# Patient Record
Sex: Male | Born: 1940 | Race: White | Hispanic: No | Marital: Single | Smoking: Former smoker
Health system: Southern US, Community
[De-identification: ages and names within clinical notes are randomized; demographics above are authoritative.]

## PROBLEM LIST (undated history)

## (undated) DIAGNOSIS — J9611 Chronic respiratory failure with hypoxia: Secondary | ICD-10-CM

## (undated) DIAGNOSIS — I5032 Chronic diastolic (congestive) heart failure: Secondary | ICD-10-CM

## (undated) DIAGNOSIS — I2781 Cor pulmonale (chronic): Secondary | ICD-10-CM

## (undated) DIAGNOSIS — G5601 Carpal tunnel syndrome, right upper limb: Secondary | ICD-10-CM

## (undated) DIAGNOSIS — K5909 Other constipation: Secondary | ICD-10-CM

## (undated) DIAGNOSIS — H02839 Dermatochalasis of unspecified eye, unspecified eyelid: Secondary | ICD-10-CM

## (undated) DIAGNOSIS — M79606 Pain in leg, unspecified: Secondary | ICD-10-CM

## (undated) DIAGNOSIS — E876 Hypokalemia: Secondary | ICD-10-CM

## (undated) DIAGNOSIS — R635 Abnormal weight gain: Secondary | ICD-10-CM

## (undated) DIAGNOSIS — M6281 Muscle weakness (generalized): Secondary | ICD-10-CM

## (undated) DIAGNOSIS — I272 Pulmonary hypertension, unspecified: Secondary | ICD-10-CM

## (undated) DIAGNOSIS — G2581 Restless legs syndrome: Secondary | ICD-10-CM

## (undated) DIAGNOSIS — G4733 Obstructive sleep apnea (adult) (pediatric): Secondary | ICD-10-CM

## (undated) DIAGNOSIS — I890 Lymphedema, not elsewhere classified: Secondary | ICD-10-CM

## (undated) DIAGNOSIS — H5203 Hypermetropia, bilateral: Secondary | ICD-10-CM

## (undated) DIAGNOSIS — E559 Vitamin D deficiency, unspecified: Secondary | ICD-10-CM

## (undated) DIAGNOSIS — R269 Unspecified abnormalities of gait and mobility: Secondary | ICD-10-CM

## (undated) DIAGNOSIS — I110 Hypertensive heart disease with heart failure: Secondary | ICD-10-CM

## (undated) DIAGNOSIS — K567 Ileus, unspecified: Secondary | ICD-10-CM

## (undated) DIAGNOSIS — Z89421 Acquired absence of other right toe(s): Secondary | ICD-10-CM

## (undated) DIAGNOSIS — H43812 Vitreous degeneration, left eye: Secondary | ICD-10-CM

## (undated) DIAGNOSIS — I872 Venous insufficiency (chronic) (peripheral): Secondary | ICD-10-CM

## (undated) DIAGNOSIS — E785 Hyperlipidemia, unspecified: Secondary | ICD-10-CM

## (undated) DIAGNOSIS — I350 Nonrheumatic aortic (valve) stenosis: Secondary | ICD-10-CM

## (undated) DIAGNOSIS — R54 Age-related physical debility: Secondary | ICD-10-CM

## (undated) DIAGNOSIS — F339 Major depressive disorder, recurrent, unspecified: Secondary | ICD-10-CM

## (undated) DIAGNOSIS — L821 Other seborrheic keratosis: Secondary | ICD-10-CM

## (undated) DIAGNOSIS — M1990 Unspecified osteoarthritis, unspecified site: Secondary | ICD-10-CM

## (undated) DIAGNOSIS — I480 Paroxysmal atrial fibrillation: Secondary | ICD-10-CM

## (undated) DIAGNOSIS — Z87891 Personal history of nicotine dependence: Secondary | ICD-10-CM

## (undated) DIAGNOSIS — H25813 Combined forms of age-related cataract, bilateral: Secondary | ICD-10-CM

## (undated) DIAGNOSIS — E039 Hypothyroidism, unspecified: Secondary | ICD-10-CM

## (undated) DIAGNOSIS — E662 Morbid (severe) obesity with alveolar hypoventilation: Secondary | ICD-10-CM

## (undated) HISTORY — DX: Nonrheumatic aortic (valve) stenosis: I35.0

## (undated) HISTORY — DX: Lymphedema, not elsewhere classified: I89.0

---

## 2007-04-24 DIAGNOSIS — N400 Enlarged prostate without lower urinary tract symptoms: Secondary | ICD-10-CM | POA: Insufficient documentation

## 2008-08-08 DIAGNOSIS — I10 Essential (primary) hypertension: Secondary | ICD-10-CM | POA: Insufficient documentation

## 2009-05-27 DIAGNOSIS — K219 Gastro-esophageal reflux disease without esophagitis: Secondary | ICD-10-CM | POA: Insufficient documentation

## 2012-01-26 DIAGNOSIS — G4733 Obstructive sleep apnea (adult) (pediatric): Secondary | ICD-10-CM | POA: Insufficient documentation

## 2012-09-18 DIAGNOSIS — M179 Osteoarthritis of knee, unspecified: Secondary | ICD-10-CM | POA: Insufficient documentation

## 2013-01-19 DIAGNOSIS — M1611 Unilateral primary osteoarthritis, right hip: Secondary | ICD-10-CM | POA: Insufficient documentation

## 2015-08-15 DIAGNOSIS — D472 Monoclonal gammopathy: Secondary | ICD-10-CM | POA: Insufficient documentation

## 2015-10-29 DIAGNOSIS — I2510 Atherosclerotic heart disease of native coronary artery without angina pectoris: Secondary | ICD-10-CM | POA: Insufficient documentation

## 2016-04-19 DIAGNOSIS — I83019 Varicose veins of right lower extremity with ulcer of unspecified site: Secondary | ICD-10-CM | POA: Insufficient documentation

## 2016-05-01 DIAGNOSIS — I452 Bifascicular block: Secondary | ICD-10-CM | POA: Insufficient documentation

## 2016-05-02 DIAGNOSIS — I872 Venous insufficiency (chronic) (peripheral): Secondary | ICD-10-CM | POA: Insufficient documentation

## 2016-11-09 DIAGNOSIS — G8929 Other chronic pain: Secondary | ICD-10-CM | POA: Insufficient documentation

## 2017-03-06 DIAGNOSIS — E662 Morbid (severe) obesity with alveolar hypoventilation: Secondary | ICD-10-CM | POA: Insufficient documentation

## 2017-09-23 DIAGNOSIS — R5381 Other malaise: Secondary | ICD-10-CM | POA: Insufficient documentation

## 2018-04-15 DIAGNOSIS — I272 Pulmonary hypertension, unspecified: Secondary | ICD-10-CM | POA: Insufficient documentation

## 2018-12-19 DIAGNOSIS — G5601 Carpal tunnel syndrome, right upper limb: Secondary | ICD-10-CM | POA: Insufficient documentation

## 2019-02-16 DIAGNOSIS — I2729 Other secondary pulmonary hypertension: Secondary | ICD-10-CM | POA: Insufficient documentation

## 2020-10-01 ENCOUNTER — Other Ambulatory Visit: Payer: Self-pay

## 2020-10-01 ENCOUNTER — Encounter (HOSPITAL_COMMUNITY): Payer: Self-pay | Admitting: *Deleted

## 2020-10-01 ENCOUNTER — Emergency Department (HOSPITAL_COMMUNITY): Payer: Medicare Other

## 2020-10-01 ENCOUNTER — Inpatient Hospital Stay (HOSPITAL_COMMUNITY)
Admission: EM | Admit: 2020-10-01 | Discharge: 2020-10-03 | DRG: 291 | Disposition: A | Discharge status: Skilled Nursing Facility | Payer: Medicare Other | Source: Skilled Nursing Facility | Attending: Family Medicine | Admitting: Family Medicine

## 2020-10-01 DIAGNOSIS — M79659 Pain in unspecified thigh: Secondary | ICD-10-CM

## 2020-10-01 DIAGNOSIS — M79651 Pain in right thigh: Secondary | ICD-10-CM

## 2020-10-01 DIAGNOSIS — R1031 Right lower quadrant pain: Secondary | ICD-10-CM

## 2020-10-01 DIAGNOSIS — R0902 Hypoxemia: Secondary | ICD-10-CM

## 2020-10-01 DIAGNOSIS — N179 Acute kidney failure, unspecified: Secondary | ICD-10-CM

## 2020-10-01 DIAGNOSIS — Z9981 Dependence on supplemental oxygen: Secondary | ICD-10-CM

## 2020-10-01 DIAGNOSIS — Z66 Do not resuscitate: Secondary | ICD-10-CM | POA: Diagnosis present

## 2020-10-01 DIAGNOSIS — J9621 Acute and chronic respiratory failure with hypoxia: Secondary | ICD-10-CM | POA: Diagnosis present

## 2020-10-01 DIAGNOSIS — E785 Hyperlipidemia, unspecified: Secondary | ICD-10-CM

## 2020-10-01 DIAGNOSIS — M7989 Other specified soft tissue disorders: Secondary | ICD-10-CM

## 2020-10-01 DIAGNOSIS — G2581 Restless legs syndrome: Secondary | ICD-10-CM | POA: Diagnosis present

## 2020-10-01 DIAGNOSIS — E039 Hypothyroidism, unspecified: Secondary | ICD-10-CM

## 2020-10-01 DIAGNOSIS — I2729 Other secondary pulmonary hypertension: Secondary | ICD-10-CM | POA: Diagnosis present

## 2020-10-01 DIAGNOSIS — K5900 Constipation, unspecified: Secondary | ICD-10-CM | POA: Diagnosis present

## 2020-10-01 DIAGNOSIS — E782 Mixed hyperlipidemia: Secondary | ICD-10-CM

## 2020-10-01 DIAGNOSIS — I872 Venous insufficiency (chronic) (peripheral): Secondary | ICD-10-CM

## 2020-10-01 DIAGNOSIS — J90 Pleural effusion, not elsewhere classified: Secondary | ICD-10-CM

## 2020-10-01 DIAGNOSIS — I350 Nonrheumatic aortic (valve) stenosis: Secondary | ICD-10-CM | POA: Diagnosis present

## 2020-10-01 DIAGNOSIS — M1611 Unilateral primary osteoarthritis, right hip: Secondary | ICD-10-CM | POA: Diagnosis present

## 2020-10-01 DIAGNOSIS — I5033 Acute on chronic diastolic (congestive) heart failure: Secondary | ICD-10-CM | POA: Diagnosis present

## 2020-10-01 DIAGNOSIS — I110 Hypertensive heart disease with heart failure: Principal | ICD-10-CM | POA: Diagnosis present

## 2020-10-01 DIAGNOSIS — I509 Heart failure, unspecified: Secondary | ICD-10-CM | POA: Diagnosis not present

## 2020-10-01 DIAGNOSIS — I480 Paroxysmal atrial fibrillation: Secondary | ICD-10-CM | POA: Diagnosis present

## 2020-10-01 DIAGNOSIS — Z20822 Contact with and (suspected) exposure to covid-19: Secondary | ICD-10-CM | POA: Diagnosis present

## 2020-10-01 DIAGNOSIS — E66813 Obesity, class 3: Secondary | ICD-10-CM

## 2020-10-01 DIAGNOSIS — D539 Nutritional anemia, unspecified: Secondary | ICD-10-CM

## 2020-10-01 HISTORY — DX: Other constipation: K59.09

## 2020-10-01 HISTORY — DX: Morbid (severe) obesity with alveolar hypoventilation: E66.2

## 2020-10-01 HISTORY — DX: Hypermetropia, bilateral: H52.03

## 2020-10-01 HISTORY — DX: Pulmonary hypertension, unspecified: I27.20

## 2020-10-01 HISTORY — DX: Venous insufficiency (chronic) (peripheral): I87.2

## 2020-10-01 HISTORY — DX: Vitamin D deficiency, unspecified: E55.9

## 2020-10-01 HISTORY — DX: Hypothyroidism, unspecified: E03.9

## 2020-10-01 HISTORY — DX: Hyperlipidemia, unspecified: E78.5

## 2020-10-01 HISTORY — DX: Ileus, unspecified: K56.7

## 2020-10-01 HISTORY — DX: Carpal tunnel syndrome, right upper limb: G56.01

## 2020-10-01 HISTORY — DX: Dermatochalasis of unspecified eye, unspecified eyelid: H02.839

## 2020-10-01 HISTORY — DX: Pain in leg, unspecified: M79.606

## 2020-10-01 HISTORY — DX: Major depressive disorder, recurrent, unspecified: F33.9

## 2020-10-01 HISTORY — DX: Chronic diastolic (congestive) heart failure: I50.32

## 2020-10-01 HISTORY — DX: Paroxysmal atrial fibrillation: I48.0

## 2020-10-01 HISTORY — DX: Restless legs syndrome: G25.81

## 2020-10-01 HISTORY — DX: Hypokalemia: E87.6

## 2020-10-01 HISTORY — DX: Unspecified abnormalities of gait and mobility: R26.9

## 2020-10-01 HISTORY — DX: Age-related physical debility: R54

## 2020-10-01 HISTORY — DX: Personal history of nicotine dependence: Z87.891

## 2020-10-01 HISTORY — DX: Cor pulmonale (chronic): I27.81

## 2020-10-01 HISTORY — DX: Hypertensive heart disease with heart failure: I11.0

## 2020-10-01 HISTORY — DX: Other seborrheic keratosis: L82.1

## 2020-10-01 HISTORY — DX: Vitreous degeneration, left eye: H43.812

## 2020-10-01 HISTORY — DX: Abnormal weight gain: R63.5

## 2020-10-01 HISTORY — DX: Chronic respiratory failure with hypoxia: J96.11

## 2020-10-01 HISTORY — DX: Combined forms of age-related cataract, bilateral: H25.813

## 2020-10-01 HISTORY — DX: Unspecified osteoarthritis, unspecified site: M19.90

## 2020-10-01 HISTORY — DX: Acquired absence of other right toe(s): Z89.421

## 2020-10-01 HISTORY — DX: Muscle weakness (generalized): M62.81

## 2020-10-01 HISTORY — DX: Obstructive sleep apnea (adult) (pediatric): G47.33

## 2020-10-01 LAB — RESP PANEL BY RT-PCR (FLU A&B, COVID) ARPGX2
Influenza A by PCR: NEGATIVE
Influenza B by PCR: NEGATIVE
SARS Coronavirus 2 by RT PCR: NEGATIVE

## 2020-10-01 LAB — COMPREHENSIVE METABOLIC PANEL
ALT: 31 U/L (ref 0–44)
AST: 51 U/L — ABNORMAL HIGH (ref 15–41)
Albumin: 4.1 g/dL (ref 3.5–5.0)
Alkaline Phosphatase: 117 U/L (ref 38–126)
Anion gap: 8 (ref 5–15)
BUN: 35 mg/dL — ABNORMAL HIGH (ref 8–23)
CO2: 40 mmol/L — ABNORMAL HIGH (ref 22–32)
Calcium: 8.9 mg/dL (ref 8.9–10.3)
Chloride: 89 mmol/L — ABNORMAL LOW (ref 98–111)
Creatinine, Ser: 1.45 mg/dL — ABNORMAL HIGH (ref 0.61–1.24)
GFR, Estimated: 48 mL/min — ABNORMAL LOW (ref >60)
Glucose, Bld: 132 mg/dL — ABNORMAL HIGH (ref 70–99)
Potassium: 4.7 mmol/L (ref 3.5–5.1)
Sodium: 137 mmol/L (ref 135–145)
Total Bilirubin: 1.6 mg/dL — ABNORMAL HIGH (ref 0.3–1.2)
Total Protein: 7.3 g/dL (ref 6.5–8.1)

## 2020-10-01 LAB — CBC WITH DIFFERENTIAL/PLATELET
Abs Immature Granulocytes: 0.08 10*3/uL — ABNORMAL HIGH (ref 0.00–0.07)
Basophils Absolute: 0.1 10*3/uL (ref 0.0–0.1)
Basophils Relative: 1 %
Eosinophils Absolute: 0.2 10*3/uL (ref 0.0–0.5)
Eosinophils Relative: 3 %
HCT: 52.8 % — ABNORMAL HIGH (ref 39.0–52.0)
Hemoglobin: 16 g/dL (ref 13.0–17.0)
Immature Granulocytes: 1 %
Lymphocytes Relative: 18 %
Lymphs Abs: 1.6 10*3/uL (ref 0.7–4.0)
MCH: 33.7 pg (ref 26.0–34.0)
MCHC: 30.3 g/dL (ref 30.0–36.0)
MCV: 111.2 fL — ABNORMAL HIGH (ref 80.0–100.0)
Monocytes Absolute: 1.1 10*3/uL — ABNORMAL HIGH (ref 0.1–1.0)
Monocytes Relative: 13 %
Neutro Abs: 5.6 10*3/uL (ref 1.7–7.7)
Neutrophils Relative %: 64 %
Platelets: 162 10*3/uL (ref 150–400)
RBC: 4.75 MIL/uL (ref 4.22–5.81)
RDW: 15 % (ref 11.5–15.5)
WBC: 8.7 10*3/uL (ref 4.0–10.5)
nRBC: 0 % (ref 0.0–0.2)

## 2020-10-01 LAB — BRAIN NATRIURETIC PEPTIDE: B Natriuretic Peptide: 29 pg/mL (ref 0.0–100.0)

## 2020-10-01 LAB — LIPASE, BLOOD: Lipase: 28 U/L (ref 11–51)

## 2020-10-01 MED ORDER — ONDANSETRON HCL 4 MG/2ML IJ SOLN: 4.0000 mg | Freq: Four times a day (QID) | INTRAMUSCULAR | Status: DC | PRN

## 2020-10-01 MED ORDER — ENOXAPARIN SODIUM 40 MG/0.4ML IJ SOSY
40.0000 mg | PREFILLED_SYRINGE | INTRAMUSCULAR | Status: DC
  Administered 2020-10-01 – 2020-10-02 (×2): 40 mg via SUBCUTANEOUS
  Filled 2020-10-01 (×2): qty 0.4

## 2020-10-01 MED ORDER — POTASSIUM CHLORIDE CRYS ER 20 MEQ PO TBCR
20.0000 meq | EXTENDED_RELEASE_TABLET | Freq: Two times a day (BID) | ORAL | Status: DC
  Administered 2020-10-01 – 2020-10-03 (×4): 20 meq via ORAL
  Filled 2020-10-01 (×4): qty 1

## 2020-10-01 MED ORDER — LEVOTHYROXINE SODIUM 100 MCG PO TABS
100.0000 ug | ORAL_TABLET | Freq: Every day | ORAL | Status: DC
  Administered 2020-10-02 – 2020-10-03 (×2): 100 ug via ORAL
  Filled 2020-10-01 (×2): qty 1

## 2020-10-01 MED ORDER — LACTULOSE 10 GM/15ML PO SOLN: 10.0000 g | Freq: Every day | ORAL | Status: DC | PRN

## 2020-10-01 MED ORDER — HYDROCODONE-ACETAMINOPHEN 5-325 MG PO TABS
1.0000 | ORAL_TABLET | Freq: Four times a day (QID) | ORAL | Status: DC | PRN
  Administered 2020-10-01 – 2020-10-03 (×5): 1 via ORAL
  Filled 2020-10-01 (×5): qty 1

## 2020-10-01 MED ORDER — ATORVASTATIN CALCIUM 20 MG PO TABS
20.0000 mg | ORAL_TABLET | Freq: Every day | ORAL | Status: DC
  Administered 2020-10-02 – 2020-10-03 (×2): 20 mg via ORAL
  Filled 2020-10-01 (×2): qty 1

## 2020-10-01 MED ORDER — FUROSEMIDE 10 MG/ML IJ SOLN
40.0000 mg | Freq: Once | INTRAMUSCULAR | Status: AC
  Administered 2020-10-01: 40 mg via INTRAVENOUS
  Filled 2020-10-01: qty 4

## 2020-10-01 MED ORDER — POLYETHYLENE GLYCOL 3350 17 G PO PACK
17.0000 g | PACK | Freq: Every day | ORAL | Status: DC
  Administered 2020-10-02 – 2020-10-03 (×2): 17 g via ORAL
  Filled 2020-10-01 (×2): qty 1

## 2020-10-01 MED ORDER — PRAMIPEXOLE DIHYDROCHLORIDE 1 MG PO TABS
1.0000 mg | ORAL_TABLET | Freq: Every day | ORAL | Status: DC
  Administered 2020-10-01 – 2020-10-02 (×2): 1 mg via ORAL
  Filled 2020-10-01 (×2): qty 1

## 2020-10-01 MED ORDER — VITAMIN D 25 MCG (1000 UNIT) PO TABS
1000.0000 [IU] | ORAL_TABLET | Freq: Every day | ORAL | Status: DC
  Administered 2020-10-02 – 2020-10-03 (×2): 1000 [IU] via ORAL
  Filled 2020-10-01 (×2): qty 1

## 2020-10-01 NOTE — ED Notes (Signed)
Facility called to check on pt

## 2020-10-01 NOTE — ED Notes (Signed)
Updated nursing facility about admission

## 2020-10-01 NOTE — ED Notes (Signed)
Pt has yellow DNR at bedside

## 2020-10-01 NOTE — ED Triage Notes (Signed)
Pt brought in by [facility] from [facility] for increased need for O2; staff reported to ems pt was vomiting all day yesterday and today he has had to go from 2L of O2 to 4L; pt's only complaint is right groin pain

## 2020-10-01 NOTE — H&P (Signed)
History and Physical  Vernon Campbell PXT:[id number] DOB: [birth year] DOA: 10/01/2020  Referring physician: Maia Plan, MD PCP: Pcp, No  Patient coming from: [facility]  Chief Complaint: Shortness of breath  HPI: Vernon Campbell is an 80 y.o. 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  Patient was reported to have had unwitnessed vomiting within the last 24 hours and noted to be requiring more oxygen to maintain normal O2 sats.  He endorsed right thigh/groin pain which worsens with ambulation.  Patient denies chest pain, fever, chills, abdominal pain or diarrhea.  ED Course: In the emergency department, he was hemodynamically stable, though BP was soft at 104/62.  Work-up in the ED showed macrocytic anemia, BUN/creatinine 35/1.45 (no prior labs for comparison).  BNP 29.0, lipase 28.  Influenza A, B, SARS coronavirus 2 was negative. Pelvic x-ray showed moderate bilateral hip joint space narrowing but no acute bony findings. Chest x-ray showed Cardiac enlargement with vascular congestion and left pleural effusion with overlying atelectasis. IV Lasix 40 mg x 1 was given.  Hospitalist was asked to admit patient for further evaluation and management.  Review of Systems: Constitutional: Negative for chills and fever.  HENT: Negative for ear pain and sore throat.   Eyes: Negative for pain and visual disturbance.  Respiratory: Positive for shortness of breath.  Negative for cough, chest tightness Cardiovascular: Negative for chest pain and palpitations.  Gastrointestinal: Negative for abdominal pain and vomiting.  Endocrine: Negative for polyphagia and polyuria.  Genitourinary: Negative for decreased urine volume, dysuria, enuresis Musculoskeletal: Positive for right thigh and groin  pain.  Negative for back pain.  Skin: Positive for bilateral lower extremity swelling and redness. Allergic/Immunologic: Negative for immunocompromised state.  Neurological: Negative for tremors, syncope, speech difficulty Hematological: Does not bruise/bleed easily.  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 No pertinent surgical history.  Social History:  reports that he has quit smoking. His smoking use included cigarettes. He has never used smokeless tobacco. He reports previous alcohol use. He reports previous drug  use.   No Known Allergies  History reviewed. No pertinent family history.    Prior to Admission medications   Not on File    Physical Exam: BP 108/81 (BP Location: Left Arm)   Pulse 74   Temp 98 F (36.7 C)   Resp 19   Ht 5\' 9"   (1.753 m)   Wt 136.1 kg   SpO2 93%   BMI 44.30 kg/m   General: 80 y.o. year-old male well developed well nourished in no acute distress.  Alert and oriented x3. HEENT: NCAT, EOMI Neck: Supple, trachea medial Cardiovascular: Regular rate and rhythm with no rubs or gallops.  No thyromegaly or JVD noted.  2/4 pulses in all 4 extremities. Respiratory: Rales auscultated in left lower lobe.  No wheezes.  Abdomen: Soft, nontender nondistended with normal bowel sounds x4 quadrants. Muskuloskeletal: No cyanosis, bilateral lower extremity edema with venous stasis.  Tender to palpation of right thigh and groin.  Neuro: CN II-XII intact, strength 5/5 x 4, sensation, reflexes intact Skin: No ulcerative lesions noted or rashes Psychiatry: Mood is appropriate for condition and setting          Labs on Admission:  Basic Metabolic Panel: Recent Labs  Lab 10/01/20 1757  NA 137  K 4.7  CL 89*  CO2 40*  GLUCOSE 132*  BUN 35*  CREATININE 1.45*  CALCIUM 8.9   Liver Function Tests: Recent Labs  Lab 10/01/20 1757  AST 51*  ALT 31  ALKPHOS 117  BILITOT 1.6*  PROT 7.3  ALBUMIN 4.1   Recent Labs  Lab 10/01/20 1757  LIPASE 28   No results for input(s): AMMONIA in the last 168 hours. CBC: Recent Labs  Lab 10/01/20 1757  WBC 8.7  NEUTROABS 5.6  HGB 16.0  HCT 52.8*  MCV 111.2*  PLT 162   Cardiac Enzymes: No results for input(s): CKTOTAL, CKMB, CKMBINDEX, TROPONINI in the last 168 hours.  BNP (last 3 results) Recent Labs    10/01/20 1757  BNP 29.0    ProBNP (last 3 results) No results for input(s): PROBNP in the last 8760 hours.  CBG: No results for input(s): GLUCAP in the last 168 hours.  Radiological Exams on Admission: DG Pelvis Portable  Result Date: 10/01/2020 CLINICAL DATA:  Right groin pain. EXAM: PORTABLE PELVIS 1-2 VIEWS COMPARISON:  None. FINDINGS: Both hips are normally located. Moderate joint space narrowing. No fracture or bone lesion. The pubic symphysis  and SI joints are intact. No pelvic fractures or bone lesions. Moderate stool noted throughout the colon and down into the rectum could suggest constipation. IMPRESSION: Moderate bilateral hip joint space narrowing but no acute bony findings. Electronically Signed   By: 10/03/2020 M.D.   On: 10/01/2020 18:59   DG Chest Portable 1 View  Result Date: 10/01/2020 CLINICAL DATA:  Shortness of breath EXAM: PORTABLE CHEST 1 VIEW COMPARISON:  None. FINDINGS: The heart is enlarged. Mild tortuosity of the thoracic aorta. Moderate central vascular congestion without overt pulmonary edema. There is a left pleural effusion suspected and overlying atelectasis. IMPRESSION: Cardiac enlargement with vascular congestion and left pleural effusion with overlying atelectasis. Electronically Signed   By: 10/03/2020 M.D.   On: 10/01/2020 18:58    EKG: I independently viewed the EKG done and my findings are as followed: Normal sinus rhythm at a rate of 68 bpm and bifascicular block  Assessment/Plan Present on Admission: **None**  Principal Problem:   Acute exacerbation of CHF (congestive heart failure) (HCC) Active Problems:  Hyperlipidemia   Hypothyroidism   Macrocytic anemia   AKI (acute kidney injury) (HCC)   Obesity, Class III, BMI 40-49.9 (morbid obesity) (HCC)   Acute on chronic respiratory failure with hypoxia (HCC)   Right thigh pain   Right groin pain   Chronic stasis dermatitis  Acute on chronic respiratory failure with hypoxia possibly secondary to acute exacerbation of CHF Continue total input/output, daily weights and fluid restriction Continue IV Lasix 40 twice daily (as tolerated by BP) Continue KCl Continue Cardiac diet  Echocardiogram in the morning  Continue supplemental oxygen with plan to wean patient down to baseline oxygen requirement  Right thigh/groin pain Continue Norco per home regimen Ultrasound will be done in the morning to rule out DVT  Macrocytic anemia MCV 111.2;  folate and vitamin B12 levels will be checked  Acute kidney injury with no known history of CKD BUN/creatinine 35/1.45 (no prior labs for comparison) Renally adjust medications, avoid nephrotoxic agents/dehydration/hypotension  Vomiting Continue Zofran as needed  Hyperlipidemia Continue Lipitor  Hypothyroidism Continue Synthroid  Chronic stasis dermatitis Continue to monitor  Restless leg syndrome Continue pramipexole  Constipation Continue lactulose, MiraLAX  Obesity class III (BMI 44.30) Will be counseled on diet and lifestyle modification when more stable. Patient will need to follow-up with outpatient PCP for weight loss program  Other home meds: Vitamin D3  DVT prophylaxis: Lovenox  Code Status: DNR  Family Communication: None at bedside  Disposition Plan:  Patient is from:                        home Anticipated DC to:                   SNF or family members home Anticipated DC date:               2-3 days Anticipated DC barriers:          Patient requires inpatient for treatment of CHF exacerbation  Consults called: None  Admission status: Inpatient    Frankey Shown MD [facility]  10/01/2020, 10:58 PM

## 2020-10-01 NOTE — ED Notes (Signed)
Linen changed

## 2020-10-01 NOTE — ED Notes (Signed)
Gave pt urinal 

## 2020-10-01 NOTE — ED Provider Notes (Signed)
Emergency Department Provider Note   I have reviewed the triage vital signs and the nursing notes.   HISTORY  Chief Complaint Groin Pain   HPI Vernon Campbell is a 80 y.o. male with complicated past medical history reviewed below presents to the emergency department from [facility] with increased oxygen requirement. He has had vomiting for the last 24 hours with no witness choking/coughing episode but O2 has been lower. Patient denies any CP or SOB symptoms. He complains of some right groin pain worse with movement but is still able to ambulate. Denies abdominal pain. No fever or chills. Has been compliant with home medications.    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)    Hypokalemia    Hypothyroidism, adult    Ileus (HCC)    Leg pain, diffuse    Muscle weakness (generalized)    Obstructive sleep apnea syndrome    Osteoarthritis, unspecified osteoarthritis type, unspecified site    Other seborrheic keratosis    Paroxysmal atrial fibrillation (HCC)    Peripheral venous insufficiency    Pulmonary hypertension (HCC)    Recurrent major depression (HCC)    Restless leg syndrome    Vitamin D deficiency, unspecified    Vitreous degeneration of left eye     Patient Active Problem List   Diagnosis Date Noted   Acute exacerbation of CHF (congestive heart failure) (HCC) 10/01/2020   Hyperlipidemia  10/01/2020   Hypothyroidism 10/01/2020   Macrocytic anemia 10/01/2020   AKI (acute kidney injury) (HCC) 10/01/2020   Obesity, Class III, BMI 40-49.9 (morbid obesity) (HCC) 10/01/2020   Acute on chronic respiratory failure with hypoxia (HCC) 10/01/2020   Right thigh pain 10/01/2020   Right groin pain 10/01/2020   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 No pertinent surgical history.  Allergies Patient has no known allergies.  History reviewed. No pertinent family history.  Social History Social History   Tobacco Use   Smoking status: Former    Types: Cigarettes   Smokeless tobacco: Never  Vaping Use   Vaping Use: Never used  Substance Use Topics   Alcohol use: Not Currently   Drug use: Not Currently    Review of Systems  Constitutional: No fever/chills Eyes: No visual changes. ENT: No sore throat. Cardiovascular: Denies chest pain. Respiratory: Positive shortness of breath. Gastrointestinal: No abdominal pain.  No nausea, no vomiting.  No diarrhea.  No constipation. Genitourinary: Negative for dysuria. Musculoskeletal: Negative for back pain. Skin: Negative for rash. Neurological: Negative for headaches, focal weakness or numbness  10-point ROS otherwise negative.  ____________________________________________   PHYSICAL EXAM:  VITAL SIGNS: ED Triage Vitals  Enc Vitals Group     BP 10/01/20 1800 104/62     Pulse Rate 10/01/20 1800 66     Resp 10/01/20 1800 20     Temp  10/01/20 1802 98.7 F (37.1 C)     Temp Source 10/01/20 1802 Oral     SpO2 10/01/20 1800 98 %     Weight 10/01/20 1757 300 lb (136.1 kg)     Height 10/01/20 1757 5\' 9"  (1.753 m)    Constitutional: Alert and oriented. Well appearing and in no acute distress. Eyes: Conjunctivae are normal.  Head: Atraumatic. Nose: No congestion/rhinnorhea. Mouth/Throat: Mucous membranes are moist.  Neck: No stridor.   Cardiovascular: Normal rate, regular rhythm. Good peripheral  circulation. Grossly normal heart sounds.   Respiratory: Normal respiratory effort.  No retractions. Lungs CTAB. Gastrointestinal: Soft and nontender. No distention.  Musculoskeletal: No lower extremity tenderness nor edema. No gross deformities of extremities. Normal ROM of the bilateral hips and knees.  Neurologic:  Normal speech and language. No gross focal neurologic deficits are appreciated.  Skin:  Skin is warm, dry and intact. No rash noted.   ____________________________________________   LABS (all labs ordered are listed, but only abnormal results are displayed)  Labs Reviewed  COMPREHENSIVE METABOLIC PANEL - Abnormal; Notable for the following components:      Result Value   Chloride 89 (*)    CO2 40 (*)    Glucose, Bld 132 (*)    BUN 35 (*)    Creatinine, Ser 1.45 (*)    AST 51 (*)    Total Bilirubin 1.6 (*)    GFR, Estimated 48 (*)    All other components within normal limits  CBC WITH DIFFERENTIAL/PLATELET - Abnormal; Notable for the following components:   HCT 52.8 (*)    MCV 111.2 (*)    Monocytes Absolute 1.1 (*)    Abs Immature Granulocytes 0.08 (*)    All other components within normal limits  COMPREHENSIVE METABOLIC PANEL - Abnormal; Notable for the following components:   Chloride 90 (*)    CO2 40 (*)    Glucose, Bld 110 (*)    BUN 32 (*)    Creatinine, Ser 1.28 (*)    AST 46 (*)    Total Bilirubin 1.9 (*)    GFR, Estimated 56 (*)    All other components within normal limits  CBC - Abnormal; Notable for the following components:   HCT 53.3 (*)    MCV 111.0 (*)    All other components within normal limits  APTT - Abnormal; Notable for the following components:   aPTT 39 (*)    All other components within normal limits  BASIC METABOLIC PANEL - Abnormal; Notable for the following components:   Chloride 89 (*)    CO2 41 (*)    BUN 29 (*)    Creatinine, Ser 1.28 (*)    Calcium 8.8 (*)    GFR, Estimated 56 (*)    All other components within normal  limits  RESP PANEL BY RT-PCR (FLU A&B, COVID) ARPGX2  BRAIN NATRIURETIC PEPTIDE  LIPASE, BLOOD  PROTIME-INR  MAGNESIUM  PHOSPHORUS  FOLATE  VITAMIN B12   ____________________________________________  EKG  EKG reviewed. No change from prior.    ____________________________________________  RADIOLOGY   DG pelvis and CXR reviewed.  ____________________________________________   PROCEDURES  Procedure(s) performed:   Procedures  None ____________________________________________   INITIAL IMPRESSION / ASSESSMENT AND PLAN / ED COURSE  Pertinent labs & imaging results that were available during my care of the patient were reviewed by me and considered in my medical decision making (see chart for details).   Patient presents to the right increased O2 requirement  and report of vomiting. Patient complaining only of hip pain. No abd pain or tenderness on exam. Imaging and labs reviewed. Plan for lasix and admit.   Differential includes CHF, ACS, PE, aspirations PNA.   Discussed patient's case with [facility] to request admission. Patient and family (if present) updated with plan. Care transferred to [facility] service.  I reviewed all nursing notes, vitals, pertinent old records, EKGs, labs, imaging (as available).    ____________________________________________  FINAL CLINICAL IMPRESSION(S) / ED DIAGNOSES  Final diagnoses:  Hypoxemia  Pleural effusion     MEDICATIONS GIVEN DURING THIS VISIT:  Medications  furosemide (LASIX) injection 40 mg (40 mg Intravenous Given 10/01/20 2102)  melatonin tablet 6 mg (6 mg Oral Given 10/02/20 2103)      Note:  This document was prepared using Dragon voice recognition software and may include unintentional dictation errors.  Alona Bene, MD, [facility] Emergency Medicine    Nezar Buckles, Arlyss Repress, MD 10/06/20 2145

## 2020-10-02 ENCOUNTER — Inpatient Hospital Stay (HOSPITAL_COMMUNITY): Payer: Medicare Other

## 2020-10-02 DIAGNOSIS — J9621 Acute and chronic respiratory failure with hypoxia: Secondary | ICD-10-CM

## 2020-10-02 DIAGNOSIS — I509 Heart failure, unspecified: Secondary | ICD-10-CM

## 2020-10-02 DIAGNOSIS — I872 Venous insufficiency (chronic) (peripheral): Secondary | ICD-10-CM | POA: Diagnosis not present

## 2020-10-02 DIAGNOSIS — N179 Acute kidney failure, unspecified: Secondary | ICD-10-CM | POA: Diagnosis not present

## 2020-10-02 LAB — COMPREHENSIVE METABOLIC PANEL
ALT: 30 U/L (ref 0–44)
AST: 46 U/L — ABNORMAL HIGH (ref 15–41)
Albumin: 4 g/dL (ref 3.5–5.0)
Alkaline Phosphatase: 103 U/L (ref 38–126)
Anion gap: 10 (ref 5–15)
BUN: 32 mg/dL — ABNORMAL HIGH (ref 8–23)
CO2: 40 mmol/L — ABNORMAL HIGH (ref 22–32)
Calcium: 9 mg/dL (ref 8.9–10.3)
Chloride: 90 mmol/L — ABNORMAL LOW (ref 98–111)
Creatinine, Ser: 1.28 mg/dL — ABNORMAL HIGH (ref 0.61–1.24)
GFR, Estimated: 56 mL/min — ABNORMAL LOW (ref >60)
Glucose, Bld: 110 mg/dL — ABNORMAL HIGH (ref 70–99)
Potassium: 4.7 mmol/L (ref 3.5–5.1)
Sodium: 140 mmol/L (ref 135–145)
Total Bilirubin: 1.9 mg/dL — ABNORMAL HIGH (ref 0.3–1.2)
Total Protein: 7 g/dL (ref 6.5–8.1)

## 2020-10-02 LAB — PROTIME-INR
INR: 1.1 (ref 0.8–1.2)
Prothrombin Time: 13.9 seconds (ref 11.4–15.2)

## 2020-10-02 LAB — ECHOCARDIOGRAM COMPLETE
AR max vel: 1.28 cm2
AV Area VTI: 1.49 cm2
AV Area mean vel: 1.33 cm2
AV Mean grad: 17.2 mmHg
AV Peak grad: 27.6 mmHg
Ao pk vel: 2.63 m/s
Area-P 1/2: 2.56 cm2
Height: 69 in
MV VTI: 3.69 cm2
S' Lateral: 3.5 cm
Weight: 4768.99 oz

## 2020-10-02 LAB — CBC
HCT: 53.3 % — ABNORMAL HIGH (ref 39.0–52.0)
Hemoglobin: 16.2 g/dL (ref 13.0–17.0)
MCH: 33.8 pg (ref 26.0–34.0)
MCHC: 30.4 g/dL (ref 30.0–36.0)
MCV: 111 fL — ABNORMAL HIGH (ref 80.0–100.0)
Platelets: 163 10*3/uL (ref 150–400)
RBC: 4.8 MIL/uL (ref 4.22–5.81)
RDW: 14.7 % (ref 11.5–15.5)
WBC: 7.8 10*3/uL (ref 4.0–10.5)
nRBC: 0 % (ref 0.0–0.2)

## 2020-10-02 LAB — VITAMIN B12: Vitamin B-12: 366 pg/mL (ref 180–914)

## 2020-10-02 LAB — PHOSPHORUS: Phosphorus: 4.2 mg/dL (ref 2.5–4.6)

## 2020-10-02 LAB — FOLATE: Folate: 10.2 ng/mL (ref >5.9)

## 2020-10-02 LAB — MAGNESIUM: Magnesium: 2.2 mg/dL (ref 1.7–2.4)

## 2020-10-02 LAB — APTT: aPTT: 39 seconds — ABNORMAL HIGH (ref 24–36)

## 2020-10-02 MED ORDER — PHENOL 1.4 % MT LIQD
1.0000 | OROMUCOSAL | Status: DC | PRN
  Administered 2020-10-03: 1 via OROMUCOSAL
  Filled 2020-10-02: qty 177

## 2020-10-02 MED ORDER — FUROSEMIDE 10 MG/ML IJ SOLN: 40.0000 mg | Freq: Two times a day (BID) | INTRAMUSCULAR | Status: DC

## 2020-10-02 MED ORDER — MELATONIN 3 MG PO TABS
6.0000 mg | ORAL_TABLET | Freq: Once | ORAL | Status: AC
  Administered 2020-10-02: 6 mg via ORAL
  Filled 2020-10-02: qty 2

## 2020-10-02 MED ORDER — FUROSEMIDE 10 MG/ML IJ SOLN
60.0000 mg | Freq: Two times a day (BID) | INTRAMUSCULAR | Status: DC
  Administered 2020-10-02 – 2020-10-03 (×2): 60 mg via INTRAVENOUS
  Filled 2020-10-02 (×2): qty 6

## 2020-10-02 MED ORDER — SPIRONOLACTONE 25 MG PO TABS
25.0000 mg | ORAL_TABLET | Freq: Every day | ORAL | Status: DC
  Administered 2020-10-02 – 2020-10-03 (×2): 25 mg via ORAL
  Filled 2020-10-02 (×2): qty 1

## 2020-10-02 MED ORDER — SENNOSIDES-DOCUSATE SODIUM 8.6-50 MG PO TABS
2.0000 | ORAL_TABLET | Freq: Every day | ORAL | Status: DC
  Administered 2020-10-02: 2 via ORAL
  Filled 2020-10-02: qty 2

## 2020-10-02 NOTE — Progress Notes (Signed)
PROGRESS NOTE  Vernon MachoJames Dundon  ZOX:[id number] DOB: [birth year] DOA: 10/01/2020 PCP: Pcp, No   Brief Narrative: Vernon Campbell is an 80 y.o. male with a history of 2L O2-dependent resp failure, HLD, CHF, hypothyroidism and morbid obesity [facility].  In the emergency department, he was hemodynamically stable, though BP was soft at 104/62.  Work-up in the ED showed macrocytic anemia, BUN/creatinine 35/1.45 (no prior labs for comparison).  BNP 29.0, lipase 28.  Influenza A, B, SARS coronavirus 2 was negative. Pt reported right hip pain. Pelvic x-ray showed moderate bilateral hip joint space narrowing but no acute bony findings. Chest x-ray showed Cardiac enlargement with vascular congestion and left pleural effusion with overlying atelectasis. IV Lasix 40 mg x 1 was given.  Hospitalist was asked to admit patient for further evaluation and management.  Assessment & Plan: Principal Problem:   Acute exacerbation of CHF (congestive heart failure) (HCC) Active Problems:   Hyperlipidemia   Hypothyroidism   Macrocytic anemia   AKI (acute kidney injury) (HCC)   Obesity, Class III, BMI 40-49.9 (morbid obesity) (HCC)   Acute on chronic respiratory failure with hypoxia (HCC)   Right thigh pain   Right groin pain   Chronic stasis dermatitis  Acute on chronic hypoxic respiratory failure due to acute on chronic HFpEF: Echo shows LVEF 55-60%, mod LVH, G1DD, mild-mod stenosis of severely thickened/calcified AV.   - Wean oxygen to home baseline as able.  - Continue lasix increase to 60mg  IV BID with limited diuresis from 40mg  dose and home dose of torsemide 40mg  daily. Continue home spironolactone. Hold diltiazem with 1st deg heart block/NSR and normotension.  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  - Symptomatic management.   AKI: Unclear  baseline. Improved with diuresis thus far.  - Monitor with diuresis  HLD:  - Continue statin  Hypothyroidism:  - Continue synthroid  RLS:  - Continue mirapex  Morbid obesity: Estimated body mass index is 44.02 kg/m as calculated from the following:   Height as of this encounter: 5\' 9"  (1.753 m).   Weight as of this encounter: 135.2 kg.  DVT prophylaxis: Lovenox Code Status: DNR Family Communication: Called son without answer, VM not set up. Disposition Plan:  Status is: Inpatient  Remains inpatient appropriate because:Inpatient level of care appropriate due to severity of illness  Dispo: The patient is from: SNF              Anticipated d/c is to: SNF              Patient currently is not medically stable to d/c.   Difficult to place patient No  Consultants:  None  Procedures:  None  Antimicrobials: None   Subjective: Said he thought he would die this morning because he couldn't catch his breath, his arms were shaky and numb, his right hip was hurting. This resolved spontaneously overall but he still is short of breath.   Objective: Vitals:   10/02/20 0300 10/02/20 0500 10/02/20 0919 10/02/20 1453  BP: 115/78  98/75 101/72  Pulse: 69  70 76  Resp: 18  19 20   Temp: 98.1 F (36.7 C)  98 F (36.7 C) 98.4 F (36.9 C)  TempSrc: Oral   Oral  SpO2: 94%  97% 92%  Weight:  135.2 kg    Height:  Intake/Output Summary (Last 24 hours) at 10/02/2020 1732 Last data filed at 10/02/2020 1300 Gross per 24 hour  Intake 480 ml  Output 750 ml  Net -270 ml   Filed Weights   10/01/20 1757 10/02/20 0500  Weight: 136.1 kg 135.2 kg    Gen: Elderly HOH male in no distress Pulm: Non-labored tachypnea with supplemental oxygen, crackles noted without wheezes.  CV: Regular rate and rhythm. No murmur, rub, or gallop. No definite JVD. GI: Abdomen soft, non-tender, non-distended, with normoactive bowel sounds. No organomegaly or masses felt. Ext: Warm, no deformities.  Bilateral LE's with hypertrophic and erythematous dermatitis symmetrically without induration or fluctuance.  Skin: As above, no other rashes, lesions or ulcers Neuro: Alert and oriented. No focal neurological deficits. Psych: Judgement and insight appear normal. Mood & affect appropriate.   Data Reviewed: I have personally reviewed following labs and imaging studies  CBC: Recent Labs  Lab 10/01/20 1757 10/02/20 0412  WBC 8.7 7.8  NEUTROABS 5.6  --   HGB 16.0 16.2  HCT 52.8* 53.3*  MCV 111.2* 111.0*  PLT 162 163   Basic Metabolic Panel: Recent Labs  Lab 10/01/20 1757 10/02/20 0412  NA 137 140  K 4.7 4.7  CL 89* 90*  CO2 40* 40*  GLUCOSE 132* 110*  BUN 35* 32*  CREATININE 1.45* 1.28*  CALCIUM 8.9 9.0  MG  --  2.2  PHOS  --  4.2   GFR: Estimated Creatinine Clearance: 60.7 mL/min (A) (by C-G formula based on SCr of 1.28 mg/dL (H)). Liver Function Tests: Recent Labs  Lab 10/01/20 1757 10/02/20 0412  AST 51* 46*  ALT 31 30  ALKPHOS 117 103  BILITOT 1.6* 1.9*  PROT 7.3 7.0  ALBUMIN 4.1 4.0   Recent Labs  Lab 10/01/20 1757  LIPASE 28   No results for input(s): AMMONIA in the last 168 hours. Coagulation Profile: Recent Labs  Lab 10/02/20 0412  INR 1.1   Cardiac Enzymes: No results for input(s): CKTOTAL, CKMB, CKMBINDEX, TROPONINI in the last 168 hours. BNP (last 3 results) No results for input(s): PROBNP in the last 8760 hours. HbA1C: No results for input(s): HGBA1C in the last 72 hours. CBG: No results for input(s): GLUCAP in the last 168 hours. Lipid Profile: No results for input(s): CHOL, HDL, LDLCALC, TRIG, CHOLHDL, LDLDIRECT in the last 72 hours. Thyroid Function Tests: No results for input(s): TSH, T4TOTAL, FREET4, T3FREE, THYROIDAB in the last 72 hours. Anemia Panel: Recent Labs    10/02/20 0412  VITAMINB12 366  FOLATE 10.2   Urine analysis: No results found for: COLORURINE, APPEARANCEUR, LABSPEC, PHURINE, GLUCOSEU, HGBUR, BILIRUBINUR,  KETONESUR, PROTEINUR, UROBILINOGEN, NITRITE, LEUKOCYTESUR Recent Results (from the past 240 hour(s))  Resp Panel by RT-PCR (Flu A&B, Covid) Nasopharyngeal Swab     Status: None   Collection Time: 10/01/20  6:48 PM   Specimen: Nasopharyngeal Swab; Nasopharyngeal(NP) swabs in vial transport medium  Result Value Ref Range Status   SARS Coronavirus 2 by RT PCR NEGATIVE NEGATIVE Final    Comment: (NOTE) SARS-CoV-2 target nucleic acids are NOT DETECTED.  The SARS-CoV-2 RNA is generally detectable in upper respiratory specimens during the acute phase of infection. The lowest concentration of SARS-CoV-2 viral copies this assay can detect is 138 copies/mL. A negative result does not preclude SARS-Cov-2 infection and should not be used as the sole basis for treatment or other patient management decisions. A negative result may occur with  improper specimen collection/handling, submission of specimen other than nasopharyngeal swab, presence  of viral mutation(s) within the areas targeted by this assay, and inadequate number of viral copies(<138 copies/mL). A negative result must be combined with clinical observations, patient history, and epidemiological information. The expected result is Negative.  Fact Sheet for Patients:  BloggerCourse.com  Fact Sheet for Healthcare Providers:  SeriousBroker.it  This test is no t yet approved or cleared by the [place] FDA and  has been authorized for detection and/or diagnosis of SARS-CoV-2 by FDA under an Emergency Use Authorization (EUA). This EUA will remain  in effect (meaning this test can be used) for the duration of the COVID-19 declaration under Section 564(b)(1) of the Act, 21 U.S.C.section 360bbb-3(b)(1), unless the authorization is terminated  or revoked sooner.       Influenza A by PCR NEGATIVE NEGATIVE Final   Influenza B by PCR NEGATIVE NEGATIVE Final    Comment: (NOTE) The Xpert  Xpress SARS-CoV-2/FLU/RSV plus assay is intended as an aid in the diagnosis of influenza from Nasopharyngeal swab specimens and should not be used as a sole basis for treatment. Nasal washings and aspirates are unacceptable for Xpert Xpress SARS-CoV-2/FLU/RSV testing.  Fact Sheet for Patients: BloggerCourse.com  Fact Sheet for Healthcare Providers: SeriousBroker.it  This test is not yet approved or cleared by the [place] FDA and has been authorized for detection and/or diagnosis of SARS-CoV-2 by FDA under an Emergency Use Authorization (EUA). This EUA will remain in effect (meaning this test can be used) for the duration of the COVID-19 declaration under Section 564(b)(1) of the Act, 21 U.S.C. section 360bbb-3(b)(1), unless the authorization is terminated or revoked.  Performed at [facility], [address], [place], [place] [address]       Radiology Studies: DG Pelvis Portable  Result Date: 10/01/2020 CLINICAL DATA:  Right groin pain. EXAM: PORTABLE PELVIS 1-2 VIEWS COMPARISON:  None. FINDINGS: Both hips are normally located. Moderate joint space narrowing. No fracture or bone lesion. The pubic symphysis and SI joints are intact. No pelvic fractures or bone lesions. Moderate stool noted throughout the colon and down into the rectum could suggest constipation. IMPRESSION: Moderate bilateral hip joint space narrowing but no acute bony findings. Electronically Signed   By: Rudie Meyer M.D.   On: 10/01/2020 18:59   US Venous Img Lower Unilateral Right (DVT)  Result Date: 10/02/2020 CLINICAL DATA:  Pain, edema, calf discoloration, history of tobacco abuse EXAM: RIGHT LOWER EXTREMITY VENOUS DOPPLER ULTRASOUND TECHNIQUE: Gray-scale sonography with compression, as well as color and duplex ultrasound, were performed to evaluate the deep venous system(s) from the level of the common femoral vein through the popliteal and proximal calf  veins. COMPARISON:  None. FINDINGS: VENOUS Normal compressibility of the common femoral, superficial femoral, and popliteal veins, as well as the visualized calf veins. Visualized portions of profunda femoral vein and great saphenous vein unremarkable. No filling defects to suggest DVT on grayscale or color Doppler imaging. Doppler waveforms show normal direction of venous flow, normal respiratory phasicity and response to augmentation. Limited views of the contralateral common femoral vein are unremarkable. OTHER None. Limitations: Technologist describes technically difficult study secondary to large body habitus. IMPRESSION: No femoropopliteal DVT nor evidence of DVT within the visualized calf veins. If clinical symptoms are inconsistent or if there are persistent or worsening symptoms, further imaging (possibly involving the iliac veins) may be warranted. Electronically Signed   By: Corlis Leak M.D.   On: 10/02/2020 13:27   DG Chest Portable 1 View  Result Date: 10/01/2020 CLINICAL DATA:  Shortness  of breath EXAM: PORTABLE CHEST 1 VIEW COMPARISON:  None. FINDINGS: The heart is enlarged. Mild tortuosity of the thoracic aorta. Moderate central vascular congestion without overt pulmonary edema. There is a left pleural effusion suspected and overlying atelectasis. IMPRESSION: Cardiac enlargement with vascular congestion and left pleural effusion with overlying atelectasis. Electronically Signed   By: Rudie Meyer M.D.   On: 10/01/2020 18:58   ECHOCARDIOGRAM COMPLETE  Result Date: 10/02/2020    ECHOCARDIOGRAM REPORT   Patient Name:   MAKS CAVALLERO Date of Exam: 10/02/2020 Medical Rec #:  [id number]  Height:       69.0 in Accession #:    [id number] Weight:       298.1 lb Date of Birth:  01/27/1941  BSA:          2.448 m Patient Age:    80 years   BP:           98/75 mmHg Patient Gender: M          HR:           72 bpm. [facility] Procedure: 2D Echo, Cardiac Doppler and Color Doppler Indications:     CHF  History:        Patient has no prior history of Echocardiogram examinations.                 CHF; Risk Factors:Dyslipidemia.  Sonographer:    Mikki Harbor Referring Phys: [id number] OLADAPO ADEFESO  Sonographer Comments: Technically difficult study due to poor echo windows. Patient refused the use of Definity. IMPRESSIONS  1. Left ventricular ejection fraction, by estimation, is 55 to 60%. The left ventricle has normal function. Left ventricular endocardial border not optimally defined to evaluate regional wall motion. There is moderate left ventricular hypertrophy. Left ventricular diastolic parameters are consistent with Grade I diastolic dysfunction (impaired relaxation).  2. Right ventricular systolic function was not well visualized. The right ventricular size is not well visualized.  3. Left atrial size was mildly dilated.  4. The mitral valve is normal in structure. No evidence of mitral valve regurgitation. No evidence of mitral stenosis.  5. The aortic valve has an indeterminant number of cusps. There is severe calcifcation of the aortic valve. There is severe thickening of the aortic valve. Aortic valve regurgitation is not visualized. Mild to moderate aortic valve stenosis. Aortic valve mean gradient measures 17.2 mmHg. Aortic valve peak gradient measures 27.6 mmHg. Aortic valve area, by VTI measures 1.49 cm. FINDINGS  Left Ventricle: Left ventricular ejection fraction, by estimation, is 55 to 60%. The left ventricle has normal function. Left ventricular endocardial border not optimally defined to evaluate regional wall motion. The left ventricular internal cavity size was normal in size. There is moderate left ventricular hypertrophy. Left ventricular diastolic parameters are consistent with Grade I diastolic dysfunction (impaired relaxation). Normal left ventricular filling pressure. Right Ventricle: RV not well visualized, grossly appears enlarged with normal function. The right ventricular size  is not well visualized. Right vetricular wall thickness was not well visualized. Right ventricular systolic function was not well visualized. Left Atrium: Left atrial size was mildly dilated. Right Atrium: Right atrial size was not well visualized. Pericardium: The pericardium was not well visualized. Mitral Valve: The mitral valve is normal in structure. There is mild thickening of the mitral valve leaflet(s). There is mild calcification of the mitral valve leaflet(s). Mild mitral annular calcification. No evidence of mitral valve regurgitation. No evidence of mitral valve stenosis. MV peak gradient,  3.6 mmHg. The mean mitral valve gradient is 1.0 mmHg. Tricuspid Valve: The tricuspid valve is normal in structure. Tricuspid valve regurgitation is not demonstrated. No evidence of tricuspid stenosis. Aortic Valve: The aortic valve has an indeterminant number of cusps. There is severe calcifcation of the aortic valve. There is severe thickening of the aortic valve. There is severe aortic valve annular calcification. Aortic valve regurgitation is not visualized. Mild to moderate aortic stenosis is present. Aortic valve mean gradient measures 17.2 mmHg. Aortic valve peak gradient measures 27.6 mmHg. Aortic valve area, by VTI measures 1.49 cm. Pulmonic Valve: The pulmonic valve was not well visualized. Pulmonic valve regurgitation is not visualized. No evidence of pulmonic stenosis. Aorta: The aortic root is normal in size and structure. Pulmonary Artery: Indeterminant PASP, inadequate TR jet. IAS/Shunts: The interatrial septum was not well visualized.  LEFT VENTRICLE PLAX 2D LVIDd:         4.79 cm  Diastology LVIDs:         3.50 cm  LV e' medial:    6.83 cm/s LV PW:         1.36 cm  LV E/e' medial:  10.2 LV IVS:        1.28 cm  LV e' lateral:   10.10 cm/s LVOT diam:     2.00 cm  LV E/e' lateral: 6.9 LV SV:         101 LV SV Index:   41 LVOT Area:     3.14 cm  RIGHT VENTRICLE RV Basal diam:  5.83 cm RV Mid diam:    5.50  cm TAPSE (M-mode): 3.2 cm LEFT ATRIUM             Index       RIGHT ATRIUM           Index LA diam:        4.80 cm 1.96 cm/m  RA Area:     31.20 cm LA Vol (A2C):   90.2 ml 36.85 ml/m RA Volume:   111.00 ml 45.35 ml/m LA Vol (A4C):   76.8 ml 31.38 ml/m LA Biplane Vol: 92.7 ml 37.87 ml/m  AORTIC VALVE AV Area (Vmax):    1.28 cm AV Area (Vmean):   1.33 cm AV Area (VTI):     1.49 cm AV Vmax:           262.72 cm/s AV Vmean:          196.311 cm/s AV VTI:            0.677 m AV Peak Grad:      27.6 mmHg AV Mean Grad:      17.2 mmHg LVOT Vmax:         107.00 cm/s LVOT Vmean:        83.400 cm/s LVOT VTI:          0.320 m LVOT/AV VTI ratio: 0.47  AORTA Ao Root diam: 3.80 cm MITRAL VALVE MV Area (PHT): 2.56 cm    SHUNTS MV Area VTI:   3.69 cm    Systemic VTI:  0.32 m MV Peak grad:  3.6 mmHg    Systemic Diam: 2.00 cm MV Mean grad:  1.0 mmHg MV Vmax:       0.94 m/s MV Vmean:      55.0 cm/s MV Decel Time: 296 msec MV E velocity: 69.90 cm/s MV A velocity: 89.10 cm/s MV E/A ratio:  0.78 Dina Rich MD Electronically signed by Dina Rich MD Signature Date/Time: 10/02/2020/11:37:45  AM    Final     Scheduled Meds:  atorvastatin  20 mg Oral Daily   cholecalciferol  1,000 Units Oral Daily   enoxaparin (LOVENOX) injection  40 mg Subcutaneous Q24H   levothyroxine  100 mcg Oral Q0600   polyethylene glycol  17 g Oral Daily   potassium chloride SA  20 mEq Oral BID   pramipexole  1 mg Oral QHS   Continuous Infusions:   LOS: 1 day   Time spent: 25 minutes.  Tyrone Nine, MD [facility] [email or web address] 10/02/2020, 5:32 PM

## 2020-10-02 NOTE — Progress Notes (Signed)
  Echocardiogram 2D Echocardiogram has been performed.  Vernon Campbell 10/02/2020, 11:03 AM

## 2020-10-03 ENCOUNTER — Inpatient Hospital Stay (HOSPITAL_COMMUNITY): Payer: Medicare Other

## 2020-10-03 DIAGNOSIS — I5033 Acute on chronic diastolic (congestive) heart failure: Secondary | ICD-10-CM | POA: Diagnosis not present

## 2020-10-03 DIAGNOSIS — N179 Acute kidney failure, unspecified: Secondary | ICD-10-CM | POA: Diagnosis not present

## 2020-10-03 DIAGNOSIS — I872 Venous insufficiency (chronic) (peripheral): Secondary | ICD-10-CM | POA: Diagnosis not present

## 2020-10-03 DIAGNOSIS — J9621 Acute and chronic respiratory failure with hypoxia: Secondary | ICD-10-CM | POA: Diagnosis not present

## 2020-10-03 LAB — BASIC METABOLIC PANEL
Anion gap: 8 (ref 5–15)
BUN: 29 mg/dL — ABNORMAL HIGH (ref 8–23)
CO2: 41 mmol/L — ABNORMAL HIGH (ref 22–32)
Calcium: 8.8 mg/dL — ABNORMAL LOW (ref 8.9–10.3)
Chloride: 89 mmol/L — ABNORMAL LOW (ref 98–111)
Creatinine, Ser: 1.28 mg/dL — ABNORMAL HIGH (ref 0.61–1.24)
GFR, Estimated: 56 mL/min — ABNORMAL LOW (ref >60)
Glucose, Bld: 89 mg/dL (ref 70–99)
Potassium: 4.2 mmol/L (ref 3.5–5.1)
Sodium: 138 mmol/L (ref 135–145)

## 2020-10-03 MED ORDER — TORSEMIDE 20 MG PO TABS: 60.0000 mg | ORAL_TABLET | Freq: Every day | ORAL | Status: DC

## 2020-10-03 MED ORDER — HYDROCODONE-ACETAMINOPHEN 5-325 MG PO TABS: 1.0000 | ORAL_TABLET | Freq: Three times a day (TID) | ORAL | 0 refills | Status: AC | PRN

## 2020-10-03 NOTE — Discharge Summary (Signed)
Physician Discharge Summary  Vernon Campbell ZOX:[id number] DOB: 06-11-1940 DOA: 10/01/2020  Admit date: 10/01/2020 Discharge date: 10/03/2020  Admitted From:  SNF  Disposition:   SNF   Recommendations for Outpatient Follow-up:  Follow up with cardiology regarding aortic valve stenosis Please check BMP in 1 week to follow electrolytes and renal function Ambulatory referral for palliative care requested.    Discharge Condition: STABLE   CODE STATUS: DNR DIET: heart healthy low sodium    Brief Hospitalization Summary: Please see all hospital notes, images, labs for full details of the hospitalization. ADMISSION HPI: Vernon Campbell is an 80 y.o. 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  Patient was reported to have had unwitnessed vomiting within the last 24 hours and noted to be requiring more oxygen to maintain normal O2 sats.  He endorsed right thigh/groin pain which worsens with ambulation.  Patient denies chest pain, fever, chills, abdominal pain or diarrhea.   ED Course: In the emergency department, he was hemodynamically stable, though BP was soft at 104/62.  Work-up in the ED showed macrocytic anemia, BUN/creatinine 35/1.45 (no prior labs for comparison).  BNP 29.0, lipase 28.  Influenza A, B, SARS coronavirus 2 was negative.Pelvic x-ray showed moderate bilateral hip joint space narrowing but no acute bony findings.  Chest x-ray showed Cardiac enlargement with vascular congestion and left pleural effusion with overlying atelectasis.  IV Lasix 40 mg x 1 was given.  Hospitalist was asked to admit patient for further evaluation and management.   HOSPITAL COURSE BY PROBLEM LIST  Acute on chronic hypoxic respiratory failure due to acute on chronic HFpEF: Echo shows LVEF 55-60%,  mod LVH, G1DD, mild-mod stenosis of severely thickened/calcified AV.   - Wean oxygen as able.  FOR NOW HE IS COMFORTABLE ON 3-4L/MIN OXYGEN - he was treated with lasix  IV BID with good results and will discharge on home dose of torsemide  daily. Continue home spironolactone. DC diltiazem with 1st deg heart block/NSR and normotension. - Strict I/O - Daily weights (roughly stable) -ambulatory referral to palliative care for further goals of care discussion  MODERATE AORTIC VALVE STENOSIS - OUTPATIENT FOLLOW UP WITH CARDIOLOGY FOR ONGOING SURVEILLANCE.    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 - Symptomatic management.  NEGATIVE VENOUS ULTRASOUND   STAGE 3A CKD: RECHECK BMP IN 1 WEEK RECOMMENDED.  - Monitor with diuresis   HLD: - Continue statin   Hypothyroidism: - Continue synthroid   RLS: - Continue mirapex   Morbid obesity: Estimated body mass index is 44.02 kg/m as calculated from the following:   Height as of this encounter:  (1.753 m).   Weight as of this encounter: 135.2 kg.   DNR present on admission - continue DNR order and ambulatory referral made to palliative care for further goals of care discussion.     DVT prophylaxis: Lovenox Code Status: DNR Family Communication: Called son without answer, VM not set up. Disposition Plan: RETURN TO SNF  Status is: Inpatient  Discharge Diagnoses:  Principal Problem:   Acute exacerbation of CHF (congestive heart failure) (HCC) Active Problems:   Hyperlipidemia   Hypothyroidism   Macrocytic anemia   AKI (acute kidney injury) (HCC)   Obesity,  Class III, BMI 40-49.9 (morbid obesity) (HCC)   Acute on chronic respiratory failure with hypoxia (HCC)   Right thigh pain   Right groin pain   Chronic stasis dermatitis   Discharge Instructions: Discharge Instructions     Amb Referral to Palliative Care   Complete by: As  directed    Ambulatory referral to Cardiology   Complete by: As directed       Allergies as of 10/03/2020   No Known Allergies      Medication List     STOP taking these medications    diltiazem 180 MG 24 hr capsule Commonly known as: CARDIZEM CD   gabapentin 300 MG capsule Commonly known as: NEURONTIN       TAKE these medications    atorvastatin 20 MG tablet Commonly known as: LIPITOR Take 20 mg by mouth daily.   cholecalciferol 25 MCG (1000 UNIT) tablet Commonly known as: VITAMIN D3 Take 1,000 Units by mouth daily.   ergocalciferol 1.25 MG (50000 UT) capsule Commonly known as: VITAMIN D2 Take 50,000 Units by mouth once a week.   HYDROcodone-acetaminophen 5-325 MG tablet Commonly known as: NORCO/VICODIN Take 1 tablet by mouth 3 (three) times daily as needed for up to 3 days for severe pain. What changed:  when to take this reasons to take this   ketoconazole 2 % shampoo Commonly known as: NIZORAL Apply 1 application topically 2 (two) times a week. Shampoo on Tuesday. Bathe on Friday.   lactulose 10 GM/15ML solution Commonly known as: CHRONULAC Take 10 g by mouth daily as needed for mild constipation.   levothyroxine 100 MCG tablet Commonly known as: SYNTHROID Take 100 mcg by mouth daily before breakfast.   polyethylene glycol 17 g packet Commonly known as: MIRALAX / GLYCOLAX Take 17 g by mouth daily.   potassium chloride SA 20 MEQ tablet Commonly known as: KLOR-CON Take 20 mEq by mouth 2 (two) times daily.   pramipexole 1 MG tablet Commonly known as: MIRAPEX Take 1 mg by mouth at bedtime.   sennosides-docusate sodium 8.6-50 MG tablet Commonly known as: SENOKOT-S Take 2 tablets by mouth at bedtime.   spironolactone 25 MG tablet Commonly known as: ALDACTONE Take 25 mg by mouth daily.   torsemide 20 MG tablet Commonly known as: DEMADEX Take 3 tablets (60 mg total) by mouth daily. Start taking on: October 04, 2020 What changed: how much to  take        No Known Allergies Allergies as of 10/03/2020   No Known Allergies      Medication List     STOP taking these medications    diltiazem 180 MG 24 hr capsule Commonly known as: CARDIZEM CD   gabapentin 300 MG capsule Commonly known as: NEURONTIN       TAKE these medications    atorvastatin 20 MG tablet Commonly known as: LIPITOR Take 20 mg by mouth daily.   cholecalciferol 25 MCG (1000 UNIT) tablet Commonly known as: VITAMIN D3 Take 1,000 Units by mouth daily.   ergocalciferol 1.25 MG (50000 UT) capsule Commonly known as: VITAMIN D2 Take 50,000 Units by mouth once a week.   HYDROcodone-acetaminophen 5-325 MG tablet Commonly known as: NORCO/VICODIN Take 1 tablet by mouth 3 (three) times daily as needed for up to 3 days for severe pain. What changed:  when to take this reasons to take this   ketoconazole 2 % shampoo Commonly known as: NIZORAL Apply 1 application topically 2 (two) times a week. Shampoo on Tuesday.  Bathe on Friday.   lactulose 10 GM/15ML solution Commonly known as: CHRONULAC Take 10 g by mouth daily as needed for mild constipation.   levothyroxine 100 MCG tablet Commonly known as: SYNTHROID Take 100 mcg by mouth daily before breakfast.   polyethylene glycol 17 g packet Commonly known as: MIRALAX / GLYCOLAX Take 17 g by mouth daily.   potassium chloride SA 20 MEQ tablet Commonly known as: KLOR-CON Take 20 mEq by mouth 2 (two) times daily.   pramipexole 1 MG tablet Commonly known as: MIRAPEX Take 1 mg by mouth at bedtime.   sennosides-docusate sodium 8.6-50 MG tablet Commonly known as: SENOKOT-S Take 2 tablets by mouth at bedtime.   spironolactone 25 MG tablet Commonly known as: ALDACTONE Take 25 mg by mouth daily.   torsemide 20 MG tablet Commonly known as: DEMADEX Take 3 tablets (60 mg total) by mouth daily. Start taking on: October 04, 2020 What changed: how much to take        Procedures/Studies: DG Pelvis  Portable  Result Date: 10/01/2020 CLINICAL DATA:  Right groin pain. EXAM: PORTABLE PELVIS 1-2 VIEWS COMPARISON:  None. FINDINGS: Both hips are normally located. Moderate joint space narrowing. No fracture or bone lesion. The pubic symphysis and SI joints are intact. No pelvic fractures or bone lesions. Moderate stool noted throughout the colon and down into the rectum could suggest constipation. IMPRESSION: Moderate bilateral hip joint space narrowing but no acute bony findings. Electronically Signed   By: Rudie Meyer M.D.   On: 10/01/2020 18:59   US Venous Img Lower Unilateral Right (DVT)  Result Date: 10/02/2020 CLINICAL DATA:  Pain, edema, calf discoloration, history of tobacco abuse EXAM: RIGHT LOWER EXTREMITY VENOUS DOPPLER ULTRASOUND TECHNIQUE: Gray-scale sonography with compression, as well as color and duplex ultrasound, were performed to evaluate the deep venous system(s) from the level of the common femoral vein through the popliteal and proximal calf veins. COMPARISON:  None. FINDINGS: VENOUS Normal compressibility of the common femoral, superficial femoral, and popliteal veins, as well as the visualized calf veins. Visualized portions of profunda femoral vein and great saphenous vein unremarkable. No filling defects to suggest DVT on grayscale or color Doppler imaging. Doppler waveforms show normal direction of venous flow, normal respiratory phasicity and response to augmentation. Limited views of the contralateral common femoral vein are unremarkable. OTHER None. Limitations: Technologist describes technically difficult study secondary to large body habitus. IMPRESSION: No femoropopliteal DVT nor evidence of DVT within the visualized calf veins. If clinical symptoms are inconsistent or if there are persistent or worsening symptoms, further imaging (possibly involving the iliac veins) may be warranted. Electronically Signed   By: Corlis Leak M.D.   On: 10/02/2020 13:27   DG Chest Portable 1  View  Result Date: 10/01/2020 CLINICAL DATA:  Shortness of breath EXAM: PORTABLE CHEST 1 VIEW COMPARISON:  None. FINDINGS: The heart is enlarged. Mild tortuosity of the thoracic aorta. Moderate central vascular congestion without overt pulmonary edema. There is a left pleural effusion suspected and overlying atelectasis. IMPRESSION: Cardiac enlargement with vascular congestion and left pleural effusion with overlying atelectasis. Electronically Signed   By: Rudie Meyer M.D.   On: 10/01/2020 18:58   ECHOCARDIOGRAM COMPLETE  Result Date: 10/02/2020    ECHOCARDIOGRAM REPORT   Patient Name:   Vernon Campbell Date of Exam: 10/02/2020 Medical Rec #:  [id number]  Height:       69.0 in Accession #:    [id number] Weight:       298.1 lb  Date of Birth:  [birth year]  BSA:          82.448 m Patient Age:    80 years   BP:           98/75 mmHg Patient Gender: M          HR:           72 bpm. [facility] Procedure: 2D Echo, Cardiac Doppler and Color Doppler Indications:    CHF  History:        Patient has no prior history of Echocardiogram examinations.                 CHF; Risk Factors:Dyslipidemia.  Sonographer:    Mikki Harbor Referring Phys: [id number] OLADAPO ADEFESO  Sonographer Comments: Technically difficult study due to poor echo windows. Patient refused the use of Definity. IMPRESSIONS  1. Left ventricular ejection fraction, by estimation, is 55 to 60%. The left ventricle has normal function. Left ventricular endocardial border not optimally defined to evaluate regional wall motion. There is moderate left ventricular hypertrophy. Left ventricular diastolic parameters are consistent with Grade I diastolic dysfunction (impaired relaxation).  2. Right ventricular systolic function was not well visualized. The right ventricular size is not well visualized.  3. Left atrial size was mildly dilated.  4. The mitral valve is normal in structure. No evidence of mitral valve regurgitation. No evidence of mitral  stenosis.  5. The aortic valve has an indeterminant number of cusps. There is severe calcifcation of the aortic valve. There is severe thickening of the aortic valve. Aortic valve regurgitation is not visualized. Mild to moderate aortic valve stenosis. Aortic valve mean gradient measures 17.2 mmHg. Aortic valve peak gradient measures 27.6 mmHg. Aortic valve area, by VTI measures 1.49 cm. FINDINGS  Left Ventricle: Left ventricular ejection fraction, by estimation, is 55 to 60%. The left ventricle has normal function. Left ventricular endocardial border not optimally defined to evaluate regional wall motion. The left ventricular internal cavity size was normal in size. There is moderate left ventricular hypertrophy. Left ventricular diastolic parameters are consistent with Grade I diastolic dysfunction (impaired relaxation). Normal left ventricular filling pressure. Right Ventricle: RV not well visualized, grossly appears enlarged with normal function. The right ventricular size is not well visualized. Right vetricular wall thickness was not well visualized. Right ventricular systolic function was not well visualized. Left Atrium: Left atrial size was mildly dilated. Right Atrium: Right atrial size was not well visualized. Pericardium: The pericardium was not well visualized. Mitral Valve: The mitral valve is normal in structure. There is mild thickening of the mitral valve leaflet(s). There is mild calcification of the mitral valve leaflet(s). Mild mitral annular calcification. No evidence of mitral valve regurgitation. No evidence of mitral valve stenosis. MV peak gradient, 3.6 mmHg. The mean mitral valve gradient is 1.0 mmHg. Tricuspid Valve: The tricuspid valve is normal in structure. Tricuspid valve regurgitation is not demonstrated. No evidence of tricuspid stenosis. Aortic Valve: The aortic valve has an indeterminant number of cusps. There is severe calcifcation of the aortic valve. There is severe thickening  of the aortic valve. There is severe aortic valve annular calcification. Aortic valve regurgitation is not visualized. Mild to moderate aortic stenosis is present. Aortic valve mean gradient measures 17.2 mmHg. Aortic valve peak gradient measures 27.6 mmHg. Aortic valve area, by VTI measures 1.49 cm. Pulmonic Valve: The pulmonic valve was not well visualized. Pulmonic valve regurgitation is not visualized. No evidence of pulmonic stenosis. Aorta: The  aortic root is normal in size and structure. Pulmonary Artery: Indeterminant PASP, inadequate TR jet. IAS/Shunts: The interatrial septum was not well visualized.  LEFT VENTRICLE PLAX 2D LVIDd:         4.79 cm  Diastology LVIDs:         3.50 cm  LV e' medial:    6.83 cm/s LV PW:         1.36 cm  LV E/e' medial:  10.2 LV IVS:        1.28 cm  LV e' lateral:   10.10 cm/s LVOT diam:     2.00 cm  LV E/e' lateral: 6.9 LV SV:         101 LV SV Index:   41 LVOT Area:     3.14 cm  RIGHT VENTRICLE RV Basal diam:  5.83 cm RV Mid diam:    5.50 cm TAPSE (M-mode): 3.2 cm LEFT ATRIUM             Index       RIGHT ATRIUM           Index LA diam:        4.80 cm 1.96 cm/m  RA Area:     31.20 cm LA Vol (A2C):   90.2 ml 36.85 ml/m RA Volume:   111.00 ml 45.35 ml/m LA Vol (A4C):   76.8 ml 31.38 ml/m LA Biplane Vol: 92.7 ml 37.87 ml/m  AORTIC VALVE AV Area (Vmax):    1.28 cm AV Area (Vmean):   1.33 cm AV Area (VTI):     1.49 cm AV Vmax:           262.72 cm/s AV Vmean:          196.311 cm/s AV VTI:            0.677 m AV Peak Grad:      27.6 mmHg AV Mean Grad:      17.2 mmHg LVOT Vmax:         107.00 cm/s LVOT Vmean:        83.400 cm/s LVOT VTI:          0.320 m LVOT/AV VTI ratio: 0.47  AORTA Ao Root diam: 3.80 cm MITRAL VALVE MV Area (PHT): 2.56 cm    SHUNTS MV Area VTI:   3.69 cm    Systemic VTI:  0.32 m MV Peak grad:  3.6 mmHg    Systemic Diam: 2.00 cm MV Mean grad:  1.0 mmHg MV Vmax:       0.94 m/s MV Vmean:      55.0 cm/s MV Decel Time: 296 msec MV E velocity: 69.90 cm/s MV A  velocity: 89.10 cm/s MV E/A ratio:  0.78 Dina RichJonathan Branch MD Electronically signed by Dina RichJonathan Branch MD Signature Date/Time: 10/02/2020/11:37:45 AM    Final      Subjective: Pt reports that he is feeling much better and ready to get out of the hospital at this time.  No specific complaints.  No CP and no SOB.  He is comfortable on 3-4L/min oxygen.  He is ambulating.    Discharge Exam: Vitals:   10/02/20 2021 10/03/20 0255  BP: 134/62 115/68  Pulse: 73 69  Resp: 19 19  Temp: 98.2 F (36.8 C) 98.2 F (36.8 C)  SpO2: 96% 94%   Vitals:   10/02/20 0919 10/02/20 1453 10/02/20 2021 10/03/20 0255  BP: 98/75 101/72 134/62 115/68  Pulse: 70 76 73 69  Resp: 19 20 19  19  Temp: 98 F (36.7 C) 98.4 F (36.9 C) 98.2 F (36.8 C) 98.2 F (36.8 C)  TempSrc:  Oral    SpO2: 97% 92% 96% 94%  Weight:      Height:       General: sitting up in chair, Pt is alert, awake, not in acute distress Cardiovascular: normal S1/S2 +, no rubs, no gallops Respiratory: diminished BS LLL, no wheezing, no rhonchi Abdominal: Soft, NT, ND, bowel sounds + Extremities: trace pretibial edema, no cyanosis   The results of significant diagnostics from this hospitalization (including imaging, microbiology, ancillary and laboratory) are listed below for reference.     Microbiology: Recent Results (from the past 240 hour(s))  Resp Panel by RT-PCR (Flu A&B, Covid) Nasopharyngeal Swab     Status: None   Collection Time: 10/01/20  6:48 PM   Specimen: Nasopharyngeal Swab; Nasopharyngeal(NP) swabs in vial transport medium  Result Value Ref Range Status   SARS Coronavirus 2 by RT PCR NEGATIVE NEGATIVE Final    Comment: (NOTE) SARS-CoV-2 target nucleic acids are NOT DETECTED.  The SARS-CoV-2 RNA is generally detectable in upper respiratory specimens during the acute phase of infection. The lowest concentration of SARS-CoV-2 viral copies this assay can detect is 138 copies/mL. A negative result does not preclude  SARS-Cov-2 infection and should not be used as the sole basis for treatment or other patient management decisions. A negative result may occur with  improper specimen collection/handling, submission of specimen other than nasopharyngeal swab, presence of viral mutation(s) within the areas targeted by this assay, and inadequate number of viral copies(<138 copies/mL). A negative result must be combined with clinical observations, patient history, and epidemiological information. The expected result is Negative.  Fact Sheet for Patients:  BloggerCourse.com  Fact Sheet for Healthcare Providers:  SeriousBroker.it  This test is no t yet approved or cleared by the [place] FDA and  has been authorized for detection and/or diagnosis of SARS-CoV-2 by FDA under an Emergency Use Authorization (EUA). This EUA will remain  in effect (meaning this test can be used) for the duration of the COVID-19 declaration under Section 564(b)(1) of the Act, 21 U.S.C.section 360bbb-3(b)(1), unless the authorization is terminated  or revoked sooner.       Influenza A by PCR NEGATIVE NEGATIVE Final   Influenza B by PCR NEGATIVE NEGATIVE Final    Comment: (NOTE) The Xpert Xpress SARS-CoV-2/FLU/RSV plus assay is intended as an aid in the diagnosis of influenza from Nasopharyngeal swab specimens and should not be used as a sole basis for treatment. Nasal washings and aspirates are unacceptable for Xpert Xpress SARS-CoV-2/FLU/RSV testing.  Fact Sheet for Patients: BloggerCourse.com  Fact Sheet for Healthcare Providers: SeriousBroker.it  This test is not yet approved or cleared by the [place] FDA and has been authorized for detection and/or diagnosis of SARS-CoV-2 by FDA under an Emergency Use Authorization (EUA). This EUA will remain in effect (meaning this test can be used) for the duration of  the COVID-19 declaration under Section 564(b)(1) of the Act, 21 U.S.C. section 360bbb-3(b)(1), unless the authorization is terminated or revoked.  Performed at [facility], [address] [place], [place] [address]      Labs: BNP (last 3 results) Recent Labs    10/01/20 1757  BNP 29.0   Basic Metabolic Panel: Recent Labs  Lab 10/01/20 1757 10/02/20 0412 10/03/20 0445  NA 137 140 138  K 4.7 4.7 4.2  CL 89* 90* 89*  CO2 40* 40* 41*  GLUCOSE 132*  110* 89  BUN 35* 32* 29*  CREATININE 1.45* 1.28* 1.28*  CALCIUM 8.9 9.0 8.8*  MG  --  2.2  --   PHOS  --  4.2  --    Liver Function Tests: Recent Labs  Lab 10/01/20 1757 10/02/20 0412  AST 51* 46*  ALT 31 30  ALKPHOS 117 103  BILITOT 1.6* 1.9*  PROT 7.3 7.0  ALBUMIN 4.1 4.0   Recent Labs  Lab 10/01/20 1757  LIPASE 28   No results for input(s): AMMONIA in the last 168 hours. CBC: Recent Labs  Lab 10/01/20 1757 10/02/20 0412  WBC 8.7 7.8  NEUTROABS 5.6  --   HGB 16.0 16.2  HCT 52.8* 53.3*  MCV 111.2* 111.0*  PLT 162 163   Cardiac Enzymes: No results for input(s): CKTOTAL, CKMB, CKMBINDEX, TROPONINI in the last 168 hours. BNP: Invalid input(s): POCBNP CBG: No results for input(s): GLUCAP in the last 168 hours. D-Dimer No results for input(s): DDIMER in the last 72 hours. Hgb A1c No results for input(s): HGBA1C in the last 72 hours. Lipid Profile No results for input(s): CHOL, HDL, LDLCALC, TRIG, CHOLHDL, LDLDIRECT in the last 72 hours. Thyroid function studies No results for input(s): TSH, T4TOTAL, T3FREE, THYROIDAB in the last 72 hours.  Invalid input(s): FREET3 Anemia work up Recent Labs    10/02/20 0412  VITAMINB12 366  FOLATE 10.2   Urinalysis No results found for: COLORURINE, APPEARANCEUR, LABSPEC, PHURINE, GLUCOSEU, HGBUR, BILIRUBINUR, KETONESUR, PROTEINUR, UROBILINOGEN, NITRITE, LEUKOCYTESUR Sepsis Labs Invalid input(s): PROCALCITONIN,  WBC,  LACTICIDVEN Microbiology Recent Results  (from the past 240 hour(s))  Resp Panel by RT-PCR (Flu A&B, Covid) Nasopharyngeal Swab     Status: None   Collection Time: 10/01/20  6:48 PM   Specimen: Nasopharyngeal Swab; Nasopharyngeal(NP) swabs in vial transport medium  Result Value Ref Range Status   SARS Coronavirus 2 by RT PCR NEGATIVE NEGATIVE Final    Comment: (NOTE) SARS-CoV-2 target nucleic acids are NOT DETECTED.  The SARS-CoV-2 RNA is generally detectable in upper respiratory specimens during the acute phase of infection. The lowest concentration of SARS-CoV-2 viral copies this assay can detect is 138 copies/mL. A negative result does not preclude SARS-Cov-2 infection and should not be used as the sole basis for treatment or other patient management decisions. A negative result may occur with  improper specimen collection/handling, submission of specimen other than nasopharyngeal swab, presence of viral mutation(s) within the areas targeted by this assay, and inadequate number of viral copies(<138 copies/mL). A negative result must be combined with clinical observations, patient history, and epidemiological information. The expected result is Negative.  Fact Sheet for Patients:  BloggerCourse.com  Fact Sheet for Healthcare Providers:  SeriousBroker.it  This test is no t yet approved or cleared by the [place] FDA and  has been authorized for detection and/or diagnosis of SARS-CoV-2 by FDA under an Emergency Use Authorization (EUA). This EUA will remain  in effect (meaning this test can be used) for the duration of the COVID-19 declaration under Section 564(b)(1) of the Act, 21 U.S.C.section 360bbb-3(b)(1), unless the authorization is terminated  or revoked sooner.       Influenza A by PCR NEGATIVE NEGATIVE Final   Influenza B by PCR NEGATIVE NEGATIVE Final    Comment: (NOTE) The Xpert Xpress SARS-CoV-2/FLU/RSV plus assay is intended as an aid in the  diagnosis of influenza from Nasopharyngeal swab specimens and should not be used as a sole basis for treatment. Nasal washings and aspirates are unacceptable  for Xpert Xpress SARS-CoV-2/FLU/RSV testing.  Fact Sheet for Patients: BloggerCourse.com  Fact Sheet for Healthcare Providers: SeriousBroker.it  This test is not yet approved or cleared by the [place] FDA and has been authorized for detection and/or diagnosis of SARS-CoV-2 by FDA under an Emergency Use Authorization (EUA). This EUA will remain in effect (meaning this test can be used) for the duration of the COVID-19 declaration under Section 564(b)(1) of the Act, 21 U.S.C. section 360bbb-3(b)(1), unless the authorization is terminated or revoked.  Performed at [facility], [address], [place], [place] [address]     Time coordinating discharge: 40 mins   SIGNED:  Standley Dakins, MD  [facility] 10/03/2020, 10:17 AM How to contact the [facility] Attending or Consulting provider 7A - 7P or covering provider during after hours 7P -7A, for this patient?  Check the care team in [facility] and look for a) attending/consulting TRH provider listed and b) the [facility] team listed Log into [email or web address] and use [place]'s universal password to access. If you do not have the password, please contact the hospital operator. Locate the [facility] provider you are looking for under [facility] and page to a number that you can be directly reached. If you still have difficulty reaching the provider, please page the [facility] (Director on Call) for the Hospitalists listed on amion for assistance.

## 2020-10-03 NOTE — TOC Initial Note (Signed)
Transition of Care [facility]) - Initial/Assessment Note    Patient Details  Name: Vernon Campbell MRN: [id number] Date of Birth: December 24, 1940  Transition of Care [facility]) CM/SW Contact:    Karn Cassis, LCSW Phone Number: 10/03/2020, 8:18 AM  Clinical Narrative:  Pt admitted due to CHF. LCSW noted in chart pt is from [facility]. Per pt, he has been a resident there for about 6 weeks and plans to be long term. LCSW confirmed with Melissa at [facility]. He is fairly independent with ADLs. TOC will complete FL2. Will continue to follow.                  Expected Discharge Plan: Skilled Nursing Facility Barriers to Discharge: Continued Medical Work up   Patient Goals and CMS Choice Patient states their goals for this hospitalization and ongoing recovery are:: return to SNF   Choice offered to / list presented to : Patient  Expected Discharge Plan and Services Expected Discharge Plan: Skilled Nursing Facility In-house Referral: Clinical Social Work   Post Acute Care Choice: Skilled Nursing Facility, Resumption of Svcs/PTA Provider Living arrangements for the past 2 months: Skilled Nursing Facility                 DME Arranged: N/A                    Prior Living Arrangements/Services Living arrangements for the past 2 months: Skilled Nursing Facility Lives with:: Facility Resident Patient language and need for interpreter reviewed:: Yes Do you feel safe going back to the place where you live?: Yes      Need for Family Participation in Patient Care: No (Comment) Care giver support system in place?: Yes (comment)   Criminal Activity/Legal Involvement Pertinent to Current Situation/Hospitalization: No - Comment as needed  Activities of Daily Living Home Assistive Devices/Equipment: Wheelchair, Environmental consultant (specify type) ADL Screening (condition at time of admission) Patient's cognitive ability adequate to safely complete daily activities?: Yes Is the patient deaf or  have difficulty hearing?: No Does the patient have difficulty seeing, even when wearing glasses/contacts?: No Does the patient have difficulty concentrating, remembering, or making decisions?: No Patient able to express need for assistance with ADLs?: Yes Does the patient have difficulty dressing or bathing?: Yes Independently performs ADLs?: No Communication: Needs assistance Is this a change from baseline?: Pre-admission baseline Dressing (OT): Needs assistance Is this a change from baseline?: Pre-admission baseline Grooming: Needs assistance Is this a change from baseline?: Pre-admission baseline Feeding: Independent Bathing: Needs assistance Is this a change from baseline?: Pre-admission baseline Toileting: Needs assistance Is this a change from baseline?: Pre-admission baseline In/Out Bed: Needs assistance Is this a change from baseline?: Pre-admission baseline Walks in Home: Needs assistance Is this a change from baseline?: Pre-admission baseline Does the patient have difficulty walking or climbing stairs?: Yes Weakness of Legs: Both Weakness of Arms/Hands: None  Permission Sought/Granted   Permission granted to share information with : Yes, Verbal Permission Granted     Permission granted to share info w AGENCY: [employer] granted to share info w Relationship: SNF     Emotional Assessment   Attitude/Demeanor/Rapport: Engaged Affect (typically observed): Accepting Orientation: : Oriented to Self, Oriented to Place, Oriented to  Time, Oriented to Situation Alcohol / Substance Use: Not Applicable Psych Involvement: No (comment)  Admission diagnosis:  Hypoxemia [R09.02] Pleural effusion [J90] Acute exacerbation of CHF (congestive heart failure) (HCC) [I50.9] Patient Active Problem List   Diagnosis  Date Noted   Acute exacerbation of CHF (congestive heart failure) (HCC) 10/01/2020   Hyperlipidemia 10/01/2020   Hypothyroidism 10/01/2020   Macrocytic  anemia 10/01/2020   AKI (acute kidney injury) (HCC) 10/01/2020   Obesity, Class III, BMI 40-49.9 (morbid obesity) (HCC) 10/01/2020   Acute on chronic respiratory failure with hypoxia (HCC) 10/01/2020   Right thigh pain 10/01/2020   Right groin pain 10/01/2020   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

## 2020-10-03 NOTE — TOC Transition Note (Addendum)
Transition of Care [facility]) - CM/SW Discharge Note   Patient Details  Name: Vernon Campbell MRN: [id number] Date of Birth: May 30, 1940  Transition of Care [facility]) CM/SW Contact:  Karn Cassis, LCSW Phone Number: 10/03/2020, 11:38 AM   Clinical Narrative:  Pt d/c today back to [facility]. Pt and facility aware and agreeable. Transportation set up with [employer] through [employer]. D/C summary faxed.  COVID negative 7/13. RN given number to call report. Melissa at [facility] aware of increased O2 needs to 4L.     Final next level of care: Skilled Nursing Facility Barriers to Discharge: Barriers Resolved   Patient Goals and CMS Choice Patient states their goals for this hospitalization and ongoing recovery are:: return to SNF   Choice offered to / list presented to : Patient  Discharge Placement                Patient to be transferred to facility by: [facility]- arranged by [facility]   Patient and family notified of of transfer: 10/03/20  Discharge Plan and Services In-house Referral: Clinical Social Work   Post Acute Care Choice: Skilled Nursing Facility, Resumption of Svcs/PTA Provider          DME Arranged: N/A                    Social Determinants of Health (SDOH) Interventions     Readmission Risk Interventions No flowsheet data found.

## 2020-10-03 NOTE — NC FL2 (Signed)
[place] MEDICAID FL2 LEVEL OF CARE SCREENING TOOL     IDENTIFICATION  Patient Name: Vernon Campbell Birthdate: 1941-02-02 Sex: male Admission Date (Current Location): 10/01/2020  [place] and [place] Number:  Aaron Edelman [id number] [facility] Facility and Address:  [facility],  [address], [place] [address]      Provider Number: [id number]  Attending Physician Name and Address:  Cleora Fleet, MD  Relative Name and Phone Number:       Current Level of Care: Hospital Recommended Level of Care: Skilled Nursing Facility Prior Approval Number:    Date Approved/Denied:   PASRR Number:    Discharge Plan: SNF    Current Diagnoses: Patient Active Problem List   Diagnosis Date Noted   Acute exacerbation of CHF (congestive heart failure) (HCC) 10/01/2020   Hyperlipidemia 10/01/2020   Hypothyroidism 10/01/2020   Macrocytic anemia 10/01/2020   AKI (acute kidney injury) (HCC) 10/01/2020   Obesity, Class III, BMI 40-49.9 (morbid obesity) (HCC) 10/01/2020   Acute on chronic respiratory failure with hypoxia (HCC) 10/01/2020   Right thigh pain 10/01/2020   Right groin pain 10/01/2020   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 See d/c summary for updates.)  AMBULATORY STATUS COMMUNICATION OF NEEDS Skin   Limited Assist Verbally Bruising                       Personal Care Assistance Level of Assistance  Bathing, Dressing, Feeding Bathing Assistance: Limited assistance Feeding assistance: Limited assistance Dressing Assistance: Limited assistance     Functional Limitations Info  Sight, Speech, Hearing Sight Info: Adequate Hearing Info: Adequate Speech Info: Adequate    SPECIAL  CARE FACTORS FREQUENCY                       Contractures      Additional Factors Info  Code Status, Allergies Code Status Info: DNR Allergies Info: No known allergies           Current Medications (10/03/2020):  This is the current hospital active medication list Current Facility-Administered Medications  Medication Dose Route Frequency Provider Last Rate Last Admin   atorvastatin (LIPITOR) tablet 20 mg  20 mg Oral Daily Adefeso, Oladapo, DO   20 mg at 10/03/20 10/05/20   cholecalciferol (VITAMIN D3) tablet 1,000 Units  1,000 Units Oral Daily Adefeso, Oladapo, DO   1,000 Units at 10/03/20 0812   enoxaparin (LOVENOX) injection 40 mg  40 mg Subcutaneous Q24H Adefeso, Oladapo, DO   40 mg at 10/02/20 2104   furosemide (LASIX) injection 60 mg  60 mg Intravenous BID 2105, MD   60 mg at 10/03/20 0813   HYDROcodone-acetaminophen (NORCO/VICODIN) 5-325 MG per tablet 1 tablet  1 tablet Oral Q6H PRN Adefeso, Oladapo, DO   1 tablet at 10/03/20 0812   lactulose (CHRONULAC) 10 GM/15ML solution 10 g  10 g Oral Daily PRN Adefeso, Oladapo, DO       levothyroxine (SYNTHROID) tablet 100 mcg  100 mcg Oral Q0600 Adefeso, Oladapo, DO   100 mcg at 10/03/20 0509   ondansetron (ZOFRAN) injection 4 mg  4 mg Intravenous Q6H PRN Adefeso, Oladapo, DO  phenol (CHLORASEPTIC) mouth spray 1 spray  1 spray Mouth/Throat PRN Tyrone Nine, MD   1 spray at 10/03/20 0025   polyethylene glycol (MIRALAX / GLYCOLAX) packet 17 g  17 g Oral Daily Adefeso, Oladapo, DO   17 g at 10/03/20 0813   potassium chloride SA (KLOR-CON) CR tablet 20 mEq  20 mEq Oral BID Adefeso, Oladapo, DO   20 mEq at 10/03/20 4656   pramipexole (MIRAPEX) tablet 1 mg  1 mg Oral QHS Adefeso, Oladapo, DO   1 mg at 10/02/20 2103   senna-docusate (Senokot-S) tablet 2 tablet  2 tablet Oral QHS Tyrone Nine, MD   2 tablet at 10/02/20 2103   spironolactone (ALDACTONE) tablet 25 mg  25 mg Oral Daily Tyrone Nine, MD   25 mg at 10/03/20 8127      Discharge Medications: Please see discharge summary for a list of discharge medications.  Relevant Imaging Results:  Relevant Lab Results:   Additional Information    Karn Cassis, LCSW

## 2020-10-03 NOTE — Discharge Instructions (Signed)

## 2020-10-21 NOTE — Progress Notes (Deleted)
CARDIOLOGY CONSULT NOTE       Patient ID: Vernon Campbell MRN: [id number] DOB/AGE: [birth year] 80 y.o.  Admit date: (Not on file) Referring Physician: *** Primary Physician: Pcp, No Primary Cardiologist: *** Reason for Consultation: ***  Active Problems:   * No active hospital problems. 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  Social History   Socioeconomic History   Marital status: Single    Spouse name: Not on file   Number of children: Not on file   Years of education: Not on file   Highest education level: Not on file  Occupational History   Not on file  Tobacco Use   Smoking status: Former    Types: Cigarettes   Smokeless tobacco: Never  Vaping Use   Vaping Use: Never used  Substance and  Sexual Activity   Alcohol use: Not Currently   Drug use: Not Currently   Sexual activity: Not Currently  Other Topics Concern   Not on file  Social History Narrative   Not on file   Social Determinants of Health   Financial Resource Strain: Not on file  Food Insecurity: Not on file  Transportation Needs: Not on file  Physical Activity: Not on file  Stress: Not on file  Social Connections: Not on file  Intimate Partner Violence: Not on file    No past surgical history on file.    Current Outpatient Medications:    atorvastatin (LIPITOR) 20 MG tablet, Take 20 mg by mouth daily., Disp: , Rfl:    cholecalciferol (VITAMIN D3) 25 MCG (1000 UNIT) tablet, Take 1,000 Units by mouth daily., Disp: , Rfl:    ergocalciferol (VITAMIN D2) 1.25 MG (50000 UT) capsule, Take 50,000 Units by mouth once a week., Disp: , Rfl:    ketoconazole (NIZORAL) 2 % shampoo, Apply 1 application topically 2 (two) times a week. Shampoo on Tuesday. Bathe on Friday., Disp: , Rfl:    lactulose (CHRONULAC) 10 GM/15ML solution, Take 10 g by mouth daily as needed for mild constipation., Disp: , Rfl:    levothyroxine (SYNTHROID) 100 MCG tablet, Take 100 mcg by mouth daily before  breakfast., Disp: , Rfl:    polyethylene glycol (MIRALAX / GLYCOLAX) 17 g packet, Take 17 g by mouth daily., Disp: , Rfl:    potassium chloride SA (KLOR-CON) 20 MEQ tablet, Take 20 mEq by mouth 2 (two) times daily., Disp: , Rfl:    pramipexole (MIRAPEX) 1 MG tablet, Take 1 mg by mouth at bedtime., Disp: , Rfl:    sennosides-docusate sodium (SENOKOT-S) 8.6-50 MG tablet, Take 2 tablets by mouth at bedtime., Disp: , Rfl:    spironolactone (ALDACTONE) 25 MG tablet, Take 25 mg by mouth daily., Disp: , Rfl:    torsemide (DEMADEX) 20 MG tablet, Take 3 tablets (60 mg total) by mouth daily., Disp: , Rfl:     Physical Exam: There were no vitals taken for this visit. *** HELP TEXT ***  This SmartLink requires parameters. Parameters are variables that  are added to the [facility] name to request specific information. The parameter for .curwt is the number of readings to display.  For example: .curwt[4  In this example, the SmartLink displays the last four encounter readings.    {physical JXBJ:[id number]}exam:[id number]}  Labs:   Lab Results  Component Value Date   WBC 7.8 10/02/2020   HGB 16.2 10/02/2020   HCT 53.3 (H) 10/02/2020   MCV 111.0 (H) 10/02/2020   PLT 163 10/02/2020   No results for input(s): NA, K, CL, CO2, BUN, CREATININE, CALCIUM, PROT, BILITOT, ALKPHOS, ALT, AST, GLUCOSE in the last 168 hours.  Invalid input(s): LABALBU No results found for: CKTOTAL, CKMB, CKMBINDEX, TROPONINI No results found for: CHOL No results found for: HDL No results found for: LDLCALC No results found for: TRIG No results found for: CHOLHDL No results found for: LDLDIRECT    Radiology: DG Pelvis Portable  Result Date: 10/01/2020 CLINICAL DATA:  Right groin pain. EXAM: PORTABLE PELVIS 1-2 VIEWS COMPARISON:  None. FINDINGS: Both hips are normally located. Moderate joint space narrowing. No fracture or bone lesion. The pubic symphysis and SI joints are intact. No pelvic fractures or bone lesions. Moderate stool noted throughout the colon and down into the rectum could suggest constipation. IMPRESSION: Moderate bilateral hip joint space narrowing but no acute bony findings. Electronically Signed   By: Rudie MeyerP.  Gallerani M.D.   On: 10/01/2020 18:59   US Venous Img Lower Unilateral Right (DVT)  Result Date: 10/02/2020 CLINICAL DATA:  Pain, edema, calf discoloration, history of tobacco abuse EXAM: RIGHT LOWER EXTREMITY VENOUS DOPPLER ULTRASOUND TECHNIQUE: Gray-scale sonography with compression, as well as color and duplex ultrasound, were performed to evaluate the deep venous system(s) from the level of the common femoral vein through the popliteal and proximal calf veins. COMPARISON:  None. FINDINGS: VENOUS Normal compressibility of the common femoral, superficial  femoral, and popliteal veins, as well as the visualized calf veins. Visualized portions of profunda femoral vein and great saphenous vein unremarkable. No filling defects to suggest DVT on grayscale or color Doppler imaging. Doppler waveforms show normal direction of venous flow, normal respiratory phasicity and response to augmentation. Limited views of the contralateral common femoral vein are unremarkable. OTHER None. Limitations: Technologist describes technically difficult study secondary to large body habitus. IMPRESSION: No femoropopliteal DVT nor evidence of DVT within the visualized calf veins. If clinical symptoms are inconsistent or if there are persistent or worsening symptoms, further imaging (possibly involving the iliac veins) may be warranted. Electronically Signed   By: Corlis Leak  Hassell M.D.   On: 10/02/2020 13:27   DG CHEST PORT 1 VIEW  Result Date: 10/03/2020 CLINICAL  DATA:  Left pleural effusion, continued shortness of breath EXAM: PORTABLE CHEST 1 VIEW COMPARISON:  10/01/2020 FINDINGS: Stable cardiomediastinal contours with cardiomegaly. Persistent elevation of the left hemidiaphragm. Similar probable left pleural effusion and left basilar atelectasis. Pulmonary vascular congestion. IMPRESSION: Similar appearance to the prior study with persistent pulmonary vascular congestion, elevation of left hemidiaphragm, and probable left pleural effusion and basilar atelectasis. Electronically Signed   By: Guadlupe Spanish M.D.   On: 10/03/2020 13:41   DG Chest Portable 1 View  Result Date: 10/01/2020 CLINICAL DATA:  Shortness of breath EXAM: PORTABLE CHEST 1 VIEW COMPARISON:  None. FINDINGS: The heart is enlarged. Mild tortuosity of the thoracic aorta. Moderate central vascular congestion without overt pulmonary edema. There is a left pleural effusion suspected and overlying atelectasis. IMPRESSION: Cardiac enlargement with vascular congestion and left pleural effusion with overlying atelectasis.  Electronically Signed   By: Rudie Meyer M.D.   On: 10/01/2020 18:58   ECHOCARDIOGRAM COMPLETE  Result Date: 10/02/2020    ECHOCARDIOGRAM REPORT   Patient Name:   SHAYDEN BOBIER Date of Exam: 10/02/2020 Medical Rec #:  [id number]  Height:       69.0 in Accession #:    [id number] Weight:       298.1 lb Date of Birth:  [birth year]  BSA:          2.448 m Patient Age:    80 years   BP:           98/75 mmHg Patient Gender: M          HR:           72 bpm. [facility]am [facility] Procedure: 2D Echo, Cardiac Doppler and Color Doppler Indications:    CHF  History:        Patient has no prior history of Echocardiogram examinations.                 CHF; Risk Factors:Dyslipidemia.  Sonographer:    Mikki Harbor Referring Phys: [id number] OLADAPO ADEFESO  Sonographer Comments: Technically difficult study due to poor echo windows. Patient refused the use of Definity. IMPRESSIONS  1. Left ventricular ejection fraction, by estimation, is 55 to 60%. The left ventricle has normal function. Left ventricular endocardial border not optimally defined to evaluate regional wall motion. There is moderate left ventricular hypertrophy. Left ventricular diastolic parameters are consistent with Grade I diastolic dysfunction (impaired relaxation).  2. Right ventricular systolic function was not well visualized. The right ventricular size is not well visualized.  3. Left atrial size was mildly dilated.  4. The mitral valve is normal in structure. No evidence of mitral valve regurgitation. No evidence of mitral stenosis.  5. The aortic valve has an indeterminant number of cusps. There is severe calcifcation of the aortic valve. There is severe thickening of the aortic valve. Aortic valve regurgitation is not visualized. Mild to moderate aortic valve stenosis. Aortic valve mean gradient measures 17.2 mmHg. Aortic valve peak gradient measures 27.6 mmHg. Aortic valve area, by VTI measures 1.49 cm. FINDINGS  Left Ventricle: Left ventricular  ejection fraction, by estimation, is 55 to 60%. The left ventricle has normal function. Left ventricular endocardial border not optimally defined to evaluate regional wall motion. The left ventricular internal cavity size was normal in size. There is moderate left ventricular hypertrophy. Left ventricular diastolic parameters are consistent with Grade I diastolic dysfunction (impaired relaxation). Normal left ventricular filling pressure. Right Ventricle: RV not well visualized, grossly appears enlarged with normal function.  The right ventricular size is not well visualized. Right vetricular wall thickness was not well visualized. Right ventricular systolic function was not well visualized. Left Atrium: Left atrial size was mildly dilated. Right Atrium: Right atrial size was not well visualized. Pericardium: The pericardium was not well visualized. Mitral Valve: The mitral valve is normal in structure. There is mild thickening of the mitral valve leaflet(s). There is mild calcification of the mitral valve leaflet(s). Mild mitral annular calcification. No evidence of mitral valve regurgitation. No evidence of mitral valve stenosis. MV peak gradient, 3.6 mmHg. The mean mitral valve gradient is 1.0 mmHg. Tricuspid Valve: The tricuspid valve is normal in structure. Tricuspid valve regurgitation is not demonstrated. No evidence of tricuspid stenosis. Aortic Valve: The aortic valve has an indeterminant number of cusps. There is severe calcifcation of the aortic valve. There is severe thickening of the aortic valve. There is severe aortic valve annular calcification. Aortic valve regurgitation is not visualized. Mild to moderate aortic stenosis is present. Aortic valve mean gradient measures 17.2 mmHg. Aortic valve peak gradient measures 27.6 mmHg. Aortic valve area, by VTI measures 1.49 cm. Pulmonic Valve: The pulmonic valve was not well visualized. Pulmonic valve regurgitation is not visualized. No evidence of pulmonic  stenosis. Aorta: The aortic root is normal in size and structure. Pulmonary Artery: Indeterminant PASP, inadequate TR jet. IAS/Shunts: The interatrial septum was not well visualized.  LEFT VENTRICLE PLAX 2D LVIDd:         4.79 cm  Diastology LVIDs:         3.50 cm  LV e' medial:    6.83 cm/s LV PW:         1.36 cm  LV E/e' medial:  10.2 LV IVS:        1.28 cm  LV e' lateral:   10.10 cm/s LVOT diam:     2.00 cm  LV E/e' lateral: 6.9 LV SV:         101 LV SV Index:   41 LVOT Area:     3.14 cm  RIGHT VENTRICLE RV Basal diam:  5.83 cm RV Mid diam:    5.50 cm TAPSE (M-mode): 3.2 cm LEFT ATRIUM             Index       RIGHT ATRIUM           Index LA diam:        4.80 cm 1.96 cm/m  RA Area:     31.20 cm LA Vol (A2C):   90.2 ml 36.85 ml/m RA Volume:   111.00 ml 45.35 ml/m LA Vol (A4C):   76.8 ml 31.38 ml/m LA Biplane Vol: 92.7 ml 37.87 ml/m  AORTIC VALVE AV Area (Vmax):    1.28 cm AV Area (Vmean):   1.33 cm AV Area (VTI):     1.49 cm AV Vmax:           262.72 cm/s AV Vmean:          196.311 cm/s AV VTI:            0.677 m AV Peak Grad:      27.6 mmHg AV Mean Grad:      17.2 mmHg LVOT Vmax:         107.00 cm/s LVOT Vmean:        83.400 cm/s LVOT VTI:          0.320 m LVOT/AV VTI ratio: 0.47  AORTA Ao Root diam: 3.80 cm MITRAL VALVE  MV Area (PHT): 2.56 cm    SHUNTS MV Area VTI:   3.69 cm    Systemic VTI:  0.32 m MV Peak grad:  3.6 mmHg    Systemic Diam: 2.00 cm MV Mean grad:  1.0 mmHg MV Vmax:       0.94 m/s MV Vmean:      55.0 cm/s MV Decel Time: 296 msec MV E velocity: 69.90 cm/s MV A velocity: 89.10 cm/s MV E/A ratio:  0.78 Dina Rich MD Electronically signed by Dina Rich MD Signature Date/Time: 10/02/2020/11:37:45 AM    Final     EKG: ***   ASSESSMENT AND PLAN:    Signed: Charlton [facility]s 10/21/2020, 5:36 PM

## 2020-10-28 ENCOUNTER — Ambulatory Visit (INDEPENDENT_AMBULATORY_CARE_PROVIDER_SITE_OTHER): Payer: Medicare Other | Admitting: Cardiovascular Disease

## 2020-10-28 ENCOUNTER — Encounter: Payer: Self-pay | Admitting: Cardiovascular Disease

## 2020-10-28 ENCOUNTER — Ambulatory Visit: Payer: Medicare Other | Admitting: Cardiovascular Disease

## 2020-10-28 ENCOUNTER — Other Ambulatory Visit: Payer: Self-pay

## 2020-10-28 VITALS — BP 126/70 | HR 73 | Resp 20 | Ht 69.0 in | Wt 280.0 lb

## 2020-10-28 DIAGNOSIS — I350 Nonrheumatic aortic (valve) stenosis: Secondary | ICD-10-CM | POA: Diagnosis not present

## 2020-10-28 DIAGNOSIS — J9621 Acute and chronic respiratory failure with hypoxia: Secondary | ICD-10-CM

## 2020-10-28 DIAGNOSIS — R600 Localized edema: Secondary | ICD-10-CM

## 2020-10-28 DIAGNOSIS — R609 Edema, unspecified: Secondary | ICD-10-CM

## 2020-10-28 NOTE — Progress Notes (Signed)
CARDIOLOGY CONSULT NOTE       Patient ID: Vernon Campbell MRN: [id number] DOB/AGE: 28-Apr-1940 80 y.o.  Admit date: (Not on file) Referring Physician: Standley Dakins Primary Physician: Pcp, No Primary Cardiologist: New Reason for Consultation: Aortic Stenosis   Active Problems:   * No active hospital problems. *   HPI:  80 y.o. 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.after some diuresis . ? 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  Social History   Socioeconomic History   Marital status: Single    Spouse name: Not on file   Number of children: Not on file   Years of education: Not on file   Highest education level: Not on file  Occupational History   Not on file  Tobacco Use   Smoking status: Former    Types: Cigarettes   Smokeless tobacco: Never  Vaping Use   Vaping Use: Never used  Substance and Sexual Activity   Alcohol use: Not Currently   Drug use: Not Currently   Sexual activity: Not Currently  Other Topics Concern   Not on file  Social History Narrative   Not on file   Social Determinants of Health   Financial Resource Strain: Not on file  Food Insecurity: Not on file  Transportation Needs: Not on file  Physical Activity: Not on file  Stress: Not on file  Social Connections: Not on file  Intimate Partner Violence: Not on file    No past surgical history on file.    Current Outpatient Medications:    atorvastatin (LIPITOR) 20 MG tablet, Take 20 mg by mouth daily., Disp: , Rfl:  cholecalciferol (VITAMIN D3) 25 MCG (1000 UNIT) tablet, Take 1,000 Units by mouth daily., Disp: , Rfl:    ergocalciferol (VITAMIN D2) 1.25 MG (50000 UT) capsule, Take 50,000 Units by mouth once a week., Disp: , Rfl:    ketoconazole (NIZORAL) 2 % shampoo, Apply 1 application topically 2 (two) times a week. Shampoo on Tuesday. Bathe on Friday., Disp: , Rfl:    lactulose (CHRONULAC) 10 GM/15ML solution, Take 10 g by mouth daily as needed for mild constipation., Disp: , Rfl:    levothyroxine (SYNTHROID) 100 MCG tablet, Take 100 mcg by mouth daily before breakfast., Disp: , Rfl:    polyethylene glycol (MIRALAX / GLYCOLAX) 17 g packet, Take 17 g by mouth daily., Disp: , Rfl:    potassium chloride SA (KLOR-CON) 20 MEQ  tablet, Take 20 mEq by mouth 2 (two) times daily., Disp: , Rfl:    pramipexole (MIRAPEX) 1 MG tablet, Take 1 mg by mouth at bedtime., Disp: , Rfl:    sennosides-docusate sodium (SENOKOT-S) 8.6-50 MG tablet, Take 2 tablets by mouth at bedtime., Disp: , Rfl:    spironolactone (ALDACTONE) 25 MG tablet, Take 25 mg by mouth daily., Disp: , Rfl:    torsemide (DEMADEX) 20 MG tablet, Take 3 tablets (60 mg total) by mouth daily., Disp: , Rfl:     Physical Exam: There were no vitals taken for this visit.    Affect appropriate Healthy:  appears stated age HEENT: normal Neck supple with no adenopathy JVP normal no bruits no thyromegaly Lungs clear with no wheezing and good diaphragmatic motion Heart:  S1/S2 preserved AS murmur, no rub, gallop or click PMI normal Abdomen: benighn, BS positve, no tenderness, no AAA no bruit.  No HSM or HJR Distal pulses intact with no bruits Plus 3 chronic stasis  edema Neuro non-focal Skin warm and dry No muscular weakness   Labs:   Lab Results  Component Value Date   WBC 7.8 10/02/2020   HGB 16.2 10/02/2020   HCT 53.3 (H) 10/02/2020   MCV 111.0 (H) 10/02/2020   PLT 163 10/02/2020   No results for input(s): NA, K, CL, CO2, BUN, CREATININE, CALCIUM, PROT, BILITOT, ALKPHOS, ALT, AST, GLUCOSE in the last 168 hours.  Invalid input(s): LABALBU No results found for: CKTOTAL, CKMB, CKMBINDEX, TROPONINI No results found for: CHOL No results found for: HDL No results found for: LDLCALC No results found for: TRIG No results found for: CHOLHDL No results found for: LDLDIRECT    Radiology: DG Pelvis Portable  Result Date: 10/01/2020 CLINICAL DATA:  Right groin pain. EXAM: PORTABLE PELVIS 1-2 VIEWS COMPARISON:  None. FINDINGS: Both hips are normally located. Moderate joint space narrowing. No fracture or bone lesion. The pubic symphysis and SI joints are intact. No pelvic fractures or bone lesions. Moderate stool noted throughout the colon and down into the  rectum could suggest constipation. IMPRESSION: Moderate bilateral hip joint space narrowing but no acute bony findings. Electronically Signed   By: Rudie Meyer M.D.   On: 10/01/2020 18:59   US Venous Img Lower Unilateral Right (DVT)  Result Date: 10/02/2020 CLINICAL DATA:  Pain, edema, calf discoloration, history of tobacco abuse EXAM: RIGHT LOWER EXTREMITY VENOUS DOPPLER ULTRASOUND TECHNIQUE: Gray-scale sonography with compression, as well as color and duplex ultrasound, were performed to evaluate the deep venous system(s) from the level of the common femoral vein through the popliteal and proximal calf veins. COMPARISON:  None. FINDINGS: VENOUS Normal compressibility of the common femoral, superficial femoral, and popliteal veins, as  well as the visualized calf veins. Visualized portions of profunda femoral vein and great saphenous vein unremarkable. No filling defects to suggest DVT on grayscale or color Doppler imaging. Doppler waveforms show normal direction of venous flow, normal respiratory phasicity and response to augmentation. Limited views of the contralateral common femoral vein are unremarkable. OTHER None. Limitations: Technologist describes technically difficult study secondary to large body habitus. IMPRESSION: No femoropopliteal DVT nor evidence of DVT within the visualized calf veins. If clinical symptoms are inconsistent or if there are persistent or worsening symptoms, further imaging (possibly involving the iliac veins) may be warranted. Electronically Signed   By: Corlis Leak  Hassell M.D.   On: 10/02/2020 13:27   DG CHEST PORT 1 VIEW  Result Date: 10/03/2020 CLINICAL DATA:  Left pleural effusion, continued shortness of breath EXAM: PORTABLE CHEST 1 VIEW COMPARISON:  10/01/2020 FINDINGS: Stable cardiomediastinal contours with cardiomegaly. Persistent elevation of the left hemidiaphragm. Similar probable left pleural effusion and left basilar atelectasis. Pulmonary vascular congestion.  IMPRESSION: Similar appearance to the prior study with persistent pulmonary vascular congestion, elevation of left hemidiaphragm, and probable left pleural effusion and basilar atelectasis. Electronically Signed   By: Guadlupe SpanishPraneil  Patel M.D.   On: 10/03/2020 13:41   DG Chest Portable 1 View  Result Date: 10/01/2020 CLINICAL DATA:  Shortness of breath EXAM: PORTABLE CHEST 1 VIEW COMPARISON:  None. FINDINGS: The heart is enlarged. Mild tortuosity of the thoracic aorta. Moderate central vascular congestion without overt pulmonary edema. There is a left pleural effusion suspected and overlying atelectasis. IMPRESSION: Cardiac enlargement with vascular congestion and left pleural effusion with overlying atelectasis. Electronically Signed   By: Rudie MeyerP.  Gallerani M.D.   On: 10/01/2020 18:58   ECHOCARDIOGRAM COMPLETE  Result Date: 10/02/2020    ECHOCARDIOGRAM REPORT   Patient Name:   Remer MachoJAMES Grubb Date of Exam: 10/02/2020 Medical Rec #:  [id number]  Height:       69.0 in Accession #:    [id number] Weight:       298.1 lb Date of Birth:  [birth year]  BSA:          2.448 m Patient Age:    80 years   BP:           98/75 mmHg Patient Gender: M          HR:           72 bpm. [facility] Procedure: 2D Echo, Cardiac Doppler and Color Doppler Indications:    CHF  History:        Patient has no prior history of Echocardiogram examinations.                 CHF; Risk Factors:Dyslipidemia.  Sonographer:    Mikki Harbororothy Buchanan Referring Phys: [id number] OLADAPO ADEFESO  Sonographer Comments: Technically difficult study due to poor echo windows. Patient refused the use of Definity. IMPRESSIONS  1. Left ventricular ejection fraction, by estimation, is 55 to 60%. The left ventricle has normal function. Left ventricular endocardial border not optimally defined to evaluate regional wall motion. There is moderate left ventricular hypertrophy. Left ventricular diastolic parameters are consistent with Grade I diastolic dysfunction (impaired  relaxation).  2. Right ventricular systolic function was not well visualized. The right ventricular size is not well visualized.  3. Left atrial size was mildly dilated.  4. The mitral valve is normal in structure. No evidence of mitral valve regurgitation. No evidence of mitral stenosis.  5. The aortic valve has an indeterminant number of  cusps. There is severe calcifcation of the aortic valve. There is severe thickening of the aortic valve. Aortic valve regurgitation is not visualized. Mild to moderate aortic valve stenosis. Aortic valve mean gradient measures 17.2 mmHg. Aortic valve peak gradient measures 27.6 mmHg. Aortic valve area, by VTI measures 1.49 cm. FINDINGS  Left Ventricle: Left ventricular ejection fraction, by estimation, is 55 to 60%. The left ventricle has normal function. Left ventricular endocardial border not optimally defined to evaluate regional wall motion. The left ventricular internal cavity size was normal in size. There is moderate left ventricular hypertrophy. Left ventricular diastolic parameters are consistent with Grade I diastolic dysfunction (impaired relaxation). Normal left ventricular filling pressure. Right Ventricle: RV not well visualized, grossly appears enlarged with normal function. The right ventricular size is not well visualized. Right vetricular wall thickness was not well visualized. Right ventricular systolic function was not well visualized. Left Atrium: Left atrial size was mildly dilated. Right Atrium: Right atrial size was not well visualized. Pericardium: The pericardium was not well visualized. Mitral Valve: The mitral valve is normal in structure. There is mild thickening of the mitral valve leaflet(s). There is mild calcification of the mitral valve leaflet(s). Mild mitral annular calcification. No evidence of mitral valve regurgitation. No evidence of mitral valve stenosis. MV peak gradient, 3.6 mmHg. The mean mitral valve gradient is 1.0 mmHg. Tricuspid  Valve: The tricuspid valve is normal in structure. Tricuspid valve regurgitation is not demonstrated. No evidence of tricuspid stenosis. Aortic Valve: The aortic valve has an indeterminant number of cusps. There is severe calcifcation of the aortic valve. There is severe thickening of the aortic valve. There is severe aortic valve annular calcification. Aortic valve regurgitation is not visualized. Mild to moderate aortic stenosis is present. Aortic valve mean gradient measures 17.2 mmHg. Aortic valve peak gradient measures 27.6 mmHg. Aortic valve area, by VTI measures 1.49 cm. Pulmonic Valve: The pulmonic valve was not well visualized. Pulmonic valve regurgitation is not visualized. No evidence of pulmonic stenosis. Aorta: The aortic root is normal in size and structure. Pulmonary Artery: Indeterminant PASP, inadequate TR jet. IAS/Shunts: The interatrial septum was not well visualized.  LEFT VENTRICLE PLAX 2D LVIDd:         4.79 cm  Diastology LVIDs:         3.50 cm  LV e' medial:    6.83 cm/s LV PW:         1.36 cm  LV E/e' medial:  10.2 LV IVS:        1.28 cm  LV e' lateral:   10.10 cm/s LVOT diam:     2.00 cm  LV E/e' lateral: 6.9 LV SV:         101 LV SV Index:   41 LVOT Area:     3.14 cm  RIGHT VENTRICLE RV Basal diam:  5.83 cm RV Mid diam:    5.50 cm TAPSE (M-mode): 3.2 cm LEFT ATRIUM             Index       RIGHT ATRIUM           Index LA diam:        4.80 cm 1.96 cm/m  RA Area:     31.20 cm LA Vol (A2C):   90.2 ml 36.85 ml/m RA Volume:   111.00 ml 45.35 ml/m LA Vol (A4C):   76.8 ml 31.38 ml/m LA Biplane Vol: 92.7 ml 37.87 ml/m  AORTIC VALVE AV Area (Vmax):  1.28 cm AV Area (Vmean):   1.33 cm AV Area (VTI):     1.49 cm AV Vmax:           262.72 cm/s AV Vmean:          196.311 cm/s AV VTI:            0.677 m AV Peak Grad:      27.6 mmHg AV Mean Grad:      17.2 mmHg LVOT Vmax:         107.00 cm/s LVOT Vmean:        83.400 cm/s LVOT VTI:          0.320 m LVOT/AV VTI ratio: 0.47  AORTA Ao Root diam:  3.80 cm MITRAL VALVE MV Area (PHT): 2.56 cm    SHUNTS MV Area VTI:   3.69 cm    Systemic VTI:  0.32 m MV Peak grad:  3.6 mmHg    Systemic Diam: 2.00 cm MV Mean grad:  1.0 mmHg MV Vmax:       0.94 m/s MV Vmean:      55.0 cm/s MV Decel Time: 296 msec MV E velocity: 69.90 cm/s MV A velocity: 89.10 cm/s MV E/A ratio:  0.78 Dina Rich MD Electronically signed by Dina Rich MD Signature Date/Time: 10/02/2020/11:37:45 AM    Final     EKG: 10/01/20 SR RBBB no acute changes    ASSESSMENT AND PLAN:   Aortic Stenosis:  should not be clinically significant with mean gradient 17 and AVA 1.5 cm2 Given age and co-morbidities may not be a TAVR candidate in future anyway Can consider f/u echo in a year Chronic respiratory failure:  ? Recent admission with worsening from aspiration Needs careful attention to swallowing mechanism ? SLP study Continue oxygen f/u primary  Hypothyroidism:  continue synthroid replacement TSH normal  HLD:  continue statin no documented vascular disease  Edema:  chronic with normal BNP and EF doubt from CHF more obesity and chronic venous disease continue demedex and aldacton   F/U cardiology PRN   Signed: Charlton Haws 10/28/2020, 10:48 AM

## 2020-10-28 NOTE — Patient Instructions (Signed)
Medication Instructions:  Your physician recommends that you continue on your current medications as directed. Please refer to the Current Medication list given to you today.  *If you need a refill on your cardiac medications before your next appointment, please call your pharmacy*   Lab Work: None today  If you have labs (blood work) drawn today and your tests are completely normal, you will receive your results only by: MyChart Message (if you have MyChart) OR A paper copy in the mail If you have any lab test that is abnormal or we need to change your treatment, we will call you to review the results.   Testing/Procedures: Your physician has requested that you have an echocardiogram in 1 year . Echocardiography is a painless test that uses sound waves to create images of your heart. It provides your doctor with information about the size and shape of your heart and how well your heart's chambers and valves are working. This procedure takes approximately one hour. There are no restrictions for this procedure.    Follow-Up: At [facility], you and your health needs are our priority.  As part of our continuing mission to provide you with exceptional heart care, we have created designated Provider Care Teams.  These Care Teams include your primary Cardiologist (physician) and Advanced Practice Providers (APPs -  Physician Assistants and Nurse Practitioners) who all work together to provide you with the care you need, when you need it.  We recommend signing up for the patient portal called "MyChart".  Sign up information is provided on this After Visit Summary.  MyChart is used to connect with patients for Virtual Visits (Telemedicine).  Patients are able to view lab/test results, encounter notes, upcoming appointments, etc.  Non-urgent messages can be sent to your provider as well.   To learn more about what you can do with MyChart, go to [email or web address].    Your next appointment:    12 month(s)  The format for your next appointment:   In Person  Provider:   Charlton Haws, MD   Other Instructions None

## 2020-12-08 ENCOUNTER — Emergency Department (HOSPITAL_COMMUNITY): Payer: Medicare Other

## 2020-12-08 ENCOUNTER — Other Ambulatory Visit: Payer: Self-pay

## 2020-12-08 ENCOUNTER — Emergency Department (HOSPITAL_COMMUNITY)
Admission: EM | Admit: 2020-12-08 | Discharge: 2020-12-08 | Disposition: A | Discharge status: Home / Self Care | Payer: Medicare Other | Attending: Emergency Medicine | Admitting: Emergency Medicine

## 2020-12-08 ENCOUNTER — Encounter (HOSPITAL_COMMUNITY): Payer: Self-pay | Admitting: Emergency Medicine

## 2020-12-08 DIAGNOSIS — Z23 Encounter for immunization: Secondary | ICD-10-CM | POA: Insufficient documentation

## 2020-12-08 DIAGNOSIS — Y92002 Bathroom of unspecified non-institutional (private) residence single-family (private) house as the place of occurrence of the external cause: Secondary | ICD-10-CM | POA: Insufficient documentation

## 2020-12-08 DIAGNOSIS — W01198A Fall on same level from slipping, tripping and stumbling with subsequent striking against other object, initial encounter: Secondary | ICD-10-CM | POA: Insufficient documentation

## 2020-12-08 DIAGNOSIS — E039 Hypothyroidism, unspecified: Secondary | ICD-10-CM | POA: Insufficient documentation

## 2020-12-08 DIAGNOSIS — I110 Hypertensive heart disease with heart failure: Secondary | ICD-10-CM | POA: Diagnosis not present

## 2020-12-08 DIAGNOSIS — S81811A Laceration without foreign body, right lower leg, initial encounter: Secondary | ICD-10-CM | POA: Diagnosis not present

## 2020-12-08 DIAGNOSIS — S61412A Laceration without foreign body of left hand, initial encounter: Secondary | ICD-10-CM | POA: Diagnosis not present

## 2020-12-08 DIAGNOSIS — S8991XA Unspecified injury of right lower leg, initial encounter: Secondary | ICD-10-CM | POA: Diagnosis present

## 2020-12-08 DIAGNOSIS — Z87891 Personal history of nicotine dependence: Secondary | ICD-10-CM | POA: Insufficient documentation

## 2020-12-08 DIAGNOSIS — S51012A Laceration without foreign body of left elbow, initial encounter: Secondary | ICD-10-CM | POA: Diagnosis not present

## 2020-12-08 DIAGNOSIS — Z79899 Other long term (current) drug therapy: Secondary | ICD-10-CM | POA: Insufficient documentation

## 2020-12-08 DIAGNOSIS — I5032 Chronic diastolic (congestive) heart failure: Secondary | ICD-10-CM | POA: Diagnosis not present

## 2020-12-08 DIAGNOSIS — R600 Localized edema: Secondary | ICD-10-CM | POA: Diagnosis not present

## 2020-12-08 MED ORDER — TETANUS-DIPHTH-ACELL PERTUSSIS 5-2.5-18.5 LF-MCG/0.5 IM SUSY
0.5000 mL | PREFILLED_SYRINGE | Freq: Once | INTRAMUSCULAR | Status: AC
  Administered 2020-12-08: 0.5 mL via INTRAMUSCULAR
  Filled 2020-12-08: qty 0.5

## 2020-12-08 MED ORDER — LIDOCAINE-EPINEPHRINE (PF) 1 %-1:200000 IJ SOLN
30.0000 mL | Freq: Once | INTRAMUSCULAR | Status: AC
  Administered 2020-12-08: 30 mL
  Filled 2020-12-08: qty 30

## 2020-12-08 MED ORDER — CEPHALEXIN 500 MG PO CAPS: 500.0000 mg | ORAL_CAPSULE | Freq: Three times a day (TID) | ORAL | 0 refills | Status: DC

## 2020-12-08 MED ORDER — POVIDONE-IODINE 10 % EX SOLN
Freq: Once | CUTANEOUS | Status: AC
  Filled 2020-12-08: qty 15

## 2020-12-08 MED ORDER — BACITRACIN ZINC 500 UNIT/GM EX OINT: 1.0000 "application " | TOPICAL_OINTMENT | Freq: Two times a day (BID) | CUTANEOUS | 0 refills | Status: DC

## 2020-12-08 NOTE — ED Triage Notes (Signed)
Pt from [facility] after falling and sustaining a laceration to R lower extremity after hitting his leg on his walker.. Pt also has skin tears to bilateral elbows, L hand, and R knee as a result of fall.

## 2020-12-08 NOTE — ED Provider Notes (Signed)
[facility] EMERGENCY DEPARTMENT Provider Note   CSN: [id number] Arrival date & time: 12/08/20  0050     History Chief Complaint  Patient presents with   Laceration    Korben Carcione is a 80 y.o. male.  Patient from his facility after a fall.  States he lost his balance while in the bathroom and fell striking his right leg against his walker.  Sustained a laceration to his right shin as well as skin tears to his bilateral elbows, left hand and right knee.  Denies hitting his head or losing consciousness.  Remembers the whole fall.  No preceding dizziness or lightheadedness.  No chest pain or shortness of breath.  No abdominal pain.  No nausea or vomiting. Denies any blood thinner use.  Patient with history of CHF with diastolic failure, obesity, hyperlipidemia, sleep apnea, atrial fibrillation, pulmonary hypertension.  The history is provided by the patient.  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)    Hypokalemia    Hypothyroidism, adult    Ileus (HCC)    Leg pain, diffuse    Muscle weakness (generalized)    Obstructive sleep apnea syndrome    Osteoarthritis, unspecified osteoarthritis type, unspecified site    Other seborrheic keratosis    Paroxysmal atrial fibrillation (HCC)    Peripheral venous insufficiency    Pulmonary hypertension (HCC)     Recurrent major depression (HCC)    Restless leg syndrome    Vitamin D deficiency, unspecified    Vitreous degeneration of left eye     Patient Active Problem List   Diagnosis Date Noted   Acute exacerbation of CHF (congestive heart failure) (HCC) 10/01/2020   Hyperlipidemia 10/01/2020   Hypothyroidism 10/01/2020   Macrocytic anemia 10/01/2020   AKI (acute kidney injury) (HCC) 10/01/2020   Obesity, Class III, BMI 40-49.9 (morbid obesity) (HCC) 10/01/2020   Acute on chronic respiratory failure with hypoxia (HCC) 10/01/2020   Right thigh pain 10/01/2020   Right groin pain 10/01/2020   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 No pertinent surgical history.     History reviewed. No pertinent family history.  Social History   Tobacco Use   Smoking status: Former    Types: Cigarettes   Smokeless tobacco: Never  Vaping Use   Vaping Use: Never used  Substance Use Topics   Alcohol use: Not Currently   Drug use: Not Currently    Home Medications Prior to Admission medications   Medication Sig Start Date End Date Taking? Authorizing Provider  atorvastatin (LIPITOR) 20 MG tablet Take 20 mg by mouth daily.    [provider]  cholecalciferol (VITAMIN D3) 25 MCG (1000 UNIT) tablet Take 1,000 Units by mouth daily.    [provider]  ergocalciferol (VITAMIN D2) 1.25 MG (  50000 UT) capsule Take 50,000 Units by mouth once a week.    [provider]  ketoconazole (NIZORAL) 2 % shampoo Apply 1 application topically 2 (two) times a week. Shampoo on Tuesday. Bathe on Friday.    [provider]  lactulose (CHRONULAC) 10 GM/15ML solution Take 10 g by mouth daily as needed for mild constipation.    [provider]  levothyroxine (SYNTHROID) 100 MCG tablet Take 100 mcg by mouth daily before breakfast.    [provider]  polyethylene glycol (MIRALAX / GLYCOLAX) 17 g packet Take 17 g by mouth daily.    [provider]  potassium chloride SA (KLOR-CON) 20 MEQ tablet Take 20 mEq by mouth 2 (two) times daily.    [provider]  pramipexole (MIRAPEX) 1 MG tablet Take 1 mg by mouth at bedtime.    [provider]  sennosides-docusate sodium (SENOKOT-S) 8.6-50 MG tablet Take 2 tablets by mouth at bedtime.    [provider]  spironolactone (ALDACTONE) 25 MG tablet Take 25 mg by mouth daily.    [provider]  torsemide (DEMADEX) 20 MG tablet Take 3 tablets (60 mg total) by mouth daily. 10/04/20   Cleora Fleet, MD    Allergies    Patient has no known allergies.  Review of Systems   Review of Systems  Constitutional:  Negative for activity change, appetite change and fever.  HENT:  Negative for congestion and rhinorrhea.   Respiratory:  Negative for chest tightness and shortness of breath.   Gastrointestinal:  Negative for abdominal pain, nausea and vomiting.  Musculoskeletal:  Positive for arthralgias and myalgias.  Skin:  Positive for wound.  Neurological:  Negative for dizziness, weakness and headaches.   all other systems are negative except as noted in the HPI and PMH.   Physical Exam Updated Vital Signs BP 96/63   Pulse 77   Temp 97.9 F (36.6 C) (Oral)   Resp 18   Ht 5\' 9"  (1.753 m)   Wt 127 kg   SpO2 90%   BMI 41.35 kg/m   Physical Exam Vitals and nursing note reviewed.  Constitutional:      General: He is not in acute distress.    Appearance: He is well-developed.  HENT:     Head: Normocephalic and atraumatic.     Mouth/Throat:     Pharynx: No oropharyngeal exudate.  Eyes:     Conjunctiva/sclera: Conjunctivae normal.     Pupils: Pupils are equal, round, and reactive to light.  Neck:     Comments: No meningismus. Cardiovascular:     Rate and Rhythm: Normal rate and regular rhythm.     Heart sounds: Normal heart sounds. No murmur heard. Pulmonary:     Effort: Pulmonary effort is normal. No respiratory distress.     Breath sounds:  Normal breath sounds.  Abdominal:     Palpations: Abdomen is soft.     Tenderness: There is no abdominal tenderness. There is no guarding or rebound.  Musculoskeletal:        General: No tenderness. Normal range of motion.     Cervical back: Normal range of motion and neck supple.     Right lower leg: Edema present.     Left lower leg: Edema present.     Comments: Laceration to right shin approximately 10 cm through adipose tissue.  Bleeding controlled.  Abrasion right knee.  Intact DP and PT pulses bilaterally.  Extensive leg swelling which patient states is  baseline.  Skin tear left hand and left elbow.  Skin:    General: Skin is warm.  Neurological:     Mental Status: He is alert and oriented to person, place, and time.     Cranial Nerves: No cranial nerve deficit.     Motor: No abnormal muscle tone.     Coordination: Coordination normal.     Comments:  5/5 strength throughout. CN 2-12 intact.Equal grip strength.   Psychiatric:        Behavior: Behavior normal.     ED Results / Procedures / Treatments   Labs (all labs ordered are listed, but only abnormal results are displayed) Labs Reviewed - No data to display  EKG None  Radiology DG Elbow Complete Left  Result Date: 12/08/2020 CLINICAL DATA:  Laceration after a fall. EXAM: LEFT ELBOW - COMPLETE 3+ VIEW COMPARISON:  None. FINDINGS: No evidence of acute fracture or dislocation of the left elbow. Small olecranon spur. No focal bone lesion or bone destruction. No significant effusions. Soft tissues are unremarkable. IMPRESSION: No acute bony abnormalities. Electronically Signed   By: Burman Nieves M.D.   On: 12/08/2020 02:21   DG Tibia/Fibula Right  Result Date: 12/08/2020 CLINICAL DATA:  Laceration after a fall. EXAM: RIGHT TIBIA AND FIBULA - 2 VIEW COMPARISON:  None. FINDINGS: Prominent soft tissue laceration demonstrated in the lateral right lower leg soft tissues. No radiopaque soft tissue foreign bodies. No  underlying bone lesions. No evidence of acute fracture or dislocation. Previous right knee arthroplasty. Vascular calcifications. IMPRESSION: Soft tissue laceration in the lateral right lower leg. No radiopaque foreign bodies. No acute bony abnormalities. Electronically Signed   By: Burman Nieves M.D.   On: 12/08/2020 02:17   DG Chest Portable 1 View  Result Date: 12/08/2020 CLINICAL DATA:  Hypoxia.  [employer]. EXAM: PORTABLE CHEST 1 VIEW COMPARISON:  10/03/2020 FINDINGS: Shallow inspiration. Cardiac enlargement with mild pulmonary vascular congestion. Infiltration or atelectasis in the left lung base with fluid or thickened pleura in the left costophrenic angle. Findings are similar to the prior study. No pneumothorax. Mediastinal contours appear intact. IMPRESSION: Chronic infiltration or atelectasis in the left base with fluid or thickened pleura in the left costophrenic angle. Cardiac enlargement with vascular congestion. Similar appearance to previous study. Electronically Signed   By: Burman Nieves M.D.   On: 12/08/2020 02:23   DG Knee Complete 4 Views Right  Result Date: 12/08/2020 CLINICAL DATA:  Laceration after a fall. EXAM: RIGHT KNEE - COMPLETE 4+ VIEW COMPARISON:  None. FINDINGS: Right total knee arthroplasty. Oblique positioning on the lateral view limits evaluation but the visualized components appear well seated. A patella femoral component is present. No evidence of acute fracture or dislocation. No focal bone lesions. No significant effusions. Vascular calcifications. IMPRESSION: Right total knee arthroplasty. Components appear well seated. No acute bony abnormalities. Electronically Signed   By: Burman Nieves M.D.   On: 12/08/2020 02:18   DG Hand Complete Left  Result Date: 12/08/2020 CLINICAL DATA:  Laceration after a fall. EXAM: LEFT HAND - COMPLETE 3+ VIEW COMPARISON:  None. FINDINGS: Degenerative changes in the interphalangeal joints and STT joints. No evidence of acute  fracture or dislocation. No focal bone lesion or bone destruction. Soft tissues are unremarkable. IMPRESSION: Degenerative changes.  No acute bony abnormalities. Electronically Signed   By: Burman Nieves M.D.   On: 12/08/2020 02:19    Procedures .Marland KitchenLaceration Repair  Date/Time: 12/08/2020 3:20 AM Performed by: Glynn Octave, MD Authorized by: Manus Gunning,  Jeannett Senior, MD   Consent:    Consent obtained:  Verbal   Consent given by:  Patient   Risks, benefits, and alternatives were discussed: yes     Risks discussed:  Infection, need for additional repair, poor wound healing, poor cosmetic result and pain   Alternatives discussed:  No treatment Universal protocol:    Procedure explained and questions answered to patient or proxy's satisfaction: yes     Imaging studies available: yes     Patient identity confirmed:  Verbally with patient Anesthesia:    Anesthesia method:  Local infiltration   Local anesthetic:  Lidocaine 2% WITH epi Laceration details:    Location:  Leg   Leg location:  L lower leg Pre-procedure details:    Preparation:  Patient was prepped and draped in usual sterile fashion and imaging obtained to evaluate for foreign bodies Exploration:    Hemostasis achieved with:  Epinephrine and direct pressure   Imaging obtained: x-ray     Imaging outcome: foreign body not noted     Wound exploration: wound explored through full range of motion and entire depth of wound visualized     Wound extent: areolar tissue violated and fascia violated     Wound extent: no underlying fracture noted     Contaminated: no   Treatment:    Area cleansed with:  Povidone-iodine   Amount of cleaning:  Standard   Irrigation solution:  Sterile saline   Irrigation volume:  3000   Irrigation method:  Pressure wash   Visualized foreign bodies/material removed: no     Debridement:  Moderate   Scar revision: no   Skin repair:    Repair method:  Sutures   Suture size:  3-0   Suture material:   Nylon   Suture technique:  Simple interrupted (Horizontal mattress, 14 simple interrupted)   Number of sutures:  16 Repair type:    Repair type:  Complex Post-procedure details:    Dressing:  Antibiotic ointment and adhesive bandage   Procedure completion:  Tolerated   Medications Ordered in ED Medications  Tdap (BOOSTRIX) injection 0.5 mL (has no administration in time range)  lidocaine-EPINEPHrine (XYLOCAINE-EPINEPHrine) 1 %-1:200000 (PF) injection 30 mL (has no administration in time range)  povidone-iodine (BETADINE) 10 % external solution (has no administration in time range)    ED Course  I have reviewed the triage vital signs and the nursing notes.  Pertinent labs & imaging results that were available during my care of the patient were reviewed by me and considered in my medical decision making (see chart for details).    MDM Rules/Calculators/A&P                           fall with skin tears and laceration to right shin.  Denies head injury.  No blood thinner use  Patient adamant he did not hit his head.  Declines head CT.  X-ray of extremities are negative for fracture.  Large laceration of right leg.  As above after extensive irrigation.  Patient to be given prophylactic antibiotics.  Tetanus updated.  Topical bacitracin and p.o. antibiotics.  Wound care instructions given.  Suture removal in 7 to 10 days.  Patient with some borderline O2 saturations in the low 90s.  Discharge summary from July shows patient was discharged on 3 to 4 L of oxygen for diastolic heart failure and is on torsemide 60 mg daily.  He states is comfortable and feels that  his breathing is normal.  He states his leg swelling is normal. States he continues to wear oxygen at this facility as needed. Chest x-ray today shows stable congestion and left-sided atelectasis.  He has had no increased work of breathing and is resting comfortably on room air.  Patient stable to return to his facility.   Suture removal in 7 to 10 days.  Return precautions given Final Clinical Impression(s) / ED Diagnoses Final diagnoses:  Leg laceration, right, initial encounter    Rx / DC Orders ED Discharge Orders     None        Kareemah Grounds, Jeannett Senior, MD 12/08/20 [phone]

## 2020-12-08 NOTE — Discharge Instructions (Addendum)
X-rays are negative.  Keep wound clean and dry.  Sutures should be removed in 7 to 10 days by your primary doctor.  Apply bacitracin twice daily. Return to the ED with worsening symptoms

## 2020-12-19 ENCOUNTER — Emergency Department (HOSPITAL_COMMUNITY)
Admission: EM | Admit: 2020-12-19 | Discharge: 2020-12-20 | Disposition: A | Discharge status: Home / Self Care | Payer: Medicare Other | Attending: Emergency Medicine | Admitting: Emergency Medicine

## 2020-12-19 ENCOUNTER — Other Ambulatory Visit: Payer: Self-pay

## 2020-12-19 ENCOUNTER — Encounter (HOSPITAL_COMMUNITY): Payer: Self-pay | Admitting: Emergency Medicine

## 2020-12-19 DIAGNOSIS — E039 Hypothyroidism, unspecified: Secondary | ICD-10-CM | POA: Insufficient documentation

## 2020-12-19 DIAGNOSIS — Y848 Other medical procedures as the cause of abnormal reaction of the patient, or of later complication, without mention of misadventure at the time of the procedure: Secondary | ICD-10-CM | POA: Insufficient documentation

## 2020-12-19 DIAGNOSIS — I5032 Chronic diastolic (congestive) heart failure: Secondary | ICD-10-CM | POA: Diagnosis not present

## 2020-12-19 DIAGNOSIS — I110 Hypertensive heart disease with heart failure: Secondary | ICD-10-CM | POA: Diagnosis not present

## 2020-12-19 DIAGNOSIS — T8130XA Disruption of wound, unspecified, initial encounter: Secondary | ICD-10-CM

## 2020-12-19 DIAGNOSIS — T8131XA Disruption of external operation (surgical) wound, not elsewhere classified, initial encounter: Secondary | ICD-10-CM | POA: Diagnosis present

## 2020-12-19 DIAGNOSIS — Z79899 Other long term (current) drug therapy: Secondary | ICD-10-CM | POA: Insufficient documentation

## 2020-12-19 LAB — CBC WITH DIFFERENTIAL/PLATELET
Abs Immature Granulocytes: 0.34 10*3/uL — ABNORMAL HIGH (ref 0.00–0.07)
Basophils Absolute: 0.2 10*3/uL — ABNORMAL HIGH (ref 0.0–0.1)
Basophils Relative: 1 %
Eosinophils Absolute: 0.4 10*3/uL (ref 0.0–0.5)
Eosinophils Relative: 3 %
HCT: 42.3 % (ref 39.0–52.0)
Hemoglobin: 13.7 g/dL (ref 13.0–17.0)
Immature Granulocytes: 3 %
Lymphocytes Relative: 20 %
Lymphs Abs: 2.2 10*3/uL (ref 0.7–4.0)
MCH: 34.3 pg — ABNORMAL HIGH (ref 26.0–34.0)
MCHC: 32.4 g/dL (ref 30.0–36.0)
MCV: 105.8 fL — ABNORMAL HIGH (ref 80.0–100.0)
Monocytes Absolute: 1.2 10*3/uL — ABNORMAL HIGH (ref 0.1–1.0)
Monocytes Relative: 11 %
Neutro Abs: 6.7 10*3/uL (ref 1.7–7.7)
Neutrophils Relative %: 62 %
Platelets: 254 10*3/uL (ref 150–400)
RBC: 4 MIL/uL — ABNORMAL LOW (ref 4.22–5.81)
RDW: 13.4 % (ref 11.5–15.5)
WBC: 11 10*3/uL — ABNORMAL HIGH (ref 4.0–10.5)
nRBC: 0 % (ref 0.0–0.2)

## 2020-12-19 LAB — BASIC METABOLIC PANEL
Anion gap: 6 (ref 5–15)
BUN: 21 mg/dL (ref 8–23)
CO2: 30 mmol/L (ref 22–32)
Calcium: 8.9 mg/dL (ref 8.9–10.3)
Chloride: 104 mmol/L (ref 98–111)
Creatinine, Ser: 1 mg/dL (ref 0.61–1.24)
GFR, Estimated: >60 mL/min (ref >60)
Glucose, Bld: 91 mg/dL (ref 70–99)
Potassium: 4.5 mmol/L (ref 3.5–5.1)
Sodium: 140 mmol/L (ref 135–145)

## 2020-12-19 MED ORDER — OXYCODONE-ACETAMINOPHEN 5-325 MG PO TABS
1.0000 | ORAL_TABLET | Freq: Once | ORAL | Status: AC
Start: 2020-12-19 — End: 2020-12-19
  Administered 2020-12-19: 1 via ORAL
  Filled 2020-12-19: qty 1

## 2020-12-19 MED ORDER — DOXYCYCLINE HYCLATE 100 MG PO CAPS: 100.0000 mg | ORAL_CAPSULE | Freq: Two times a day (BID) | ORAL | 0 refills | Status: DC

## 2020-12-19 MED ORDER — TRAZODONE HCL 50 MG PO TABS
50.0000 mg | ORAL_TABLET | Freq: Every day | ORAL | Status: DC
  Administered 2020-12-19: 50 mg via ORAL
  Filled 2020-12-19: qty 1

## 2020-12-19 NOTE — ED Notes (Signed)
Assisted PA in wound cleansing, stitches removed, and debridement. Wound redressed with nonadherent dressing and curlex.

## 2020-12-19 NOTE — Discharge Instructions (Addendum)
Your stitches have been removed today.  Your wound was cleaned out and fresh dressings applied.  I am starting you on a new antibiotic that will hopefully clear out any infection.  Continue to have your living facility apply clean dressings to the area.  You may follow-up with the office attached to these discharge papers next week to reevaluate your wound.  It was an absolute pleasure to meet you today and I hope that you feel better.

## 2020-12-19 NOTE — ED Triage Notes (Signed)
Pt arrives from [facility] with c/o wound dehiscence of right lower leg and reports 3 stitches fell out. Hit leg on walker on 9/20 and came to ED. Completed keflex abx on 9/26. No c/o of pain at this time. Legs red and weeping bilaterally below knee.

## 2020-12-19 NOTE — ED Provider Notes (Signed)
[facility] EMERGENCY DEPARTMENT Provider Note   CSN: [id number] Arrival date & time: 12/19/20  1601     History Chief Complaint  Patient presents with   Wound Dehiscence    [facility] is a 80 y.o. male with a past medical history of chronic venous stasis presenting today with a complaint of a poorly healing laceration to his right leg. On 9/19 patient was evaluated in the emergency department after a fall onto his wheelchair.  He had a large laceration to his right lower extremity that was repaired with 16 sutures.  Patient was to have the sutures removed in 7 to 10 days however he lives at a facility and they did not remove the sutures because they felt as though the wound was not fully closed.  Today he is presenting from his facility with a concern for infection.  Patient denies fever, chills or increased numbness and tingling in his feet.  Reports that his leg swelling is at baseline.   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)    Hypokalemia    Hypothyroidism, adult    Ileus (HCC)    Leg pain, diffuse    Muscle weakness (generalized)    Obstructive sleep apnea syndrome    Osteoarthritis, unspecified osteoarthritis type, unspecified site    Other seborrheic keratosis    Paroxysmal atrial fibrillation (HCC)    Peripheral venous insufficiency    Pulmonary hypertension (HCC)    Recurrent major  depression (HCC)    Restless leg syndrome    Vitamin D deficiency, unspecified    Vitreous degeneration of left eye     Patient Active Problem List   Diagnosis Date Noted   Acute exacerbation of CHF (congestive heart failure) (HCC) 10/01/2020   Hyperlipidemia 10/01/2020   Hypothyroidism 10/01/2020   Macrocytic anemia 10/01/2020   AKI (acute kidney injury) (HCC) 10/01/2020   Obesity, Class III, BMI 40-49.9 (morbid obesity) (HCC) 10/01/2020   Acute on chronic respiratory failure with hypoxia (HCC) 10/01/2020   Right thigh pain 10/01/2020   Right groin pain 10/01/2020   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     No family history on file.  Social History   Tobacco Use   Smoking status: Former    Types: Cigarettes   Smokeless tobacco: Never  Vaping Use   Vaping Use: Never used  Substance Use Topics   Alcohol use: Not Currently   Drug use: Not Currently    Home Medications Prior to Admission medications   Medication Sig Start Date End Date Taking? Authorizing Provider  atorvastatin (LIPITOR) 20 MG tablet Take 20 mg by mouth daily.    [provider]  bacitracin ointment Apply 1 application topically 2 (two) times daily. 12/08/20   Rancour, Jeannett Senior, MD  cephALEXin (KEFLEX) 500 MG capsule Take 1  capsule (500 mg total) by mouth 3 (three) times daily. 12/08/20   Rancour, Jeannett Senior, MD  cholecalciferol (VITAMIN D3) 25 MCG (1000 UNIT) tablet Take 1,000 Units by mouth daily.    [provider]  ergocalciferol (VITAMIN D2) 1.25 MG (50000 UT) capsule Take 50,000 Units by mouth once a week.    [provider]  ketoconazole (NIZORAL) 2 % shampoo Apply 1 application topically 2 (two) times a week. Shampoo on Tuesday. Bathe on Friday.    [provider]  lactulose (CHRONULAC) 10 GM/15ML solution Take 10 g by mouth daily as needed for mild constipation.    [provider]  levothyroxine (SYNTHROID) 100 MCG  tablet Take 100 mcg by mouth daily before breakfast.    [provider]  polyethylene glycol (MIRALAX / GLYCOLAX) 17 g packet Take 17 g by mouth daily.    [provider]  potassium chloride SA (KLOR-CON) 20 MEQ tablet Take 20 mEq by mouth 2 (two) times daily.    [provider]  pramipexole (MIRAPEX) 1 MG tablet Take 1 mg by mouth at bedtime.    [provider]  sennosides-docusate sodium (SENOKOT-S) 8.6-50 MG tablet Take 2 tablets by mouth at bedtime.    [provider]  spironolactone (ALDACTONE) 25 MG tablet Take 25 mg by mouth daily.    [provider]  torsemide (DEMADEX) 20 MG tablet Take 3 tablets (60 mg total) by mouth daily. 10/04/20   Cleora Fleet, MD    Allergies    Patient has no known allergies.  Review of Systems   Review of Systems  Constitutional:  Negative for chills and fever.  Musculoskeletal:  Negative for gait problem.  Skin:  Positive for wound. Negative for color change.  Neurological:  Negative for dizziness and weakness.  All other systems reviewed and are negative.  Physical Exam Updated Vital Signs BP 100/65   Pulse 63   Temp 97.9 F (36.6 C) (Oral)   Resp (!) 21   Ht 5\' 9"  (1.753 m)   Wt 122.9 kg   SpO2 94%   BMI 40.02 kg/m   Physical Exam Vitals and nursing note reviewed.  Constitutional:      General: He is not in acute distress.    Appearance: Normal appearance. He is not ill-appearing.  HENT:     Head: Normocephalic and atraumatic.  Eyes:     General: No scleral icterus.    Conjunctiva/sclera: Conjunctivae normal.  Pulmonary:     Effort: Pulmonary effort is normal. No respiratory distress.  Musculoskeletal:        General: Tenderness (Tenderness to right lower extremity) and signs of injury present.     Right lower leg: Edema present.     Left lower leg: Edema present.  Skin:    General: Skin is warm.     Findings: Erythema and lesion present. No rash.     Comments: See  media for image of wound.  Neurological:     Mental Status: He is alert.     Motor: No weakness.  Psychiatric:        Mood and Affect: Mood normal.        Behavior: Behavior normal.    ED Results / Procedures / Treatments   Labs (all labs ordered are listed, but only abnormal results are displayed) Labs Reviewed  CBC WITH DIFFERENTIAL/PLATELET - Abnormal; Notable for the following components:      Result Value   WBC 11.0 (*)    RBC  4.00 (*)    MCV 105.8 (*)    MCH 34.3 (*)    Monocytes Absolute 1.2 (*)    Basophils Absolute 0.2 (*)    Abs Immature Granulocytes 0.34 (*)    All other components within normal limits  BASIC METABOLIC PANEL    EKG None  Radiology No results found.  Procedures Procedures   Medications Ordered in ED Medications - No data to display  ED Course  I have reviewed the triage vital signs and the nursing notes.  Pertinent labs & imaging results that were available during my care of the patient were reviewed by me and considered in my medical decision making (see chart for details).    MDM Rules/Calculators/A&P [facility] is a 80 y.o. male with a past medical history of chronic venous stasis presenting today with a complaint of a poorly healing laceration to his right leg. On 9/19 patient was evaluated in the emergency department after a fall onto his wheelchair.  He had a large laceration to his right lower extremity that was repaired with 16 sutures.  Patient was to have the sutures removed in 7 to 10 days however he lives at a facility and they did not remove the sutures because they felt as though the wound was not fully closed.  Today he is presenting from his facility with a concern for infection.  Patient denies fever, chills or increased numbness and tingling in his feet.  Reports that his leg swelling is at baseline.  Patient was evaluated by me and Dr. Rhunette Croft at bedside. Patient was in no acute distress. Wound was undressed revealing a  poorly healing laceration. I spoke with Dr. Dallas Schimke with orthopedics who recommended that I remove patient's sutures, irrigate and debride the wound is much as possible and discharge the patient on antibiotics.  He may follow-up with his orthopedic office during this upcoming week.  This was explained to the patient and he verbalized understanding. Patient tolerated these procedures well.  16 sutures were removed.  Will be discharged back to [facility] on doxycycline, and his facility will continue to apply clean dressings with bacitracin.  Final Clinical Impression(s) / ED Diagnoses Final diagnoses:  Dehiscence of laceration wound, initial encounter    Rx / DC Orders Results and diagnoses were explained to the patient. Return precautions discussed in full. Patient had no additional questions and expressed complete understanding.     Woodroe Chen 12/19/20 1823    Derwood Kaplan, MD 12/21/20 1122

## 2020-12-23 ENCOUNTER — Telehealth: Payer: Self-pay | Admitting: Orthopedic Surgery

## 2020-12-23 NOTE — Telephone Encounter (Signed)
Patient is at [facility] facility [phone] / fax# [phone] - called + sent fax (unable to leave phone msg)

## 2020-12-23 NOTE — Telephone Encounter (Signed)
Dr. Dallas Schimke sent a staff message to add the patient to the schedule on Wednesday 12/24/20   Staff Message   Patient was seen in the ED for a laceration on his leg that is healing slowly.  Please have him come to clinic Wednesday.   Ok to [employer] at a time that convenient for him   Loraine Leriche   I tried to call the patient to schedule appt and the phone just rang and rang, no voicemail picked up.   I called Cliffard Hair  (son) at [phone].  Left message for him to call us.

## 2020-12-23 NOTE — Telephone Encounter (Signed)
Appointment has been scheduled accordingly per facility, [place], [place].

## 2020-12-24 ENCOUNTER — Other Ambulatory Visit: Payer: Self-pay

## 2020-12-24 ENCOUNTER — Ambulatory Visit (INDEPENDENT_AMBULATORY_CARE_PROVIDER_SITE_OTHER): Payer: Medicare Other | Admitting: Orthopedic Surgery

## 2020-12-24 ENCOUNTER — Encounter: Payer: Self-pay | Admitting: Orthopedic Surgery

## 2020-12-24 VITALS — BP 98/61 | HR 78 | Ht 69.0 in | Wt 270.0 lb

## 2020-12-24 DIAGNOSIS — S81811A Laceration without foreign body, right lower leg, initial encounter: Secondary | ICD-10-CM

## 2020-12-24 NOTE — Patient Instructions (Signed)
Wet to dry dressing twice daily  Continue antibiotics  Will refer to wound care specialist  F/u 1 week

## 2020-12-24 NOTE — Progress Notes (Signed)
New Patient Visit  Assessment: Vernon Campbell is a 80 y.o. male with the following: 1. Leg laceration, right, initial encounter  Plan: Dehisced laceration was evaluated in clinic today.  He has fibrinous tissue at the base, small eschar on the most posterior aspect of the wound.  He has obvious peripheral artery disease, and this will likely be slow to heal.  At this point, I recommended wet-to-dry dressings, to be changed twice daily.  I demonstrated this in clinic today, and provided the patient with written instructions to take back to his skilled nursing facility.  We will plan to see him back in 1 week for repeat evaluation.  In addition, we have placed a referral for occupational therapy to continue with wound care.  Assuming that the wound appears healthy at the next visit, he will be able to continue with follow-up for his wound care with OT.  If this does not heal, or continues to progress, we may have to consider a referral to Dr. Lajoyce Corners for ongoing wound care, and consideration for surgery as needed.   Follow-up: Return in about 1 week (around 12/31/2020).  Subjective:  Chief Complaint  Patient presents with   Leg Injury    Follow up laceration right lower leg// DOI 12/08/20    History of Present Illness: Vernon Campbell is a 79 y.o. male who presents for evaluation of a right leg laceration. Patient sustained a laceration to his right lower leg approximately 2-3 weeks ago.  This was evaluated in the emergency department, and ultimately sutured.  Unfortunately, the wound went on to dehisce, and is now poorly healing.  He did not have the sutures removed, and return to the emergency department to have the sutures removed.  He presents today for follow-up regarding this wound.  It is painful to the patient, does limit his ability to ambulate.  He denies fevers or chills.  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  No family history on file. Social History   Tobacco Use   Smoking status:  Former    Types: Cigarettes   Smokeless tobacco: Never  Vaping Use   Vaping Use: Never used  Substance Use Topics   Alcohol use: Not Currently   Drug use: Not Currently    No Known Allergies  Current Meds  Medication Sig   atorvastatin (LIPITOR) 20 MG tablet Take 20 mg by mouth daily.   bacitracin ointment Apply 1 application topically 2 (two) times daily.   cephALEXin (KEFLEX) 500 MG capsule Take 1 capsule (500 mg total) by mouth 3 (three) times daily.   cholecalciferol (VITAMIN D3) 25 MCG (1000 UNIT) tablet Take 1,000 Units by mouth daily.   doxycycline (VIBRAMYCIN) 100 MG capsule Take 1 capsule  (100 mg total) by mouth 2 (two) times daily.   ergocalciferol (VITAMIN D2) 1.25 MG (50000 UT) capsule Take 50,000 Units by mouth once a week.   ketoconazole (NIZORAL) 2 % shampoo Apply 1 application topically 2 (two) times a week. Shampoo on Tuesday. Bathe on Friday.   lactulose (CHRONULAC) 10 GM/15ML solution Take 10 g by mouth daily as needed for mild constipation.   levothyroxine (SYNTHROID) 100 MCG tablet Take 100 mcg by mouth daily before breakfast.   polyethylene glycol (MIRALAX / GLYCOLAX) 17 g packet Take 17 g by mouth daily.   potassium chloride SA (KLOR-CON) 20 MEQ tablet Take 20 mEq by mouth 2 (two) times daily.   pramipexole (MIRAPEX) 1 MG tablet Take 1 mg by mouth at bedtime.   sennosides-docusate sodium (SENOKOT-S) 8.6-50 MG tablet Take 2 tablets by mouth at bedtime.   spironolactone (ALDACTONE) 25 MG tablet Take 25 mg by mouth daily.   torsemide (DEMADEX) 20 MG tablet Take 3 tablets (60 mg total) by mouth daily.    Objective: BP 98/61   Pulse 78   Ht 5\' 9"  (1.753 m)   Wt 270 lb (122.5 kg)   BMI 39.87 kg/m   Physical Exam:  General: Alert and oriented., No acute distress., and Seated in a wheelchair. Gait: Unable to ambulate.  Evaluation of bilateral lower extremities demonstrates sequelae of bilateral venous insufficiency.  He has dry skin over bilateral lower extremities.  Bilateral lower extremities are discolored.  Over the distal, lateral aspect of his lower leg, there is a large dehisced laceration which extends from posterior to anterior.  Small amount of fresh bleeding is appreciated.  Fibrinous tissue at the base.  There is a small eschar in the posterior aspect of this laceration.  He has decreased sensation in his lower extremity.  Minimal tenderness to palpation around the laceration.  IMAGING: No new imaging obtained today    Laceration - 12/08/20     Laceration 12/19/20     New Medications:  No orders of the defined types were placed in this  encounter.     12/21/20, MD  12/24/2020 4:31 PM

## 2020-12-31 ENCOUNTER — Ambulatory Visit (INDEPENDENT_AMBULATORY_CARE_PROVIDER_SITE_OTHER): Payer: Medicare Other | Admitting: Orthopedic Surgery

## 2020-12-31 ENCOUNTER — Encounter: Payer: Self-pay | Admitting: Orthopedic Surgery

## 2020-12-31 ENCOUNTER — Other Ambulatory Visit: Payer: Self-pay

## 2020-12-31 VITALS — Ht 69.0 in | Wt 270.0 lb

## 2020-12-31 DIAGNOSIS — S81811D Laceration without foreign body, right lower leg, subsequent encounter: Secondary | ICD-10-CM | POA: Diagnosis not present

## 2020-12-31 NOTE — Patient Instructions (Signed)
Wet to dry dressings, to be changed twice daily

## 2020-12-31 NOTE — Progress Notes (Signed)
New Patient Visit  Assessment: Vernon Campbell is a 80 y.o. male with the following: 1. Leg laceration, right; slowly healing  Plan: Patient returns to clinic today, 1 week since last visit.  There has been some improvements in the appearance of the wound.  Health remained stable.  No fevers or chills.  He is comfortable with the current regimen of wound care.  We will continue with wet-to-dry dressings for now.  However, I will discuss this with Dr. Lajoyce Corners and get his recommendations.  We could potentially refer him to Dr. Lajoyce Corners for ongoing management of this slowly healing wound.   Follow-up: Return in about 2 weeks (around 01/14/2021).  Subjective:  Chief Complaint  Patient presents with   Wound Check    Rt lower leg DOI 12/08/20    History of Present Illness: Vernon Campbell is a 80 y.o. male who presents for evaluation of a right leg laceration.  He was last seen in clinic approximately 1 week ago.  Since then, he has been doing wet-to-dry dressings once a day.  He feels it is improving.  We did try and refer her into occupational therapy for ongoing wound care, but they have been unable to schedule these appointments.  He denies fevers and chills.  He denies ongoing pain.  He is tolerating the dressing changes without issue.   Review of Systems: No fevers or chills No numbness or tingling No chest pain No shortness of breath No bowel or bladder dysfunction No GI distress No headaches    Objective: Ht 5\' 9"  (1.753 m)   Wt 270 lb (122.5 kg)   BMI 39.87 kg/m   Physical Exam:  General: Alert and oriented., No acute distress., and Seated in a wheelchair. Gait: Unable to ambulate.  Laceration on right lower leg has improved slightly, compared to previous.  Overall dimensions are slightly smaller.  Fibrinous tissue remains at the base of the wound.  No change in the overall appearance of bilateral lower legs.       IMAGING: No new imaging obtained today    ,  MD  12/31/2020 11:20 PM

## 2021-01-14 ENCOUNTER — Ambulatory Visit (INDEPENDENT_AMBULATORY_CARE_PROVIDER_SITE_OTHER): Payer: Medicare Other | Admitting: Orthopedic Surgery

## 2021-01-14 ENCOUNTER — Other Ambulatory Visit: Payer: Self-pay

## 2021-01-14 ENCOUNTER — Encounter: Payer: Self-pay | Admitting: Orthopedic Surgery

## 2021-01-14 VITALS — Ht 69.0 in | Wt 270.0 lb

## 2021-01-14 DIAGNOSIS — S81811D Laceration without foreign body, right lower leg, subsequent encounter: Secondary | ICD-10-CM

## 2021-01-14 NOTE — Progress Notes (Signed)
New Patient Visit  Assessment: Vernon Campbell is a 80 y.o. male with the following: 1. Leg laceration, right; slowly healing  Plan: Patient remained stable.  He has continued with wet-to-dry dressings for the past 2 weeks.  Wound continues to heal very slowly.  We have had difficulty getting in touch with the patient, regarding referral to Dr. Lajoyce Corners.  This was discussed in clinic today, and he is in agreement.  We will place an urgent referral for continued wound care with Dr. Lajoyce Corners.  Until he is able to see Dr. Lajoyce Corners, he should continue with wet-to-dry dressings twice daily follow-up as needed.   Follow-up: Return if symptoms worsen or fail to improve.  Subjective:  Chief Complaint  Patient presents with   Leg Injury    R/ no pain, just taking awhile for it to heal. The girls where I stay have been doing a good job.    History of Present Illness: Vernon Campbell is a 80 y.o. male who presents for evaluation of a right leg laceration.  He was last seen in clinic 2 weeks ago.  He has continued with wet-to-dry dressings.  He denies pain.  He has no fevers or chills.  Since last visit, we have attempted to contact him in regards to placing a referral for ongoing treatment with Dr. Lajoyce Corners.  However, we have been unable to discuss this with the patient.  He is pleased with gradual improvements he is noticing, but notes that it is a slow overall process.  Review of Systems: No fevers or chills No numbness or tingling No chest pain No shortness of breath No bowel or bladder dysfunction No GI distress No headaches    Objective: Ht 5\' 9"  (1.753 m)   Wt 270 lb (122.5 kg)   BMI 39.87 kg/m   Physical Exam:  General: Alert and oriented., No acute distress., and Seated in a wheelchair. Gait: Unable to ambulate.  Fibrinous tissue remains at the base of the wound.  No change in the overall appearance of bilateral lower legs.          IMAGING: No new imaging obtained today    , MD  01/14/2021 12:08 PM

## 2021-01-14 NOTE — Patient Instructions (Signed)
Urgent referral to Dr. Lajoyce Corners for continue wound care.  Dr. Lajoyce Corners has previously accepted the referral, but we have been unable to schedule an appointment.  Until scheduled appointment with Dr. Lajoyce Corners, recommend wet-to-dry dressings, to be changed twice daily.

## 2021-01-20 ENCOUNTER — Ambulatory Visit: Payer: Medicare Other | Admitting: Orthopedic Surgery

## 2021-01-29 ENCOUNTER — Ambulatory Visit (INDEPENDENT_AMBULATORY_CARE_PROVIDER_SITE_OTHER): Payer: Medicare Other | Admitting: Orthopedic Surgery

## 2021-01-29 ENCOUNTER — Other Ambulatory Visit: Payer: Self-pay

## 2021-01-29 DIAGNOSIS — S81811A Laceration without foreign body, right lower leg, initial encounter: Secondary | ICD-10-CM

## 2021-02-05 ENCOUNTER — Ambulatory Visit (INDEPENDENT_AMBULATORY_CARE_PROVIDER_SITE_OTHER): Payer: Medicare Other | Admitting: Orthopedic Surgery

## 2021-02-05 DIAGNOSIS — S81811D Laceration without foreign body, right lower leg, subsequent encounter: Secondary | ICD-10-CM

## 2021-02-06 ENCOUNTER — Encounter: Payer: Self-pay | Admitting: Orthopedic Surgery

## 2021-02-06 NOTE — Progress Notes (Signed)
Office Visit Note   Patient: Vernon Campbell           Date of Birth: 05-28-1940           MRN: [id number] Visit Date: 02/05/2021              Requested by: No referring provider defined for this encounter. PCP: Pcp, No  Chief Complaint  Patient presents with   Right Leg - Follow-up      HPI: Patient is an 80 year old gentleman who is seen in follow-up for traumatic laceration right leg.  Patient was placed in a compression wrap last week for both legs.  Assessment & Plan: Visit Diagnoses:  1. Leg laceration, right, subsequent encounter     Plan: Patient will advance to an extra-large compression sock recommend that he wear this around-the-clock.  Change this daily.  Follow-Up Instructions: Return if symptoms worsen or fail to improve.   Ortho Exam  Patient is alert, oriented, no adenopathy, well-dressed, normal affect, normal respiratory effort. Examination patient shows almost complete healing of the right lateral calf ulcer.  His both calves have shown significant decreased edema with the compression wraps.  Both calves measure 41 cm in circumference at this time.  Imaging: No results found. No images are attached to the encounter.  Labs: No results found for: HGBA1C, ESRSEDRATE, CRP, LABURIC, REPTSTATUS, GRAMSTAIN, CULT, LABORGA   Lab Results  Component Value Date   ALBUMIN 4.0 10/02/2020   ALBUMIN 4.1 10/01/2020    Lab Results  Component Value Date   MG 2.2 10/02/2020   No results found for: VD25OH  No results found for: PREALBUMIN CBC EXTENDED Latest Ref Rng & Units 12/19/2020 10/02/2020 10/01/2020  WBC 4.0 - 10.5 K/uL 11.0(H) 7.8 8.7  RBC 4.22 - 5.81 MIL/uL 4.00(L) 4.80 4.75  HGB 13.0 - 17.0 g/dL 76.7 20.9 47.0  HCT 96.2 - 52.0 % 42.3 53.3(H) 52.8(H)  PLT 150 - 400 K/uL 254 163 162  NEUTROABS 1.7 - 7.7 K/uL 6.7 - 5.6  LYMPHSABS 0.7 - 4.0 K/uL 2.2 - 1.6     There is no height or weight on file to calculate BMI.  Orders:  No orders of the defined  types were placed in this encounter.  No orders of the defined types were placed in this encounter.    Procedures: No procedures performed  Clinical Data: No additional findings.  ROS:  All other systems negative, except as noted in the HPI. Review of Systems  Objective: Vital Signs: There were no vitals taken for this visit.  Specialty Comments:  No specialty comments available.  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)    Age-related physical debility    Carpal tunnel syndrome, right    Chronic cor pulmonale (HCC)    Chronic diastolic heart failure (HCC)    Chronic respiratory failure with hypoxia (HCC)    Combined forms  of age-related cataract, bilateral    Constipation, chronic    Dermatochalasis of eyelid    Extreme obesity with alveolar hypoventilation (HCC)    History of tobacco use    Hyperlipidemia    Hypermetropia, bilateral    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 No pertinent family history.  History reviewed. No pertinent surgical history. Social History   Occupational History   Not on file  Tobacco Use   Smoking status: Former    Types: Cigarettes   Smokeless tobacco: Never  Vaping Use   Vaping Use: Never used  Substance and Sexual Activity   Alcohol use: Not Currently   Drug use: Not Currently   Sexual activity: Not Currently

## 2021-02-06 NOTE — Progress Notes (Signed)
Office Visit Note   Patient: Vernon Campbell           Date of Birth: 1940/08/29           MRN: [id number] Visit Date: 01/29/2021              Requested by: Oliver Barre, MD [phone] [address] [place],  [place] [address] PCP: Pcp, No  Chief Complaint  Patient presents with   Right Leg - Open Wound      HPI: Patient is an 80 year old gentleman who is seen for initial evaluation for laceration right lower extremity.  Patient has been undergoing wet-to-dry dressing changes twice a day.  Patient has been provided antibiotics including doxycycline and Keflex.  Patient's initial injury occurred from a laceration from a walker after a fall.  Patient initially had some sutures placed but patient states that these did pull out.  Assessment & Plan: Visit Diagnoses:  1. Leg laceration, right, initial encounter     Plan: Patient is placed in a Dynaflex compression wrap for the venous swelling of both lower extremities follow-up in 1 week.  Patient will need an extra-large compression sock.  Patient states he has tried compression socks in the past but had difficulty getting them on.  Follow-Up Instructions: Return in about 1 week (around 02/05/2021).   Ortho Exam  Patient is alert, oriented, no adenopathy, well-dressed, normal affect, normal respiratory effort. Examination patient has venous swelling of both lower extremities.  The right calf is 47 cm in circumference left calf is 45 cm in circumference.  There is a 2 x 10 cm laceration on the right calf.  There is healthy granulation tissue.  No cellulitis no odor no drainage.  Imaging: No results found. No images are attached to the encounter.  Labs: No results found for: HGBA1C, ESRSEDRATE, CRP, LABURIC, REPTSTATUS, GRAMSTAIN, CULT, LABORGA   Lab Results  Component Value Date   ALBUMIN 4.0 10/02/2020   ALBUMIN 4.1 10/01/2020    Lab Results  Component Value Date   MG 2.2 10/02/2020   No results found for: VD25OH  No results found  for: PREALBUMIN CBC EXTENDED Latest Ref Rng & Units 12/19/2020 10/02/2020 10/01/2020  WBC 4.0 - 10.5 K/uL 11.0(H) 7.8 8.7  RBC 4.22 - 5.81 MIL/uL 4.00(L) 4.80 4.75  HGB 13.0 - 17.0 g/dL 22.2 97.9 89.2  HCT 11.9 - 52.0 % 42.3 53.3(H) 52.8(H)  PLT 150 - 400 K/uL 254 163 162  NEUTROABS 1.7 - 7.7 K/uL 6.7 - 5.6  LYMPHSABS 0.7 - 4.0 K/uL 2.2 - 1.6     There is no height or weight on file to calculate BMI.  Orders:  No orders of the defined types were placed in this encounter.  No orders of the defined types were placed in this encounter.    Procedures: No procedures performed  Clinical Data: No additional findings.  ROS:  All other systems negative, except as noted in the HPI. Review of Systems  Objective: Vital Signs: There were no vitals taken for this visit.  Specialty Comments:  No specialty comments available.  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)    Age-related physical debility    Carpal tunnel syndrome, right    Chronic cor pulmonale (HCC)    Chronic diastolic heart failure (HCC)    Chronic respiratory failure with hypoxia (HCC)    Combined forms of age-related cataract, bilateral    Constipation, chronic    Dermatochalasis of eyelid    Extreme obesity with alveolar hypoventilation (HCC)    History of tobacco use    Hyperlipidemia    Hypermetropia, bilateral     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 No pertinent family history.  History reviewed. No pertinent surgical history. Social History   Occupational History   Not on file  Tobacco Use   Smoking status: Former    Types: Cigarettes   Smokeless tobacco: Never  Vaping Use   Vaping Use: Never used  Substance and Sexual Activity   Alcohol use: Not Currently   Drug use: Not Currently   Sexual activity: Not Currently

## 2021-02-09 ENCOUNTER — Telehealth: Payer: Self-pay | Admitting: Orthopedic Surgery

## 2021-02-09 NOTE — Telephone Encounter (Signed)
Kayla Hill from [facility] called. She would like to know if regular ted hose can be used do to his insurance not covering the stockings prescribed? Her call back number is  [phone]

## 2021-02-09 NOTE — Telephone Encounter (Signed)
11/10 & 11/17 ov notes faxed to [facility] [phone] Lockie Pares

## 2021-02-09 NOTE — Telephone Encounter (Signed)
Called and lm on vm to advise that the compression sock ordered is for wound care as well as compression and the ted hose alone would not be able to do this. He should apply sock with direct contact to the wound and if he should have any drainage that comes though the sock then to apply a dry dressing to the outside of the sock. This will give the compression therapy that he needs as well as the wound care being that the sock is impregnated with silver and copper which is great in the treatment of wounds. . To call with any questions.

## 2021-08-18 ENCOUNTER — Encounter (HOSPITAL_COMMUNITY): Payer: Self-pay

## 2021-08-18 ENCOUNTER — Inpatient Hospital Stay (HOSPITAL_COMMUNITY)
Admission: EM | Admit: 2021-08-18 | Discharge: 2021-08-21 | DRG: 291 | Disposition: A | Discharge status: Skilled Nursing Facility | Payer: Medicare Other | Source: Skilled Nursing Facility | Attending: Internal Medicine | Admitting: Internal Medicine

## 2021-08-18 ENCOUNTER — Other Ambulatory Visit: Payer: Self-pay

## 2021-08-18 ENCOUNTER — Emergency Department (HOSPITAL_COMMUNITY): Payer: Medicare Other

## 2021-08-18 DIAGNOSIS — E785 Hyperlipidemia, unspecified: Secondary | ICD-10-CM | POA: Diagnosis present

## 2021-08-18 DIAGNOSIS — E876 Hypokalemia: Secondary | ICD-10-CM

## 2021-08-18 DIAGNOSIS — I480 Paroxysmal atrial fibrillation: Secondary | ICD-10-CM | POA: Diagnosis present

## 2021-08-18 DIAGNOSIS — Z6841 Body Mass Index (BMI) 40.0 and over, adult: Secondary | ICD-10-CM | POA: Diagnosis not present

## 2021-08-18 DIAGNOSIS — I110 Hypertensive heart disease with heart failure: Principal | ICD-10-CM | POA: Diagnosis present

## 2021-08-18 DIAGNOSIS — I272 Pulmonary hypertension, unspecified: Secondary | ICD-10-CM | POA: Diagnosis present

## 2021-08-18 DIAGNOSIS — Z87891 Personal history of nicotine dependence: Secondary | ICD-10-CM | POA: Diagnosis not present

## 2021-08-18 DIAGNOSIS — J9611 Chronic respiratory failure with hypoxia: Secondary | ICD-10-CM | POA: Diagnosis present

## 2021-08-18 DIAGNOSIS — Z66 Do not resuscitate: Secondary | ICD-10-CM | POA: Diagnosis present

## 2021-08-18 DIAGNOSIS — I5033 Acute on chronic diastolic (congestive) heart failure: Secondary | ICD-10-CM | POA: Diagnosis present

## 2021-08-18 DIAGNOSIS — R601 Generalized edema: Secondary | ICD-10-CM | POA: Diagnosis not present

## 2021-08-18 DIAGNOSIS — E782 Mixed hyperlipidemia: Secondary | ICD-10-CM

## 2021-08-18 DIAGNOSIS — G2581 Restless legs syndrome: Secondary | ICD-10-CM | POA: Diagnosis present

## 2021-08-18 DIAGNOSIS — E039 Hypothyroidism, unspecified: Secondary | ICD-10-CM | POA: Diagnosis present

## 2021-08-18 DIAGNOSIS — Z79899 Other long term (current) drug therapy: Secondary | ICD-10-CM | POA: Diagnosis not present

## 2021-08-18 DIAGNOSIS — Z9981 Dependence on supplemental oxygen: Secondary | ICD-10-CM

## 2021-08-18 DIAGNOSIS — Z79890 Hormone replacement therapy (postmenopausal): Secondary | ICD-10-CM

## 2021-08-18 DIAGNOSIS — I509 Heart failure, unspecified: Principal | ICD-10-CM

## 2021-08-18 DIAGNOSIS — E66813 Obesity, class 3: Secondary | ICD-10-CM | POA: Diagnosis present

## 2021-08-18 LAB — CBC
HCT: 52.2 % — ABNORMAL HIGH (ref 39.0–52.0)
HCT: 52.7 % — ABNORMAL HIGH (ref 39.0–52.0)
Hemoglobin: 15.1 g/dL (ref 13.0–17.0)
Hemoglobin: 15.5 g/dL (ref 13.0–17.0)
MCH: 31.3 pg (ref 26.0–34.0)
MCH: 31.4 pg (ref 26.0–34.0)
MCHC: 28.9 g/dL — ABNORMAL LOW (ref 30.0–36.0)
MCHC: 29.4 g/dL — ABNORMAL LOW (ref 30.0–36.0)
MCV: 108.1 fL — ABNORMAL HIGH (ref 80.0–100.0)
MCV: 106.9 fL — ABNORMAL HIGH (ref 80.0–100.0)
Platelets: 166 10*3/uL (ref 150–400)
Platelets: 167 10*3/uL (ref 150–400)
RBC: 4.83 MIL/uL (ref 4.22–5.81)
RBC: 4.93 MIL/uL (ref 4.22–5.81)
RDW: 14.8 % (ref 11.5–15.5)
RDW: 14.9 % (ref 11.5–15.5)
WBC: 9 10*3/uL (ref 4.0–10.5)
WBC: 7.9 10*3/uL (ref 4.0–10.5)
nRBC: 0 % (ref 0.0–0.2)
nRBC: 0 % (ref 0.0–0.2)

## 2021-08-18 LAB — BASIC METABOLIC PANEL
Anion gap: 6 (ref 5–15)
BUN: 22 mg/dL (ref 8–23)
CO2: 43 mmol/L — ABNORMAL HIGH (ref 22–32)
Calcium: 8.9 mg/dL (ref 8.9–10.3)
Chloride: 93 mmol/L — ABNORMAL LOW (ref 98–111)
Creatinine, Ser: 0.98 mg/dL (ref 0.61–1.24)
GFR, Estimated: >60 mL/min (ref >60)
Glucose, Bld: 78 mg/dL (ref 70–99)
Potassium: 4.7 mmol/L (ref 3.5–5.1)
Sodium: 142 mmol/L (ref 135–145)

## 2021-08-18 LAB — PHOSPHORUS: Phosphorus: 4 mg/dL (ref 2.5–4.6)

## 2021-08-18 LAB — CREATININE, SERUM
Creatinine, Ser: 0.94 mg/dL (ref 0.61–1.24)
GFR, Estimated: >60 mL/min (ref >60)

## 2021-08-18 LAB — TROPONIN I (HIGH SENSITIVITY): Troponin I (High Sensitivity): 17 ng/L (ref <18)

## 2021-08-18 LAB — TSH: TSH: 1.108 u[IU]/mL (ref 0.350–4.500)

## 2021-08-18 LAB — BRAIN NATRIURETIC PEPTIDE: B Natriuretic Peptide: 44 pg/mL (ref 0.0–100.0)

## 2021-08-18 LAB — MAGNESIUM
Magnesium: 1.9 mg/dL (ref 1.7–2.4)
Magnesium: 1.9 mg/dL (ref 1.7–2.4)

## 2021-08-18 MED ORDER — ONDANSETRON HCL 4 MG/2ML IJ SOLN: 4.0000 mg | Freq: Four times a day (QID) | INTRAMUSCULAR | Status: DC | PRN

## 2021-08-18 MED ORDER — PRAMIPEXOLE DIHYDROCHLORIDE 1 MG PO TABS
1.0000 mg | ORAL_TABLET | Freq: Every day | ORAL | Status: DC
  Administered 2021-08-18 – 2021-08-20 (×3): 1 mg via ORAL
  Filled 2021-08-18 (×3): qty 1

## 2021-08-18 MED ORDER — SODIUM CHLORIDE 0.9% FLUSH
3.0000 mL | Freq: Two times a day (BID) | INTRAVENOUS | Status: DC
  Administered 2021-08-18 – 2021-08-20 (×5): 3 mL via INTRAVENOUS

## 2021-08-18 MED ORDER — POTASSIUM CHLORIDE CRYS ER 20 MEQ PO TBCR
20.0000 meq | EXTENDED_RELEASE_TABLET | Freq: Once | ORAL | Status: AC
  Administered 2021-08-18: 20 meq via ORAL
  Filled 2021-08-18: qty 1

## 2021-08-18 MED ORDER — ATORVASTATIN CALCIUM 10 MG PO TABS
20.0000 mg | ORAL_TABLET | Freq: Every day | ORAL | Status: DC
  Administered 2021-08-18 – 2021-08-21 (×4): 20 mg via ORAL
  Filled 2021-08-18 (×4): qty 2

## 2021-08-18 MED ORDER — SODIUM CHLORIDE 0.9% FLUSH: 3.0000 mL | INTRAVENOUS | Status: DC | PRN

## 2021-08-18 MED ORDER — ALBUTEROL SULFATE HFA 108 (90 BASE) MCG/ACT IN AERS
2.0000 | INHALATION_SPRAY | RESPIRATORY_TRACT | Status: DC | PRN
Start: 2021-08-18 — End: 2021-08-21

## 2021-08-18 MED ORDER — LEVOTHYROXINE SODIUM 100 MCG PO TABS
100.0000 ug | ORAL_TABLET | Freq: Every day | ORAL | Status: DC
  Administered 2021-08-19 – 2021-08-21 (×3): 100 ug via ORAL
  Filled 2021-08-18 (×3): qty 1

## 2021-08-18 MED ORDER — SODIUM CHLORIDE 0.9 % IV SOLN: 250.0000 mL | INTRAVENOUS | Status: DC | PRN

## 2021-08-18 MED ORDER — ACETAMINOPHEN 650 MG RE SUPP: 650.0000 mg | Freq: Four times a day (QID) | RECTAL | Status: DC | PRN

## 2021-08-18 MED ORDER — ACETAMINOPHEN 325 MG PO TABS
650.0000 mg | ORAL_TABLET | Freq: Four times a day (QID) | ORAL | Status: DC | PRN
  Administered 2021-08-19 – 2021-08-20 (×3): 650 mg via ORAL
  Filled 2021-08-18 (×3): qty 2

## 2021-08-18 MED ORDER — FUROSEMIDE 10 MG/ML IJ SOLN
100.0000 mg | Freq: Once | INTRAVENOUS | Status: AC
  Administered 2021-08-18: 100 mg via INTRAVENOUS
  Filled 2021-08-18: qty 10

## 2021-08-18 MED ORDER — ENOXAPARIN SODIUM 40 MG/0.4ML IJ SOSY
40.0000 mg | PREFILLED_SYRINGE | INTRAMUSCULAR | Status: DC
Start: 2021-08-18 — End: 2021-08-21
  Administered 2021-08-18 – 2021-08-20 (×3): 40 mg via SUBCUTANEOUS
  Filled 2021-08-18 (×3): qty 0.4

## 2021-08-18 MED ORDER — POTASSIUM CHLORIDE CRYS ER 20 MEQ PO TBCR
20.0000 meq | EXTENDED_RELEASE_TABLET | Freq: Every day | ORAL | Status: DC
  Administered 2021-08-19 – 2021-08-21 (×3): 20 meq via ORAL
  Filled 2021-08-18 (×3): qty 1

## 2021-08-18 MED ORDER — SENNOSIDES-DOCUSATE SODIUM 8.6-50 MG PO TABS
2.0000 | ORAL_TABLET | Freq: Every day | ORAL | Status: DC
Start: 2021-08-18 — End: 2021-08-21
  Administered 2021-08-18 – 2021-08-20 (×2): 2 via ORAL
  Filled 2021-08-18 (×3): qty 2

## 2021-08-18 MED ORDER — VITAMIN D 25 MCG (1000 UNIT) PO TABS
1000.0000 [IU] | ORAL_TABLET | Freq: Every day | ORAL | Status: DC
  Administered 2021-08-18 – 2021-08-21 (×4): 1000 [IU] via ORAL
  Filled 2021-08-18 (×4): qty 1

## 2021-08-18 MED ORDER — ONDANSETRON HCL 4 MG PO TABS: 4.0000 mg | ORAL_TABLET | Freq: Four times a day (QID) | ORAL | Status: DC | PRN

## 2021-08-18 MED ORDER — METOLAZONE 5 MG PO TABS
2.5000 mg | ORAL_TABLET | Freq: Two times a day (BID) | ORAL | Status: DC
Start: 2021-08-18 — End: 2021-08-19
  Administered 2021-08-18 – 2021-08-19 (×3): 2.5 mg via ORAL
  Filled 2021-08-18 (×3): qty 1

## 2021-08-18 MED ORDER — FUROSEMIDE 10 MG/ML IJ SOLN
80.0000 mg | Freq: Two times a day (BID) | INTRAMUSCULAR | Status: DC
  Administered 2021-08-18 – 2021-08-20 (×5): 80 mg via INTRAVENOUS
  Filled 2021-08-18 (×5): qty 8

## 2021-08-18 NOTE — Assessment & Plan Note (Signed)
-  Continue Lipitor °

## 2021-08-18 NOTE — Assessment & Plan Note (Signed)
-  Body mass index is 44.29 kg/m. -Low calorie diet, portion control and increase physical activity discussed with patient.

## 2021-08-18 NOTE — ED Provider Notes (Signed)
[facility] Provider Note   CSN: [id number] Arrival date & time: 08/18/21  1142     History  Chief Complaint  Patient presents with   Shortness of Breath    Vernon Campbell is a 81 y.o. male.   Shortness of Breath  This patient is an 81 year old male, he describes a history of having congestive heart failure on oxygen, 3 L constantly.  He has hypothyroidism, states that he is currently taking torsemide 3 times daily, he is also on spironolactone.  The patient is followed by his family doctor, he has recently been seen by orthopedics because of leg laceration that was having difficulty healing, he reports that he has never had a heart attack however over the last week he has had progressive shortness of breath, progressive swelling and edema and now is having difficulty getting comfortable because he cannot breathe when he lays down.  He has been taking his torsemide but having good urinary output though it seems to be decreased from previous.  No fevers or chills, no coughing up any phlegm.  Home Medications Prior to Admission medications   Medication Sig Start Date End Date Taking? Authorizing Provider  atorvastatin (LIPITOR) 20 MG tablet Take 20 mg by mouth daily.    [provider]  bacitracin ointment Apply 1 application topically 2 (two) times daily. 12/08/20   Rancour, Annie Main, MD  cephALEXin (KEFLEX) 500 MG capsule Take 1 capsule (500 mg total) by mouth 3 (three) times daily. 12/08/20   Rancour, Annie Main, MD  cholecalciferol (VITAMIN D3) 25 MCG (1000 UNIT) tablet Take 1,000 Units by mouth daily.    [provider]  doxycycline (VIBRAMYCIN) 100 MG capsule Take 1 capsule (100 mg total) by mouth 2 (two) times daily. 12/19/20   Redwine, Madison A, PA-C  ergocalciferol (VITAMIN D2) 1.25 MG (50000 UT) capsule Take 50,000 Units by mouth once a week.    [provider]  ketoconazole (NIZORAL) 2 % shampoo Apply 1 application topically 2 (two) times  a week. Shampoo on Tuesday. Bathe on Friday.    [provider]  lactulose (CHRONULAC) 10 GM/15ML solution Take 10 g by mouth daily as needed for mild constipation.    [provider]  levothyroxine (SYNTHROID) 100 MCG tablet Take 100 mcg by mouth daily before breakfast.    [provider]  polyethylene glycol (MIRALAX / GLYCOLAX) 17 g packet Take 17 g by mouth daily.    [provider]  potassium chloride SA (KLOR-CON) 20 MEQ tablet Take 20 mEq by mouth 2 (two) times daily.    [provider]  pramipexole (MIRAPEX) 1 MG tablet Take 1 mg by mouth at bedtime.    [provider]  sennosides-docusate sodium (SENOKOT-S) 8.6-50 MG tablet Take 2 tablets by mouth at bedtime.    [provider]  spironolactone (ALDACTONE) 25 MG tablet Take 25 mg by mouth daily.    [provider]  torsemide (DEMADEX) 20 MG tablet Take 3 tablets (60 mg total) by mouth daily. 10/04/20   Murlean Iba, MD      Allergies    Patient has no known allergies.    Review of Systems   Review of Systems  Respiratory:  Positive for shortness of breath.   All other systems reviewed and are negative.  Physical Exam Updated Vital Signs BP 111/75   Pulse 74   Temp 98.2 F (36.8 C) (Oral)   Resp 20   Ht 1.753 m (5\' 9" )  Wt 136 kg   SpO2 95%   BMI 44.29 kg/m  Physical Exam Vitals and nursing note reviewed.  Constitutional:      General: He is not in acute distress.    Appearance: He is well-developed. He is ill-appearing.  HENT:     Head: Normocephalic and atraumatic.     Mouth/Throat:     Pharynx: No oropharyngeal exudate.  Eyes:     General: No scleral icterus.       Right eye: No discharge.        Left eye: No discharge.     Conjunctiva/sclera: Conjunctivae normal.     Pupils: Pupils are equal, round, and reactive to light.  Neck:     Thyroid: No thyromegaly.     Vascular: No JVD.  Cardiovascular:     Rate and Rhythm: Normal rate  and regular rhythm.     Heart sounds: Normal heart sounds. No murmur heard.   No friction rub. No gallop.  Pulmonary:     Effort: Pulmonary effort is normal. No respiratory distress.     Breath sounds: Rales present. No wheezing.  Abdominal:     General: Bowel sounds are normal. There is no distension.     Palpations: Abdomen is soft. There is no mass.     Tenderness: There is no abdominal tenderness.  Musculoskeletal:        General: No tenderness. Normal range of motion.     Cervical back: Normal range of motion and neck supple.     Right lower leg: Edema present.     Left lower leg: Edema present.  Lymphadenopathy:     Cervical: No cervical adenopathy.  Skin:    General: Skin is warm and dry.     Findings: No erythema or rash.  Neurological:     Mental Status: He is alert.     Coordination: Coordination normal.  Psychiatric:        Behavior: Behavior normal.    ED Results / Procedures / Treatments   Labs (all labs ordered are listed, but only abnormal results are displayed) Labs Reviewed  BASIC METABOLIC PANEL - Abnormal; Notable for the following components:      Result Value   Chloride 93 (*)    CO2 43 (*)    All other components within normal limits  CBC - Abnormal; Notable for the following components:   HCT 52.2 (*)    MCV 108.1 (*)    MCHC 28.9 (*)    All other components within normal limits  MAGNESIUM  BRAIN NATRIURETIC PEPTIDE  TROPONIN I (HIGH SENSITIVITY)    EKG EKG Interpretation  Date/Time:  Tuesday Aug 18 2021 11:59:18 EDT Ventricular Rate:  66 PR Interval:  140 QRS Duration: 138 QT Interval:  396 QTC Calculation: 415 R Axis:   167 Text Interpretation: Sinus or ectopic atrial rhythm RBBB and LPFB Inferior infarct, old Lateral leads are also involved Confirmed by Noemi Chapel [phone]) on 08/18/2021 12:44:48 PM  Radiology DG Chest Port 1 View  Result Date: 08/18/2021 CLINICAL DATA:  Shortness of breath and LOWER extremity swelling. History of  heart failure. EXAM: PORTABLE CHEST 1 VIEW COMPARISON:  12/08/2020 chest radiograph and prior studies FINDINGS: Cardiomegaly with pulmonary vascular congestion noted. Increasing interstitial opacities identified, likely representing edema. LEFT basilar atelectasis/scarring again noted. No pneumothorax or large pleural effusion identified. IMPRESSION: Cardiomegaly with pulmonary vascular congestion. Increasing interstitial opacities likely representing edema. Electronically Signed   By: Margarette Canada M.D.   On: 08/18/2021  12:47    Procedures Procedures    Medications Ordered in ED Medications  albuterol (VENTOLIN HFA) 108 (90 Base) MCG/ACT inhaler 2 puff (has no administration in time range)  furosemide (LASIX) 100 mg in dextrose 5 % 50 mL IVPB (100 mg Intravenous New Bag/Given 08/18/21 1416)  potassium chloride SA (KLOR-CON M) CR tablet 20 mEq (20 mEq Oral Given 08/18/21 1416)    ED Course/ Medical Decision Making/ A&P                           Medical Decision Making Amount and/or Complexity of Data Reviewed Labs: ordered. Radiology: ordered.  Risk Prescription drug management. Decision regarding hospitalization.   In addition to having rales the patient has what appears to be bilateral edema extending up onto his abdominal wall consistent with anasarca.  I am concerned that the patient is having a congestive heart failure exacerbation and will need significant diuresis.  We will start with intravenous diuresis, chest x-ray confirms pulmonary edema  This patient presents to the ED for concern of edema and shortness of breath, this involves an extensive number of treatment options, and is a complaint that carries with it a high risk of complications and morbidity.  The differential diagnosis includes heart failure liver failure renal failure proteinuria, DVT though that seems less likely   Co morbidities that complicate the patient evaluation  History of congestive heart failure, chronic  edema   Additional history obtained:  Additional history obtained from electronic medical record External records from outside source obtained and reviewed including prior office visits from orthopedics   Lab Tests:  I Ordered, and personally interpreted labs.  The pertinent results include:  no acute findings, magnesium normal, white blood cell count normal, hemoglobin normal platelets normal troponin normal,   Imaging Studies ordered:  I ordered imaging studies including chest x-ray I independently visualized and interpreted imaging which showed pulmonary edema I agree with the radiologist interpretation   Cardiac Monitoring: / EKG:  The patient was maintained on a cardiac monitor.  I personally viewed and interpreted the cardiac monitored which showed an underlying rhythm of: Normal sinus rhythm   Consultations Obtained:  I requested consultation with the hospitalist Dr. Dyann Kief,  and discussed lab and imaging findings as well as pertinent plan - they recommend: Admission to the hospital   Problem List / ED Course / Critical interventions / Medication management  Pulmonary edema and fluid overload I ordered medication including Lasix for diuresis from significant fluid overload Reevaluation of the patient after these medicines showed that the patient improved I have reviewed the patients home medicines and have made adjustments as needed   Social Determinants of Health:  None   Test / Admission - Considered:  We will admit to the hospital         Final Clinical Impression(s) / ED Diagnoses Final diagnoses:  Acute congestive heart failure, unspecified heart failure type [facility])  Anasarca    Rx / DC Orders ED Discharge Orders     None         Noemi Chapel, MD 08/18/21 1511

## 2021-08-18 NOTE — Assessment & Plan Note (Signed)
-   Resumed  Mirapex - norco prn severe pain

## 2021-08-18 NOTE — Assessment & Plan Note (Addendum)
-   Patient will be admitted to telemetry. -Update 2D echo -Follow daily weights, low-sodium diet and strict intake and output -Measure lungs volume percent ([employer]) -Aggressive diuresis with the use of IV Lasix and metolazone -Holding spironolactone acutely and following renal function/electrolytes closely.

## 2021-08-18 NOTE — ED Notes (Signed)
Pharmacy called at this time for update on status of LASIX. Pharmacy stated they would bring it down.

## 2021-08-18 NOTE — H&P (Signed)
History and Physical    Patient: Vernon Campbell [id number] DOB: [birth year] DOA: 08/18/2021 DOS: the patient was seen and examined on 08/18/2021 PCP: System, Provider Not In  Patient coming from: Skilled nursing facility.  Christella Hartigan([facility] [place])  Chief Complaint:  Chief Complaint  Patient presents with   Shortness of Breath   HPI: Vernon Campbell is a 81 y.o. male with medical history significant of chronic respiratory failure with hypoxia using around 3 L nasal cannula supplementation at baseline; hyperlipidemia, hypothyroidism, morbid obesity, chronic diastolic heart failure, restless leg syndrome and more; who presented to the hospital from his nursing home secondary to increased lower extremity swelling, short winded sensation and difficulty laying down flat.  Patient reports symptom has been present for approximately a week and worsening.  Despite adjustment in his oral diuretics regimen as an outpatient he continued to notice an weight gain and more difficulty breathing at bedtime.  Patient reports no fever, no productive cough, no sick contacts, no dysuria, no hematuria, no focal weakness, no chest pain, no hemoptysis or any other complaints.  In the ED BMP was low (probably in the setting of obesity); chest x-ray demonstrating vascular congestion/interstitial edema.  Per records patient approximately 20 pounds above baseline.  IV diuresis initiated and TRH contacted to place patient in the hospital for further evaluation and management.   Review of Systems: As mentioned in the history of present illness. All other systems reviewed and are negative. 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 No pertinent surgical history. Social History:  reports that he has quit smoking. His smoking use included cigarettes. He has never used smokeless tobacco. He reports that he does not currently use alcohol. He reports that he does not currently use drugs.  No Known Allergies  No family history on file.  Prior to Admission medications   Medication Sig Start Date End Date Taking? Authorizing Provider  atorvastatin (LIPITOR) 20 MG tablet Take 20 mg by mouth daily.    [provider]  bacitracin ointment Apply 1 application topically 2 (two) times daily. 12/08/20   Rancour, Jeannett SeniorStephen, MD  cholecalciferol (VITAMIN D3) 25 MCG (1000 UNIT) tablet Take 1,000 Units by mouth daily.    [provider]  ketoconazole (NIZORAL) 2 % shampoo Apply  1 application topically 2 (two) times a week. Shampoo on Tuesday. Bathe on Friday.    [provider]  lactulose (CHRONULAC) 10 GM/15ML solution Take 10 g by mouth daily as needed for mild constipation.    [provider]  levothyroxine (SYNTHROID) 100 MCG tablet Take 100 mcg by mouth daily before breakfast.    [provider]  polyethylene glycol  (MIRALAX / GLYCOLAX) 17 g packet Take 17 g by mouth daily.    [provider]  potassium chloride SA (KLOR-CON) 20 MEQ tablet Take 20 mEq by mouth 2 (two) times daily.    [provider]  pramipexole (MIRAPEX) 1 MG tablet Take 1 mg by mouth at bedtime.    [provider]  sennosides-docusate sodium (SENOKOT-S) 8.6-50 MG tablet Take 2 tablets by mouth at bedtime.    [provider]  spironolactone (ALDACTONE) 25 MG tablet Take 25 mg by mouth daily.    [provider]  torsemide (DEMADEX) 20 MG tablet Take 3 tablets (60 mg total) by mouth daily. 10/04/20   Cleora Fleet, MD    Physical Exam: Vitals:   08/18/21 1300 08/18/21 1400 08/18/21 1435 08/18/21 1456  BP: 110/71 108/73 111/75   Pulse: 67 70 74 74  Resp: 19 (!) 21 19 20   Temp:      TempSrc:      SpO2: 97% 96% 98% 95%  Weight:      Height:       General exam: Alert, awake, oriented x 3; complaining of orthopnea; good saturation appreciated on chronic 3 L nasal cannula supplementation.  But demonstrating mild increased work of breathing especially if he try to laying down. Respiratory system: Positive tachypnea (respiratory rate 23); fine crackles appreciated at the bases along with a scattered rhonchi. Cardiovascular system: Rate controlled, no rubs, no gallops; unable to assess JVD with body habitus. Gastrointestinal system: Abdomen is obese nondistended, soft and nontender.  Positive bowel sounds appreciated.  Increased abdominal girth especially in flank areas noticed. Central nervous system: Alert and oriented. No focal neurological deficits. Extremities: No cyanosis or clubbing.  2-3+ edema appreciated all the way to his belly. Skin: No petechiae. Psychiatry: Judgement and insight appear normal. Mood & affect appropriate.   Data Reviewed: Basic metabolic panel demonstrating sodium 142, potassium 4.7, chloride 93, bicarb 43 (base on chart review appears to be chronic metabolic  alkalosis to compensate chronic respiratory acidosis most likely); BUN 22 and creatinine 0.98. Magnesium 1.9 BNP 44 (falsely low due to obesity) CBC demonstrating WBCs 9.0, hemoglobin 15.1, platelet count 166 K Troponin 17 Phosphorus 4.0  Assessment and Plan: * Acute on chronic diastolic heart failure (HCC) - Patient will be admitted to telemetry. -Update 2D echo -Follow daily weights, low-sodium diet and strict intake and output -Measure lungs volume percent ([employer]) -Aggressive diuresis with the use of IV Lasix and metolazone -Holding spironolactone acutely and following renal function/electrolytes closely.   Chronic respiratory failure with hypoxia (HCC) -Continue oxygen supplementation -Patient uses 3-4 L at baseline. -Saturation is stable currently. -Patient shortness of breath/orthopnea reported in the setting of acute on chronic diastolic heart failure. -Follow-up vital signs.  Restless leg syndrome - Resume the use of Mirapex.  Obesity, Class III, BMI 40-49.9 (morbid obesity) (HCC) -Body mass index is 44.29 kg/m. -Low calorie diet, portion control and increase physical activity discussed with patient.  Hypothyroidism -Update TSH -Continue Synthroid  Hyperlipidemia - Continue Lipitor.      Advance Care Planning:   Code Status: DNR  Consults: None currently; the patient will benefit of being seen by cardiology service based on clinical response to diuresis.  Family Communication: No family at bedside.  Severity of Illness: The appropriate patient status for this patient is INPATIENT. Inpatient status is judged to be reasonable and necessary in order to provide the required intensity of service to ensure the patient's safety. The patient's presenting symptoms, physical exam findings, and initial radiographic and laboratory data in the context of their chronic comorbidities is felt to place them at high risk for further clinical deterioration. Furthermore, it  is not anticipated that the patient will be medically stable for discharge from the hospital within 2 midnights of admission.   * I certify that at the point of admission it is my clinical judgment that the patient will require inpatient hospital care spanning beyond 2 midnights from the point of admission due to high intensity of service, high risk for further deterioration and high frequency of surveillance required.*  Author: Vassie Loll, MD 08/18/2021 3:57 PM  For on call review [email or web address].

## 2021-08-18 NOTE — ED Triage Notes (Addendum)
Patient arrived via EMS from [facility] with complaints of SOB and increased fluid retention despite increased medications. Upon assessment patients abdomen is distended. Per patient he has gained 20lb in 2 weeks.

## 2021-08-18 NOTE — Assessment & Plan Note (Signed)
-  Update TSH -Continue Synthroid. 

## 2021-08-18 NOTE — Assessment & Plan Note (Signed)
-  Continue oxygen supplementation -Patient uses 3L at baseline.

## 2021-08-19 ENCOUNTER — Inpatient Hospital Stay (HOSPITAL_COMMUNITY): Payer: Medicare Other

## 2021-08-19 DIAGNOSIS — I5033 Acute on chronic diastolic (congestive) heart failure: Secondary | ICD-10-CM

## 2021-08-19 DIAGNOSIS — I509 Heart failure, unspecified: Secondary | ICD-10-CM | POA: Diagnosis not present

## 2021-08-19 DIAGNOSIS — J9611 Chronic respiratory failure with hypoxia: Secondary | ICD-10-CM | POA: Diagnosis not present

## 2021-08-19 DIAGNOSIS — R601 Generalized edema: Secondary | ICD-10-CM

## 2021-08-19 LAB — BASIC METABOLIC PANEL
BUN: 22 mg/dL (ref 8–23)
CO2: >45 mmol/L — ABNORMAL HIGH (ref 22–32)
Calcium: 9.3 mg/dL (ref 8.9–10.3)
Chloride: 85 mmol/L — ABNORMAL LOW (ref 98–111)
Creatinine, Ser: 1.04 mg/dL (ref 0.61–1.24)
GFR, Estimated: >60 mL/min (ref >60)
Glucose, Bld: 106 mg/dL — ABNORMAL HIGH (ref 70–99)
Potassium: 3.8 mmol/L (ref 3.5–5.1)
Sodium: 142 mmol/L (ref 135–145)

## 2021-08-19 LAB — ECHOCARDIOGRAM COMPLETE
AR max vel: 1.31 cm2
AV Area VTI: 1.39 cm2
AV Area mean vel: 1.36 cm2
AV Mean grad: 21.5 mmHg
AV Peak grad: 43.7 mmHg
Ao pk vel: 3.31 m/s
Area-P 1/2: 2.37 cm2
Height: 69 in
MV VTI: 3.13 cm2
S' Lateral: 3.1 cm
Weight: 4448 oz

## 2021-08-19 MED ORDER — METOLAZONE 5 MG PO TABS
2.5000 mg | ORAL_TABLET | Freq: Every day | ORAL | Status: DC
  Administered 2021-08-20: 2.5 mg via ORAL
  Filled 2021-08-19: qty 1

## 2021-08-19 MED ORDER — PERFLUTREN LIPID MICROSPHERE
1.0000 mL | INTRAVENOUS | Status: AC | PRN
  Administered 2021-08-19: 6 mL via INTRAVENOUS

## 2021-08-19 NOTE — TOC Progression Note (Signed)
  Transition of Care [facility]) Screening Note   Patient Details  Name: Vernon Campbell Date of Birth: 02-22-1941   Transition of Care [facility]) CM/SW Contact:    Leitha Bleak, RN Phone Number: 08/19/2021, 2:29 PM  TOC following, could not reach son to assess. Patient is from [place].    Transition of Care Department [facility]) has reviewed patient and no TOC needs have been identified at this time. We will continue to monitor patient advancement through interdisciplinary progression rounds. If new patient transition needs arise, please place a TOC consult.    Expected Discharge Plan: Skilled Nursing Facility Barriers to Discharge: Continued Medical Work up  Expected Discharge Plan and Services Expected Discharge Plan: Skilled Nursing Facility

## 2021-08-19 NOTE — Progress Notes (Signed)
*  PRELIMINARY RESULTS* Echocardiogram 2D Echocardiogram has been performed.  Vernon Campbell 08/19/2021, 11:27 AM

## 2021-08-19 NOTE — Progress Notes (Addendum)
   08/19/21 1135  ReDS Vest / Clip  Station Marker D  Ruler Value 38  ReDS Value Range 36 - 40  ReDS Actual Value 38   Notified Dr Tat.

## 2021-08-19 NOTE — Hospital Course (Signed)
81 y.o. male with medical history significant of chronic respiratory failure with hypoxia using  3 L nasal cannula supplementation at baseline; hyperlipidemia, hypothyroidism, morbid obesity, chronic diastolic heart failure, restless leg syndrome and more; who presented to the hospital from his nursing home secondary to increased lower extremity swelling, short winded sensation and difficulty laying down flat.  Patient reports symptom has been present for approximately a week and worsening.  Despite adjustment in his oral diuretics regimen as an outpatient he continued to notice an weight gain and more difficulty breathing at bedtime.  Patient reports no fever, no productive cough, no sick contacts, no dysuria, no hematuria, no focal weakness, no chest pain, no hemoptysis or any other complaints.   In the ED BMP was low (probably in the setting of obesity); chest x-ray demonstrating vascular congestion/interstitial edema.  Per records patient approximately 20 pounds above baseline.  IV diuresis initiated and TRH contacted to place patient in the hospital for further evaluation and management.

## 2021-08-19 NOTE — Progress Notes (Signed)
PROGRESS NOTE  Vernon Campbell [id number] DOB: Nov 27, 1940 DOA: 08/18/2021 PCP: System, Provider Not In  Brief History:   81 y.o. male with medical history significant of chronic respiratory failure with hypoxia using  3 L nasal cannula supplementation at baseline; hyperlipidemia, hypothyroidism, morbid obesity, chronic diastolic heart failure, restless leg syndrome and more; who presented to the hospital from his nursing home secondary to increased lower extremity swelling, short winded sensation and difficulty laying down flat.  Patient reports symptom has been present for approximately a week and worsening.  Despite adjustment in his oral diuretics regimen as an outpatient he continued to notice an weight gain and more difficulty breathing at bedtime.  Patient reports no fever, no productive cough, no sick contacts, no dysuria, no hematuria, no focal weakness, no chest pain, no hemoptysis or any other complaints.   In the ED BMP was low (probably in the setting of obesity); chest x-ray demonstrating vascular congestion/interstitial edema.  Per records patient approximately 20 pounds above baseline.  IV diuresis initiated and TRH contacted to place patient in the hospital for further evaluation and management.     Assessment and Plan: * Acute on chronic diastolic heart failure [facility]) - Patient will be admitted to telemetry. -5/31 echo--EF 65-70%, mod AS, G1DD -Follow daily weights, low-sodium diet and strict intake and output -ReDS = 38 -continue lasix 60 mg IV bid -continue metolazone   Chronic respiratory failure with hypoxia (HCC) -Continue oxygen supplementation -Patient uses 3L at baseline.  Restless leg syndrome - Resume the use of Mirapex.  Obesity, Class III, BMI 40-49.9 (morbid obesity) (HCC) -Body mass index is 44.29 kg/m. -Low calorie diet, portion control and increase physical activity discussed with patient.  Hypothyroidism -TSH--1.108 -Continue  Synthroid  Hyperlipidemia - Continue Lipitor.            Family Communication:   no Family at bedside  Consultants:  none  Code Status: DNR  DVT Prophylaxis:  [place] Lovenox   Procedures: As Listed in Progress Note Above  Antibiotics: None       Subjective: Patient is breathing better, but complains of dyspnea on exertion.  Denies f/c, cp, n/v/d  Objective: Vitals:   08/18/21 2048 08/19/21 0327 08/19/21 0500 08/19/21 1209  BP: 103/64 (!) 116/59  117/70  Pulse: 81 86  75  Resp: (!) 22 20  18   Temp: 98.5 F (36.9 C) 97.6 F (36.4 C)  98.3 F (36.8 C)  TempSrc: Oral Oral  Oral  SpO2: 93% 93%  93%  Weight:   126.1 kg   Height:        Intake/Output Summary (Last 24 hours) at 08/19/2021 1843 Last data filed at 08/19/2021 1519 Gross per 24 hour  Intake 1430 ml  Output 3050 ml  Net -1620 ml   Weight change:  Exam:  General:  Pt is alert, follows commands appropriately, not in acute distress HEENT: No icterus, No thrush, No neck mass, [place]/AT Cardiovascular: RRR, S1/S2, no rubs, no gallops Respiratory: bibasilar crackles.  No wheeze Abdomen: Soft/+BS, non tender, non distended, no guarding Extremities: 2 +LE edema, No lymphangitis, No petechiae, No rashes, no synovitis   Data Reviewed: I have personally reviewed following labs and imaging studies Basic Metabolic Panel: Recent Labs  Lab 08/18/21 1255 08/18/21 1541 08/19/21 0502  NA 142  --  142  K 4.7  --  3.8  CL 93*  --  85*  CO2 43*  --  >45*  GLUCOSE 78  --  106*  BUN 22  --  22  CREATININE 0.98 0.94 1.04  CALCIUM 8.9  --  9.3  MG 1.9 1.9  --   PHOS  --  4.0  --    Liver Function Tests: No results for input(s): AST, ALT, ALKPHOS, BILITOT, PROT, ALBUMIN in the last 168 hours. No results for input(s): LIPASE, AMYLASE in the last 168 hours. No results for input(s): AMMONIA in the last 168 hours. Coagulation Profile: No results for input(s): INR, PROTIME in the last 168  hours. CBC: Recent Labs  Lab 08/18/21 1255 08/18/21 1541  WBC 9.0 7.9  HGB 15.1 15.5  HCT 52.2* 52.7*  MCV 108.1* 106.9*  PLT 166 167   Cardiac Enzymes: No results for input(s): CKTOTAL, CKMB, CKMBINDEX, TROPONINI in the last 168 hours. BNP: Invalid input(s): POCBNP CBG: No results for input(s): GLUCAP in the last 168 hours. HbA1C: No results for input(s): HGBA1C in the last 72 hours. Urine analysis: No results found for: COLORURINE, APPEARANCEUR, LABSPEC, PHURINE, GLUCOSEU, HGBUR, BILIRUBINUR, KETONESUR, PROTEINUR, UROBILINOGEN, NITRITE, LEUKOCYTESUR Sepsis Labs: @LABRCNTIP (procalcitonin:4,lacticidven:4) )No results found for this or any previous visit (from the past 240 hour(s)).   Scheduled Meds:  atorvastatin  20 mg Oral Daily   cholecalciferol  1,000 Units Oral Daily   enoxaparin (LOVENOX) injection  40 mg Subcutaneous Q24H   furosemide  80 mg Intravenous Q12H   levothyroxine  100 mcg Oral QAC breakfast   [START ON 08/20/2021] metolazone  2.5 mg Oral Daily   potassium chloride SA  20 mEq Oral Daily   pramipexole  1 mg Oral QHS   senna-docusate  2 tablet Oral QHS   sodium chloride flush  3 mL Intravenous Q12H   Continuous Infusions:  sodium chloride      Procedures/Studies: DG Chest Port 1 View  Result Date: 08/18/2021 CLINICAL DATA:  Shortness of breath and LOWER extremity swelling. History of heart failure. EXAM: PORTABLE CHEST 1 VIEW COMPARISON:  12/08/2020 chest radiograph and prior studies FINDINGS: Cardiomegaly with pulmonary vascular congestion noted. Increasing interstitial opacities identified, likely representing edema. LEFT basilar atelectasis/scarring again noted. No pneumothorax or large pleural effusion identified. IMPRESSION: Cardiomegaly with pulmonary vascular congestion. Increasing interstitial opacities likely representing edema. Electronically Signed   By: Margarette Canada M.D.   On: 08/18/2021 12:47   ECHOCARDIOGRAM COMPLETE  Result Date: 08/19/2021     ECHOCARDIOGRAM REPORT   Patient Name:   Vernon Campbell Date of Exam: 08/19/2021 Medical Rec #:  [id number]  Height:       69.0 in Accession #:    [id number] Weight:       278.0 lb Date of Birth:  1941-01-17  BSA:          2.376 m Patient Age:    81 years   BP:           116/59 mmHg Patient Gender: M          HR:           82 bpm. Exam Location:  [facility] Procedure: 2D Echo, Cardiac Doppler, Color Doppler and Intracardiac            Opacification Agent Indications:    CHF  History:        Patient has prior history of Echocardiogram examinations, most                 recent 10/02/2020. CHF; Risk Factors:Dyslipidemia.  Sonographer:    Wenda Low Referring Phys: [place]  Sonographer Comments: Patient is morbidly obese. IMPRESSIONS  1. Left ventricular ejection fraction, by estimation, is 65 to 70%. The left ventricle has normal function. The left ventricle has no regional wall motion abnormalities. There is moderate left ventricular hypertrophy. Left ventricular diastolic parameters are consistent with Grade I diastolic dysfunction (impaired relaxation).  2. Right ventricular systolic function is normal. The right ventricular size is normal. Tricuspid regurgitation signal is inadequate for assessing PA pressure.  3. Left atrial size was mildly dilated.  4. The mitral valve is normal in structure. No evidence of mitral valve regurgitation. No evidence of mitral stenosis.  5. The aortic valve is tricuspid. Aortic valve regurgitation is not visualized. Moderate aortic valve stenosis. FINDINGS  Left Ventricle: Left ventricular ejection fraction, by estimation, is 65 to 70%. The left ventricle has normal function. The left ventricle has no regional wall motion abnormalities. Definity contrast agent was given IV to delineate the left ventricular  endocardial borders. The left ventricular internal cavity size was normal in size. There is moderate left ventricular hypertrophy. Left ventricular diastolic parameters  are consistent with Grade I diastolic dysfunction (impaired relaxation). Normal left  ventricular filling pressure. Right Ventricle: The right ventricular size is normal. Right vetricular wall thickness was not well visualized. Right ventricular systolic function is normal. Tricuspid regurgitation signal is inadequate for assessing PA pressure. Left Atrium: Left atrial size was mildly dilated. Right Atrium: Right atrial size was normal in size. Pericardium: The pericardium was not well visualized. Mitral Valve: The mitral valve is normal in structure. There is mild thickening of the mitral valve leaflet(s). There is mild calcification of the mitral valve leaflet(s). Mild mitral annular calcification. No evidence of mitral valve regurgitation. No evidence of mitral valve stenosis. MV peak gradient, 4.6 mmHg. The mean mitral valve gradient is 2.0 mmHg. Tricuspid Valve: The tricuspid valve is not well visualized. Tricuspid valve regurgitation is trivial. No evidence of tricuspid stenosis. Aortic Valve: The aortic valve is tricuspid. Aortic valve regurgitation is not visualized. Moderate aortic stenosis is present. Aortic valve mean gradient measures 21.5 mmHg. Aortic valve peak gradient measures 43.7 mmHg. Aortic valve area, by VTI measures 1.39 cm. Pulmonic Valve: The pulmonic valve was not well visualized. Pulmonic valve regurgitation is not visualized. No evidence of pulmonic stenosis. Aorta: The aortic root was not well visualized. Venous: The inferior vena cava was not well visualized. IAS/Shunts: The interatrial septum was not well visualized.  LEFT VENTRICLE PLAX 2D LVIDd:         5.00 cm   Diastology LVIDs:         3.10 cm   LV e' medial:    5.55 cm/s LV PW:         1.30 cm   LV E/e' medial:  14.4 LV IVS:        1.30 cm   LV e' lateral:   7.40 cm/s LVOT diam:     2.00 cm   LV E/e' lateral: 10.8 LV SV:         92 LV SV Index:   39 LVOT Area:     3.14 cm  RIGHT VENTRICLE RV Basal diam:  4.40 cm RV Mid diam:     4.10 cm RV S prime:     15.10 cm/s TAPSE (M-mode): 3.0 cm LEFT ATRIUM             Index        RIGHT ATRIUM           Index LA  diam:        4.70 cm 1.98 cm/m   RA Area:     26.00 cm LA Vol (A2C):   89.8 ml 37.79 ml/m  RA Volume:   83.20 ml  35.01 ml/m LA Vol (A4C):   86.6 ml 36.44 ml/m LA Biplane Vol: 88.9 ml 37.41 ml/m  AORTIC VALVE                     PULMONIC VALVE AV Area (Vmax):    1.31 cm      PV Vmax:       0.96 m/s AV Area (Vmean):   1.36 cm      PV Peak grad:  3.7 mmHg AV Area (VTI):     1.39 cm AV Vmax:           330.50 cm/s AV Vmean:          207.500 cm/s AV VTI:            0.659 m AV Peak Grad:      43.7 mmHg AV Mean Grad:      21.5 mmHg LVOT Vmax:         138.00 cm/s LVOT Vmean:        89.600 cm/s LVOT VTI:          0.292 m LVOT/AV VTI ratio: 0.44  AORTA Ao Root diam: 3.20 cm Ao Asc diam:  3.00 cm MITRAL VALVE MV Area (PHT): 2.37 cm    SHUNTS MV Area VTI:   3.13 cm    Systemic VTI:  0.29 m MV Peak grad:  4.6 mmHg    Systemic Diam: 2.00 cm MV Mean grad:  2.0 mmHg MV Vmax:       1.07 m/s MV Vmean:      60.4 cm/s MV Decel Time: 320 msec MV E velocity: 80.10 cm/s MV A velocity: 97.30 cm/s MV E/A ratio:  0.82 Carlyle Dolly MD Electronically signed by Carlyle Dolly MD Signature Date/Time: 08/19/2021/11:48:37 AM    Final     Orson Eva, DO  [facility]  If 7PM-7AM, please contact night-coverage [email or web address] Password [facility] 08/19/2021, 6:43 PM   LOS: 1 day

## 2021-08-20 DIAGNOSIS — I5033 Acute on chronic diastolic (congestive) heart failure: Secondary | ICD-10-CM | POA: Diagnosis not present

## 2021-08-20 DIAGNOSIS — J9611 Chronic respiratory failure with hypoxia: Secondary | ICD-10-CM | POA: Diagnosis not present

## 2021-08-20 DIAGNOSIS — E876 Hypokalemia: Secondary | ICD-10-CM

## 2021-08-20 LAB — BASIC METABOLIC PANEL
BUN: 26 mg/dL — ABNORMAL HIGH (ref 8–23)
CO2: >45 mmol/L — ABNORMAL HIGH (ref 22–32)
Calcium: 9.3 mg/dL (ref 8.9–10.3)
Chloride: 75 mmol/L — ABNORMAL LOW (ref 98–111)
Creatinine, Ser: 1.26 mg/dL — ABNORMAL HIGH (ref 0.61–1.24)
GFR, Estimated: 57 mL/min — ABNORMAL LOW (ref >60)
Glucose, Bld: 124 mg/dL — ABNORMAL HIGH (ref 70–99)
Potassium: 3.4 mmol/L — ABNORMAL LOW (ref 3.5–5.1)
Sodium: 138 mmol/L (ref 135–145)

## 2021-08-20 MED ORDER — POTASSIUM CHLORIDE CRYS ER 20 MEQ PO TBCR
20.0000 meq | EXTENDED_RELEASE_TABLET | Freq: Once | ORAL | Status: AC
  Administered 2021-08-20: 20 meq via ORAL
  Filled 2021-08-20: qty 1

## 2021-08-20 MED ORDER — HYDROCORTISONE 1 % EX OINT: TOPICAL_OINTMENT | Freq: Four times a day (QID) | CUTANEOUS | Status: DC | PRN

## 2021-08-20 MED ORDER — HYDROCODONE-ACETAMINOPHEN 5-325 MG PO TABS
1.0000 | ORAL_TABLET | Freq: Four times a day (QID) | ORAL | Status: DC | PRN
  Administered 2021-08-20: 1 via ORAL
  Filled 2021-08-20 (×2): qty 1

## 2021-08-20 NOTE — Progress Notes (Signed)
PROGRESS NOTE  Vernon Campbell [id number] DOB: [birth year] DOA: 08/18/2021 PCP: System, Provider Not In  Brief History:   81 y.o. male with medical history significant of chronic respiratory failure with hypoxia using  3 L nasal cannula supplementation at baseline; hyperlipidemia, hypothyroidism, morbid obesity, chronic diastolic heart failure, restless leg syndrome and more; who presented to the hospital from his nursing home secondary to increased lower extremity swelling, short winded sensation and difficulty laying down flat.  Patient reports symptom has been present for approximately a week and worsening.  Despite adjustment in his oral diuretics regimen as an outpatient he continued to notice an weight gain and more difficulty breathing at bedtime.  Patient reports no fever, no productive cough, no sick contacts, no dysuria, no hematuria, no focal weakness, no chest pain, no hemoptysis or any other complaints.   In the ED BMP was low (probably in the setting of obesity); chest x-ray demonstrating vascular congestion/interstitial edema.  Per records patient approximately 20 pounds above baseline.  IV diuresis initiated and TRH contacted to place patient in the hospital for further evaluation and management.     Assessment and Plan: * Acute on chronic diastolic heart failure [facility]) - Patient will be admitted to telemetry. -5/31 echo--EF 65-70%, mod AS, G1DD -Follow daily weights, low-sodium diet and strict intake and output -ReDS = 38 -continue lasix 60 mg IV bid>>increased to 80 mg bid -continue metolazone -transition to po torsemide 6/2  Chronic respiratory failure with hypoxia (HCC) -Continue oxygen supplementation -Patient uses 3L at baseline.  Hypokalemia Replete Check mag  Restless leg syndrome - Resumed  Mirapex - norco prn severe pain  Obesity, Class III, BMI 40-49.9 (morbid obesity) (HCC) -Body mass index is 44.29 kg/m. -Low calorie diet, portion control and  increase physical activity discussed with patient.  Hypothyroidism -TSH--1.108 -Continue Synthroid  Hyperlipidemia - Continue Lipitor.   Family Communication:   no Family at bedside   Consultants:  none   Code Status: DNR   DVT Prophylaxis:  [place] Lovenox     Procedures: As Listed in Progress Note Above   Antibiotics: None      Subjective: Pt states he is breathing better.  He is able to lie flat.  Denies f/c, cp, sob, n/v/d, abd pain  Objective: Vitals:   08/19/21 2112 08/20/21 0500 08/20/21 0623 08/20/21 1359  BP: 103/61  116/63 105/73  Pulse: 74  84 71  Resp: 19  19 18   Temp: 98.5 F (36.9 C)  98.5 F (36.9 C) 97.7 F (36.5 C)  TempSrc: Oral  Oral Oral  SpO2: 91%  93% 92%  Weight:  123.1 kg    Height:  5\' 9"  (1.753 m)      Intake/Output Summary (Last 24 hours) at 08/20/2021 1725 Last data filed at 08/20/2021 1300 Gross per 24 hour  Intake 723 ml  Output 4551 ml  Net -3828 ml   Weight change: -13 kg Exam:  General:  Pt is alert, follows commands appropriately, not in acute distress HEENT: No icterus, No thrush, No neck mass, [place]/AT Cardiovascular: RRR, S1/S2, no rubs, no gallops Respiratory: fine bibasilar crackles.  No wheeze Abdomen: Soft/+BS, non tender, non distended, no guarding Extremities: 1 + LE edema, No lymphangitis, No petechiae, No rashes, no synovitis   Data Reviewed: I have personally reviewed following labs and imaging studies Basic Metabolic Panel: Recent Labs  Lab 08/18/21 1255 08/18/21 1541 08/19/21 0502 08/20/21 0516  NA 142  --  142 138  K 4.7  --  3.8 3.4*  CL 93*  --  85* 75*  CO2 43*  --  >45* >45*  GLUCOSE 78  --  106* 124*  BUN 22  --  22 26*  CREATININE 0.98 0.94 1.04 1.26*  CALCIUM 8.9  --  9.3 9.3  MG 1.9 1.9  --   --   PHOS  --  4.0  --   --    Liver Function Tests: No results for input(s): AST, ALT, ALKPHOS, BILITOT, PROT, ALBUMIN in the last 168 hours. No results for input(s): LIPASE, AMYLASE in the last  168 hours. No results for input(s): AMMONIA in the last 168 hours. Coagulation Profile: No results for input(s): INR, PROTIME in the last 168 hours. CBC: Recent Labs  Lab 08/18/21 1255 08/18/21 1541  WBC 9.0 7.9  HGB 15.1 15.5  HCT 52.2* 52.7*  MCV 108.1* 106.9*  PLT 166 167   Cardiac Enzymes: No results for input(s): CKTOTAL, CKMB, CKMBINDEX, TROPONINI in the last 168 hours. BNP: Invalid input(s): POCBNP CBG: No results for input(s): GLUCAP in the last 168 hours. HbA1C: No results for input(s): HGBA1C in the last 72 hours. Urine analysis: No results found for: COLORURINE, APPEARANCEUR, LABSPEC, PHURINE, GLUCOSEU, HGBUR, BILIRUBINUR, KETONESUR, PROTEINUR, UROBILINOGEN, NITRITE, LEUKOCYTESUR Sepsis Labs: @LABRCNTIP (procalcitonin:4,lacticidven:4) )No results found for this or any previous visit (from the past 240 hour(s)).   Scheduled Meds:  atorvastatin  20 mg Oral Daily   cholecalciferol  1,000 Units Oral Daily   enoxaparin (LOVENOX) injection  40 mg Subcutaneous Q24H   levothyroxine  100 mcg Oral QAC breakfast   potassium chloride SA  20 mEq Oral Daily   potassium chloride  20 mEq Oral Once   pramipexole  1 mg Oral QHS   senna-docusate  2 tablet Oral QHS   sodium chloride flush  3 mL Intravenous Q12H   Continuous Infusions:  sodium chloride      Procedures/Studies: DG Chest Port 1 View  Result Date: 08/18/2021 CLINICAL DATA:  Shortness of breath and LOWER extremity swelling. History of heart failure. EXAM: PORTABLE CHEST 1 VIEW COMPARISON:  12/08/2020 chest radiograph and prior studies FINDINGS: Cardiomegaly with pulmonary vascular congestion noted. Increasing interstitial opacities identified, likely representing edema. LEFT basilar atelectasis/scarring again noted. No pneumothorax or large pleural effusion identified. IMPRESSION: Cardiomegaly with pulmonary vascular congestion. Increasing interstitial opacities likely representing edema. Electronically Signed   By:  Margarette Canada M.D.   On: 08/18/2021 12:47   ECHOCARDIOGRAM COMPLETE  Result Date: 08/19/2021    ECHOCARDIOGRAM REPORT   Patient Name:   CORNELIOUS Campbell Date of Exam: 08/19/2021 Medical Rec #:  [id number]  Height:       69.0 in Accession #:    [id number] Weight:       278.0 lb Date of Birth:  [birth year]  BSA:          2.376 m Patient Age:    15 years   BP:           116/59 mmHg Patient Gender: M          HR:           82 bpm. Exam Location[facility]  [facility] Procedure: 2D Echo, Cardiac Doppler, Color Doppler and Intracardiac            Opacification Agent Indications:    CHF  History:        Patient has prior history of Echocardiogram examinations, most  recent 10/02/2020. CHF; Risk Factors:Dyslipidemia.  Sonographer:    Wenda Low Referring Phys: [place]  Sonographer Comments: Patient is morbidly obese. IMPRESSIONS  1. Left ventricular ejection fraction, by estimation, is 65 to 70%. The left ventricle has normal function. The left ventricle has no regional wall motion abnormalities. There is moderate left ventricular hypertrophy. Left ventricular diastolic parameters are consistent with Grade I diastolic dysfunction (impaired relaxation).  2. Right ventricular systolic function is normal. The right ventricular size is normal. Tricuspid regurgitation signal is inadequate for assessing PA pressure.  3. Left atrial size was mildly dilated.  4. The mitral valve is normal in structure. No evidence of mitral valve regurgitation. No evidence of mitral stenosis.  5. The aortic valve is tricuspid. Aortic valve regurgitation is not visualized. Moderate aortic valve stenosis. FINDINGS  Left Ventricle: Left ventricular ejection fraction, by estimation, is 65 to 70%. The left ventricle has normal function. The left ventricle has no regional wall motion abnormalities. Definity contrast agent was given IV to delineate the left ventricular  endocardial borders. The left ventricular internal cavity size was  normal in size. There is moderate left ventricular hypertrophy. Left ventricular diastolic parameters are consistent with Grade I diastolic dysfunction (impaired relaxation). Normal left  ventricular filling pressure. Right Ventricle: The right ventricular size is normal. Right vetricular wall thickness was not well visualized. Right ventricular systolic function is normal. Tricuspid regurgitation signal is inadequate for assessing PA pressure. Left Atrium: Left atrial size was mildly dilated. Right Atrium: Right atrial size was normal in size. Pericardium: The pericardium was not well visualized. Mitral Valve: The mitral valve is normal in structure. There is mild thickening of the mitral valve leaflet(s). There is mild calcification of the mitral valve leaflet(s). Mild mitral annular calcification. No evidence of mitral valve regurgitation. No evidence of mitral valve stenosis. MV peak gradient, 4.6 mmHg. The mean mitral valve gradient is 2.0 mmHg. Tricuspid Valve: The tricuspid valve is not well visualized. Tricuspid valve regurgitation is trivial. No evidence of tricuspid stenosis. Aortic Valve: The aortic valve is tricuspid. Aortic valve regurgitation is not visualized. Moderate aortic stenosis is present. Aortic valve mean gradient measures 21.5 mmHg. Aortic valve peak gradient measures 43.7 mmHg. Aortic valve area, by VTI measures 1.39 cm. Pulmonic Valve: The pulmonic valve was not well visualized. Pulmonic valve regurgitation is not visualized. No evidence of pulmonic stenosis. Aorta: The aortic root was not well visualized. Venous: The inferior vena cava was not well visualized. IAS/Shunts: The interatrial septum was not well visualized.  LEFT VENTRICLE PLAX 2D LVIDd:         5.00 cm   Diastology LVIDs:         3.10 cm   LV e' medial:    5.55 cm/s LV PW:         1.30 cm   LV E/e' medial:  14.4 LV IVS:        1.30 cm   LV e' lateral:   7.40 cm/s LVOT diam:     2.00 cm   LV E/e' lateral: 10.8 LV SV:          92 LV SV Index:   39 LVOT Area:     3.14 cm  RIGHT VENTRICLE RV Basal diam:  4.40 cm RV Mid diam:    4.10 cm RV S prime:     15.10 cm/s TAPSE (M-mode): 3.0 cm LEFT ATRIUM             Index  RIGHT ATRIUM           Index LA diam:        4.70 cm 1.98 cm/m   RA Area:     26.00 cm LA Vol (A2C):   89.8 ml 37.79 ml/m  RA Volume:   83.20 ml  35.01 ml/m LA Vol (A4C):   86.6 ml 36.44 ml/m LA Biplane Vol: 88.9 ml 37.41 ml/m  AORTIC VALVE                     PULMONIC VALVE AV Area (Vmax):    1.31 cm      PV Vmax:       0.96 m/s AV Area (Vmean):   1.36 cm      PV Peak grad:  3.7 mmHg AV Area (VTI):     1.39 cm AV Vmax:           330.50 cm/s AV Vmean:          207.500 cm/s AV VTI:            0.659 m AV Peak Grad:      43.7 mmHg AV Mean Grad:      21.5 mmHg LVOT Vmax:         138.00 cm/s LVOT Vmean:        89.600 cm/s LVOT VTI:          0.292 m LVOT/AV VTI ratio: 0.44  AORTA Ao Root diam: 3.20 cm Ao Asc diam:  3.00 cm MITRAL VALVE MV Area (PHT): 2.37 cm    SHUNTS MV Area VTI:   3.13 cm    Systemic VTI:  0.29 m MV Peak grad:  4.6 mmHg    Systemic Diam: 2.00 cm MV Mean grad:  2.0 mmHg MV Vmax:       1.07 m/s MV Vmean:      60.4 cm/s MV Decel Time: 320 msec MV E velocity: 80.10 cm/s MV A velocity: 97.30 cm/s MV E/A ratio:  0.82 Carlyle Dolly MD Electronically signed by Carlyle Dolly MD Signature Date/Time: 08/19/2021/11:48:37 AM    Final     Orson Eva, DO  [facility]  If 7PM-7AM, please contact night-coverage [email or web address] Password [email or web address] 08/20/2021, 5:25 PM   LOS: 2 days

## 2021-08-20 NOTE — TOC Progression Note (Signed)
Transition of Care [facility]) - Progression Note    Patient Details  Name: Vernon Campbell MRN: [id number] Date of Birth: 1940-07-22  [facility]) CM/SW Contact  Leitha Bleak, RN Phone Number: 08/20/2021, 1:35 PM  Clinical Narrative:   Jory Sims updated Melissa at [facility] with discharge plan for Saturday. [facility]2 started TOC to follow.     Expected Discharge Plan: Skilled Nursing Facility Barriers to Discharge: Continued Medical Work up  Expected Discharge Plan and Services Expected Discharge Plan: Skilled Nursing Facility

## 2021-08-20 NOTE — Assessment & Plan Note (Addendum)
Replete Check mag

## 2021-08-21 DIAGNOSIS — I5033 Acute on chronic diastolic (congestive) heart failure: Secondary | ICD-10-CM | POA: Diagnosis not present

## 2021-08-21 DIAGNOSIS — J9611 Chronic respiratory failure with hypoxia: Secondary | ICD-10-CM | POA: Diagnosis not present

## 2021-08-21 LAB — BASIC METABOLIC PANEL
BUN: 35 mg/dL — ABNORMAL HIGH (ref 8–23)
CO2: >45 mmol/L — ABNORMAL HIGH (ref 22–32)
Calcium: 9.3 mg/dL (ref 8.9–10.3)
Chloride: 73 mmol/L — ABNORMAL LOW (ref 98–111)
Creatinine, Ser: 1.34 mg/dL — ABNORMAL HIGH (ref 0.61–1.24)
GFR, Estimated: 53 mL/min — ABNORMAL LOW (ref >60)
Glucose, Bld: 149 mg/dL — ABNORMAL HIGH (ref 70–99)
Potassium: 3.5 mmol/L (ref 3.5–5.1)
Sodium: 137 mmol/L (ref 135–145)

## 2021-08-21 LAB — MAGNESIUM: Magnesium: 2 mg/dL (ref 1.7–2.4)

## 2021-08-21 MED ORDER — HYDROCODONE-ACETAMINOPHEN 5-325 MG PO TABS: 1.0000 | ORAL_TABLET | Freq: Three times a day (TID) | ORAL | 0 refills | Status: DC

## 2021-08-21 MED ORDER — TORSEMIDE 20 MG PO TABS: 60.0000 mg | ORAL_TABLET | Freq: Every day | ORAL | Status: DC

## 2021-08-21 NOTE — Progress Notes (Signed)
Called report to [place] for [employer]

## 2021-08-21 NOTE — Consult Note (Signed)
WOC Nurse Consult Note: Patient receiving care in [facility]. Consult completed remotely after Secure Chat with Dr. Algis Downs. Tat and phone conversation with primary nurse Joy. Reason for Consult: Dr. Arbutus Leas requested, via Secure Chat, orders for a modified compression wrap to BLE, something the bedside nurse could perform daily. Wound type: It is unknown if there are wounds on the BLE due to the presence of unna boot type wraps. Joy reports there is NO drainage on either leg wrap. Pressure Injury POA: Yes/No/NA Measurement: Wound bed: Drainage (amount, consistency, odor) none Periwound: Dressing procedure/placement/frequency: Wash legs with soap and water. Pat dry. Apply Sween Moisturizing Ointment (pink and white tube in clean utility). If there are any open wounds, or weeping areas, place Xeroform gauzes Hart [place] [phone] over those. Then, beginning behind the toes and going to just below the knees, spiral wrap kerlix, then 4 inch ace wrap Hart [place] [phone]). Perform daily.   Thank you for the consult.  Discussed plan of care with the bedside nurse.  WOC nurse will not follow at this time.  Please re-consult the WOC team if needed.  Helmut Muster, RN, MSN, CWOCN, CNS-BC, pager [phone]

## 2021-08-21 NOTE — TOC Transition Note (Signed)
Transition of Care [facility]) - CM/SW Discharge Note   Patient Details  Name: Vernon Campbell MRN: [id number] Date of Birth: 05/22/1940  [facility] CM/SW Contact:  Villa Herb, LCSWA Phone Number: 08/21/2021, 10:36 AM   Clinical Narrative:    CSW updated Melissa with [facility] that pt is medically ready for D/C back to their facility today. She is agreeable. CSW provided RN with room and report numbers. CSW to print med necessity to the floor for RN. EMS to be called for transport. CSW attempted to reached pts son listed as contact in chart to provide update, wrong number is listed. TOC signing off.   Final next level of care: Long Term Nursing Home Barriers to Discharge: Barriers Resolved   Patient Goals and CMS Choice Patient states their goals for this hospitalization and ongoing recovery are:: return to facility CMS Medicare.gov Compare Post Acute Care list provided to:: Patient Choice offered to / list presented to : Patient  Discharge Placement              Patient chooses bed at: [facility] Patient to be transferred to facility by: EMS      Discharge Plan and Services                                     Social Determinants of Health (SDOH) Interventions     Readmission Risk Interventions     View : No data to display.

## 2021-08-21 NOTE — Progress Notes (Signed)
Pt in transport  with [facility]

## 2021-08-21 NOTE — Discharge Summary (Signed)
Physician Discharge Summary   Patient: Vernon Campbell MRN: [id number] DOB: 12/01/1940  Admit date:     08/18/2021  Discharge date: 08/21/21  Discharge Physician: Shanon Brow [name]   PCP: System, Provider Not In   Recommendations at discharge:   Please follow up with primary care provider within 1-2 weeks  Please repeat BMP and CBC in one week     Hospital Course:  81 y.o. male with medical history significant of chronic respiratory failure with hypoxia using  3 L nasal cannula supplementation at baseline; hyperlipidemia, hypothyroidism, morbid obesity, chronic diastolic heart failure, restless leg syndrome and more; who presented to the hospital from his nursing home secondary to increased lower extremity swelling, short winded sensation and difficulty laying down flat.  Patient reports symptom has been present for approximately a week and worsening.  Despite adjustment in his oral diuretics regimen as an outpatient he continued to notice an weight gain and more difficulty breathing at bedtime.  Patient reports no fever, no productive cough, no sick contacts, no dysuria, no hematuria, no focal weakness, no chest pain, no hemoptysis or any other complaints.   In the ED BMP was low (probably in the setting of obesity); chest x-ray demonstrating vascular congestion/interstitial edema.  Per records patient approximately 20 pounds above baseline.  IV diuresis initiated and TRH contacted to place patient in the hospital for further evaluation and management.  Assessment and Plan: * Acute on chronic diastolic heart failure [facility]) - Patient will be admitted to telemetry. -5/31 echo--EF 65-70%, mod AS, G1DD -Follow daily weights, low-sodium diet and strict intake and output -ReDS = 38 on 5/31 -continue lasix 60 mg IV bid>>increased to 80 mg bid -continue metolazone -transition to po torsemide 6/2>>60 mg daily -NEG 10 L for the admission;  D/c weight 264  Chronic respiratory failure with hypoxia  (HCC) -Continue oxygen supplementation -Patient uses 3L at baseline.  Hypokalemia Replete Check mag 2.0  Restless leg syndrome - Resumed  Mirapex - norco prn severe pain  Obesity, Class III, BMI 40-49.9 (morbid obesity) (HCC) -Body mass index is 44.29 kg/m. -Low calorie diet, portion control and increase physical activity discussed with patient.  Hypothyroidism -TSH--1.108 -Continue Synthroid  Hyperlipidemia - Continue Lipitor.         Consultants: none Procedures performed: none  Disposition: Skilled nursing facility Diet recommendation:  Cardiac diet DISCHARGE MEDICATION: Allergies as of 08/21/2021   No Known Allergies      Medication List     STOP taking these medications    bacitracin ointment   spironolactone 50 MG tablet Commonly known as: ALDACTONE       TAKE these medications    atorvastatin 20 MG tablet Commonly known as: LIPITOR Take 20 mg by mouth daily.   HYDROcodone-acetaminophen 5-325 MG tablet Commonly known as: NORCO/VICODIN Take 1 tablet by mouth 3 (three) times daily.   ketoconazole 2 % shampoo Commonly known as: NIZORAL Apply 1 application topically 2 (two) times a week. Shampoo on Tuesday. Bathe on Friday.   lactulose 10 GM/15ML solution Commonly known as: CHRONULAC Take 10 g by mouth daily as needed for mild constipation.   levothyroxine 175 MCG tablet Commonly known as: SYNTHROID Take 175 mcg by mouth daily before breakfast.   polyethylene glycol 17 g packet Commonly known as: MIRALAX / GLYCOLAX Take 17 g by mouth daily.   potassium chloride SA 20 MEQ tablet Commonly known as: KLOR-CON M Take 20 mEq by mouth daily.   pramipexole 1 MG tablet Commonly known as: MIRAPEX  Take 1 mg by mouth at bedtime.   sennosides-docusate sodium 8.6-50 MG tablet Commonly known as: SENOKOT-S Take 2 tablets by mouth at bedtime.   torsemide 20 MG tablet Commonly known as: DEMADEX Take 3 tablets (60 mg total) by mouth daily.         Discharge Exam: Filed Weights   08/19/21 0500 08/20/21 0500 08/21/21 0500  Weight: 126.1 kg 123.1 kg 119.7 kg   HEENT:  [place]/AT, No thrush, no icterus CV:  RRR, no rub, no S3, no S4 Lung:  fine bibasilar crackles. No wheeze Abd:  soft/+BS, NT Ext:  trace LE edema, no lymphangitis, no synovitis, no rash   Condition at discharge: stable  The results of significant diagnostics from this hospitalization (including imaging, microbiology, ancillary and laboratory) are listed below for reference.   Imaging Studies: DG Chest Port 1 View  Result Date: 08/18/2021 CLINICAL DATA:  Shortness of breath and LOWER extremity swelling. History of heart failure. EXAM: PORTABLE CHEST 1 VIEW COMPARISON:  12/08/2020 chest radiograph and prior studies FINDINGS: Cardiomegaly with pulmonary vascular congestion noted. Increasing interstitial opacities identified, likely representing edema. LEFT basilar atelectasis/scarring again noted. No pneumothorax or large pleural effusion identified. IMPRESSION: Cardiomegaly with pulmonary vascular congestion. Increasing interstitial opacities likely representing edema. Electronically Signed   By: Margarette Canada M.D.   On: 08/18/2021 12:47   ECHOCARDIOGRAM COMPLETE  Result Date: 08/19/2021    ECHOCARDIOGRAM REPORT   Patient Name:   Vernon Campbell Date of Exam: 08/19/2021 Medical Rec #:  [id number]  Height:       69.0 in Accession #:    [id number] Weight:       278.0 lb Date of Birth:  1940/07/04  BSA:          2.376 m Patient Age:    35 years   BP:           116/59 mmHg Patient Gender: M          HR:           82 bpm. Exam Location:  [facility] Procedure: 2D Echo, Cardiac Doppler, Color Doppler and Intracardiac            Opacification Agent Indications:    CHF  History:        Patient has prior history of Echocardiogram examinations, most                 recent 10/02/2020. CHF; Risk Factors:Dyslipidemia.  Sonographer:    Wenda Low Referring Phys: [place]   Sonographer Comments: Patient is morbidly obese. IMPRESSIONS  1. Left ventricular ejection fraction, by estimation, is 65 to 70%. The left ventricle has normal function. The left ventricle has no regional wall motion abnormalities. There is moderate left ventricular hypertrophy. Left ventricular diastolic parameters are consistent with Grade I diastolic dysfunction (impaired relaxation).  2. Right ventricular systolic function is normal. The right ventricular size is normal. Tricuspid regurgitation signal is inadequate for assessing PA pressure.  3. Left atrial size was mildly dilated.  4. The mitral valve is normal in structure. No evidence of mitral valve regurgitation. No evidence of mitral stenosis.  5. The aortic valve is tricuspid. Aortic valve regurgitation is not visualized. Moderate aortic valve stenosis. FINDINGS  Left Ventricle: Left ventricular ejection fraction, by estimation, is 65 to 70%. The left ventricle has normal function. The left ventricle has no regional wall motion abnormalities. Definity contrast agent was given IV to delineate the left ventricular  endocardial borders. The left  ventricular internal cavity size was normal in size. There is moderate left ventricular hypertrophy. Left ventricular diastolic parameters are consistent with Grade I diastolic dysfunction (impaired relaxation). Normal left  ventricular filling pressure. Right Ventricle: The right ventricular size is normal. Right vetricular wall thickness was not well visualized. Right ventricular systolic function is normal. Tricuspid regurgitation signal is inadequate for assessing PA pressure. Left Atrium: Left atrial size was mildly dilated. Right Atrium: Right atrial size was normal in size. Pericardium: The pericardium was not well visualized. Mitral Valve: The mitral valve is normal in structure. There is mild thickening of the mitral valve leaflet(s). There is mild calcification of the mitral valve leaflet(s). Mild mitral  annular calcification. No evidence of mitral valve regurgitation. No evidence of mitral valve stenosis. MV peak gradient, 4.6 mmHg. The mean mitral valve gradient is 2.0 mmHg. Tricuspid Valve: The tricuspid valve is not well visualized. Tricuspid valve regurgitation is trivial. No evidence of tricuspid stenosis. Aortic Valve: The aortic valve is tricuspid. Aortic valve regurgitation is not visualized. Moderate aortic stenosis is present. Aortic valve mean gradient measures 21.5 mmHg. Aortic valve peak gradient measures 43.7 mmHg. Aortic valve area, by VTI measures 1.39 cm. Pulmonic Valve: The pulmonic valve was not well visualized. Pulmonic valve regurgitation is not visualized. No evidence of pulmonic stenosis. Aorta: The aortic root was not well visualized. Venous: The inferior vena cava was not well visualized. IAS/Shunts: The interatrial septum was not well visualized.  LEFT VENTRICLE PLAX 2D LVIDd:         5.00 cm   Diastology LVIDs:         3.10 cm   LV e' medial:    5.55 cm/s LV PW:         1.30 cm   LV E/e' medial:  14.4 LV IVS:        1.30 cm   LV e' lateral:   7.40 cm/s LVOT diam:     2.00 cm   LV E/e' lateral: 10.8 LV SV:         92 LV SV Index:   39 LVOT Area:     3.14 cm  RIGHT VENTRICLE RV Basal diam:  4.40 cm RV Mid diam:    4.10 cm RV S prime:     15.10 cm/s TAPSE (M-mode): 3.0 cm LEFT ATRIUM             Index        RIGHT ATRIUM           Index LA diam:        4.70 cm 1.98 cm/m   RA Area:     26.00 cm LA Vol (A2C):   89.8 ml 37.79 ml/m  RA Volume:   83.20 ml  35.01 ml/m LA Vol (A4C):   86.6 ml 36.44 ml/m LA Biplane Vol: 88.9 ml 37.41 ml/m  AORTIC VALVE                     PULMONIC VALVE AV Area (Vmax):    1.31 cm      PV Vmax:       0.96 m/s AV Area (Vmean):   1.36 cm      PV Peak grad:  3.7 mmHg AV Area (VTI):     1.39 cm AV Vmax:           330.50 cm/s AV Vmean:          207.500 cm/s AV VTI:  0.659 m AV Peak Grad:      43.7 mmHg AV Mean Grad:      21.5 mmHg LVOT Vmax:          138.00 cm/s LVOT Vmean:        89.600 cm/s LVOT VTI:          0.292 m LVOT/AV VTI ratio: 0.44  AORTA Ao Root diam: 3.20 cm Ao Asc diam:  3.00 cm MITRAL VALVE MV Area (PHT): 2.37 cm    SHUNTS MV Area VTI:   3.13 cm    Systemic VTI:  0.29 m MV Peak grad:  4.6 mmHg    Systemic Diam: 2.00 cm MV Mean grad:  2.0 mmHg MV Vmax:       1.07 m/s MV Vmean:      60.4 cm/s MV Decel Time: 320 msec MV E velocity: 80.10 cm/s MV A velocity: 97.30 cm/s MV E/A ratio:  0.82 Carlyle Dolly MD Electronically signed by Carlyle Dolly MD Signature Date/Time: 08/19/2021/11:48:37 AM    Final     Microbiology: Results for orders placed or performed during the hospital encounter of 10/01/20  Resp Panel by RT-PCR (Flu A&B, Covid) Nasopharyngeal Swab     Status: None   Collection Time: 10/01/20  6:48 PM   Specimen: Nasopharyngeal Swab; Nasopharyngeal(NP) swabs in vial transport medium  Result Value Ref Range Status   SARS Coronavirus 2 by RT PCR NEGATIVE NEGATIVE Final    Comment: (NOTE) SARS-CoV-2 target nucleic acids are NOT DETECTED.  The SARS-CoV-2 RNA is generally detectable in upper respiratory specimens during the acute phase of infection. The lowest concentration of SARS-CoV-2 viral copies this assay can detect is 138 copies/mL. A negative result does not preclude SARS-Cov-2 infection and should not be used as the sole basis for treatment or other patient management decisions. A negative result may occur with  improper specimen collection/handling, submission of specimen other than nasopharyngeal swab, presence of viral mutation(s) within the areas targeted by this assay, and inadequate number of viral copies(<138 copies/mL). A negative result must be combined with clinical observations, patient history, and epidemiological information. The expected result is Negative.  Fact Sheet for Patients:  EntrepreneurPulse.com.au  Fact Sheet for Healthcare Providers:   IncredibleEmployment.be  This test is no t yet approved or cleared by the [place] FDA and  has been authorized for detection and/or diagnosis of SARS-CoV-2 by FDA under an Emergency Use Authorization (EUA). This EUA will remain  in effect (meaning this test can be used) for the duration of the COVID-19 declaration under Section 564(b)(1) of the Act, 21 U.S.C.section 360bbb-3(b)(1), unless the authorization is terminated  or revoked sooner.       Influenza A by PCR NEGATIVE NEGATIVE Final   Influenza B by PCR NEGATIVE NEGATIVE Final    Comment: (NOTE) The Xpert Xpress SARS-CoV-2/FLU/RSV plus assay is intended as an aid in the diagnosis of influenza from Nasopharyngeal swab specimens and should not be used as a sole basis for treatment. Nasal washings and aspirates are unacceptable for Xpert Xpress SARS-CoV-2/FLU/RSV testing.  Fact Sheet for Patients: EntrepreneurPulse.com.au  Fact Sheet for Healthcare Providers: IncredibleEmployment.be  This test is not yet approved or cleared by the [place] FDA and has been authorized for detection and/or diagnosis of SARS-CoV-2 by FDA under an Emergency Use Authorization (EUA). This EUA will remain in effect (meaning this test can be used) for the duration of the COVID-19 declaration under Section 564(b)(1) of the Act, 21 U.S.C. section 360bbb-3(b)(1), unless the  authorization is terminated or revoked.  Performed at [facility], [address] [place], [place] [address]     Labs: CBC: Recent Labs  Lab 08/18/21 1255 08/18/21 1541  WBC 9.0 7.9  HGB 15.1 15.5  HCT 52.2* 52.7*  MCV 108.1* 106.9*  PLT 166 [id number]   Basic Metabolic Panel: Recent Labs  Lab 08/18/21 1255 08/18/21 1541 08/19/21 0502 08/20/21 0516 08/21/21 0549  NA 142  --  142 138 137  K 4.7  --  3.8 3.4* 3.5  CL 93*  --  85* 75* 73*  CO2 43*  --  >45* >45* >45*  GLUCOSE 78  --  106* 124* 149*  BUN  22  --  22 26* 35*  CREATININE 0.98 0.94 1.04 1.26* 1.34*  CALCIUM 8.9  --  9.3 9.3 9.3  MG 1.9 1.9  --   --  2.0  PHOS  --  4.0  --   --   --    Liver Function Tests: No results for input(s): AST, ALT, ALKPHOS, BILITOT, PROT, ALBUMIN in the last 168 hours. CBG: No results for input(s): GLUCAP in the last 168 hours.  Discharge time spent: greater than 30 minutes.  Signed: Orson Eva, MD [facility] 08/21/2021

## 2021-08-21 NOTE — Care Management Important Message (Signed)
Important Message  Patient Details  Name: Vernon Campbell MRN: [id number] Date of Birth: 1941/01/07   Medicare Important Message Given:  Yes     [name] 08/21/2021, 11:06 AM

## 2021-08-21 NOTE — NC FL2 (Signed)
[place] MEDICAID FL2 LEVEL OF CARE SCREENING TOOL     IDENTIFICATION  Patient Name: Vernon Campbell Birthdate: 21-Jan-1941 Sex: male Admission Date (Current Location): 08/18/2021  [facility] and [place] Number:  [employer] and Address:  [facility],  [address], [place] [address]      Provider Number: [id number]  Attending Physician Name and Address:  Catarina Hartshorn, MD  Relative Name and Phone Number:  Izak Anding  [phone]    Current Level of Care: Hospital Recommended Level of Care: Nursing Facility Prior Approval Number:    Date Approved/Denied:   PASRR Number:    Discharge Plan: SNF    Current Diagnoses: Patient Active Problem List   Diagnosis Date Noted   Hypokalemia 08/20/2021   Acute on chronic diastolic heart failure (HCC) 08/18/2021   Restless leg syndrome 08/18/2021   Chronic respiratory failure with hypoxia (HCC) 08/18/2021   Acute exacerbation of CHF (congestive heart failure) (HCC) 10/01/2020   Hyperlipidemia 10/01/2020   Hypothyroidism 10/01/2020   Macrocytic anemia 10/01/2020   AKI (acute kidney injury) (HCC) 10/01/2020   Obesity, Class III, BMI 40-49.9 (morbid obesity) (HCC) 10/01/2020   Acute on chronic respiratory failure with hypoxia (HCC) 10/01/2020   Right thigh pain 10/01/2020   Right groin pain 10/01/2020   Chronic stasis dermatitis 10/01/2020    Orientation RESPIRATION BLADDER Height & Weight     Self, Time, Situation, Place  O2 (3L) External catheter, Incontinent Weight: 263 lb 14.3 oz (119.7 kg) Height:  5\' 9"  (175.3 cm)  BEHAVIORAL SYMPTOMS/MOOD NEUROLOGICAL BOWEL NUTRITION STATUS      Incontinent Diet (Cardiac diet)  AMBULATORY STATUS COMMUNICATION OF NEEDS Skin   Extensive Assist Verbally Normal                       Personal Care Assistance Level of Assistance  Bathing, Feeding, Dressing Bathing Assistance: Maximum assistance Feeding assistance: Independent Dressing Assistance: Maximum  assistance     Functional Limitations Info  Sight, Hearing, Speech Sight Info: Impaired Hearing Info: Adequate Speech Info: Adequate    SPECIAL CARE FACTORS FREQUENCY                       Contractures Contractures Info: Not present    Additional Factors Info  Code Status, Allergies Code Status Info: DNR Allergies Info: NKA           Current Medications (08/21/2021):  This is the current hospital active medication list Current Facility-Administered Medications  Medication Dose Route Frequency Provider Last Rate Last Admin   0.9 %  sodium chloride infusion  250 mL Intravenous PRN 10/21/2021, MD       acetaminophen (TYLENOL) tablet 650 mg  650 mg Oral Q6H PRN Vassie Loll, MD   650 mg at 08/20/21 2121   Or   acetaminophen (TYLENOL) suppository 650 mg  650 mg Rectal Q6H PRN 2122, MD       albuterol (VENTOLIN HFA) 108 (90 Base) MCG/ACT inhaler 2 puff  2 puff Inhalation Q2H PRN Vassie Loll, MD       atorvastatin (LIPITOR) tablet 20 mg  20 mg Oral Daily Eber Hong, MD   20 mg at 08/21/21 10/21/21   cholecalciferol (VITAMIN D3) tablet 1,000 Units  1,000 Units Oral Daily 1540, MD   1,000 Units at 08/21/21 0839   enoxaparin (LOVENOX) injection 40 mg  40 mg Subcutaneous Q24H 10/21/21, MD   40 mg at 08/20/21  1641   HYDROcodone-acetaminophen (NORCO/VICODIN) 5-325 MG per tablet 1 tablet  1 tablet Oral Q6H PRN Catarina Hartshorn, MD   1 tablet at 08/20/21 1713   hydrocortisone 1 % ointment   Topical QID PRN Tat, Onalee Hua, MD   Given at 08/20/21 0430   levothyroxine (SYNTHROID) tablet 100 mcg  100 mcg Oral QAC breakfast Vassie Loll, MD   100 mcg at 08/21/21 0505   ondansetron (ZOFRAN) tablet 4 mg  4 mg Oral Q6H PRN Vassie Loll, MD       Or   ondansetron [facility]) injection 4 mg  4 mg Intravenous Q6H PRN Vassie Loll, MD       potassium chloride SA (KLOR-CON M) CR tablet 20 mEq  20 mEq Oral Daily Vassie Loll, MD   20 mEq at 08/21/21 6812   pramipexole  (MIRAPEX) tablet 1 mg  1 mg Oral QHS Vassie Loll, MD   1 mg at 08/20/21 2121   senna-docusate (Senokot-S) tablet 2 tablet  2 tablet Oral Leonia Reader, MD   2 tablet at 08/20/21 2121   sodium chloride flush (NS) 0.9 % injection 3 mL  3 mL Intravenous Q12H Vassie Loll, MD   3 mL at 08/20/21 2121   sodium chloride flush (NS) 0.9 % injection 3 mL  3 mL Intravenous PRN Vassie Loll, MD       torsemide [facility]) tablet 60 mg  60 mg Oral Daily Tat, Onalee Hua, MD         Discharge Medications: Please see discharge summary for a list of discharge medications.  Relevant Imaging Results:  Relevant Lab Results:   Additional Information SSN: [id number], [place]

## 2021-08-28 ENCOUNTER — Encounter: Payer: Self-pay | Admitting: Urology

## 2021-08-28 ENCOUNTER — Ambulatory Visit (INDEPENDENT_AMBULATORY_CARE_PROVIDER_SITE_OTHER): Payer: Medicare Other | Admitting: Urology

## 2021-08-28 VITALS — BP 106/70 | HR 69

## 2021-08-28 DIAGNOSIS — R350 Frequency of micturition: Secondary | ICD-10-CM

## 2021-08-28 LAB — URINALYSIS, ROUTINE W REFLEX MICROSCOPIC
Bilirubin, UA: NEGATIVE
Glucose, UA: NEGATIVE
Ketones, UA: NEGATIVE
Leukocytes,UA: NEGATIVE
Nitrite, UA: NEGATIVE
Protein,UA: NEGATIVE
RBC, UA: NEGATIVE
Specific Gravity, UA: 1.01 (ref 1.005–1.030)
Urobilinogen, Ur: 2 mg/dL — ABNORMAL HIGH (ref 0.2–1.0)
pH, UA: 7 (ref 5.0–7.5)

## 2021-08-28 LAB — BLADDER SCAN AMB NON-IMAGING: Scan Result: 63

## 2021-08-28 MED ORDER — TAMSULOSIN HCL 0.4 MG PO CAPS: 0.4000 mg | ORAL_CAPSULE | Freq: Every day | ORAL | 11 refills | Status: DC

## 2021-08-28 NOTE — Patient Instructions (Signed)

## 2021-08-28 NOTE — Progress Notes (Signed)
08/28/2021 10:03 AM   Remer Macho 05-20-1940 [id number]  Referring provider: No referring provider defined for this encounter.  Urinary frequency   HPI: Mr Vernon Campbell is a 81yo here for evaluation of urinary frequency. For the past 4 years he has noted increased urinary frequency and urgency. IPSS 25 QOL: 4 on no BPH therapy. Urine stream fair. Nocturia 2x. He has multiple small volume voids. PVR 63cc. He has CHF and is on 60mg  of torsemide daily.    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  Home Medications:  Allergies as of 08/28/2021   No Known Allergies      Medication List        Accurate as of August 28, 2021 10:03 AM. If you have any questions, ask your nurse or doctor.          atorvastatin 20 MG tablet Commonly known as: LIPITOR Take 20 mg  by mouth daily.   HYDROcodone-acetaminophen 5-325 MG tablet Commonly known as: NORCO/VICODIN Take 1 tablet by mouth 3 (three) times daily.   ketoconazole 2 % shampoo Commonly known as: NIZORAL Apply 1 application topically 2 (two) times a week. Shampoo on Tuesday. Bathe on Friday.   lactulose 10 GM/15ML solution Commonly known as: CHRONULAC Take 10 g by mouth daily as needed for mild constipation.   levothyroxine 175 MCG tablet Commonly known as: SYNTHROID Take 175 mcg by mouth daily before breakfast.   polyethylene glycol 17 g packet Commonly known as: MIRALAX / GLYCOLAX Take 17 g by mouth daily.   potassium chloride SA 20 MEQ tablet Commonly known as: KLOR-CON M Take 20 mEq by mouth daily.   pramipexole 1 MG tablet Commonly known as: MIRAPEX Take 1 mg by mouth at bedtime.   sennosides-docusate sodium 8.6-50 MG tablet Commonly known as: SENOKOT-S Take 2 tablets by mouth at bedtime.   torsemide 20 MG tablet Commonly known as: DEMADEX Take 3 tablets (60 mg total) by mouth daily.        Allergies: No Known Allergies  Family History: No family history on file.  Social History:  reports that he has quit smoking. His smoking use included cigarettes. He has never used smokeless tobacco. He reports that he  does not currently use alcohol. He reports that he does not currently use drugs.  ROS: All other review of systems were reviewed and are negative except what is noted above in HPI  Physical Exam: BP 106/70   Pulse 69   Constitutional:  Alert and oriented, No acute distress. HEENT: [place] AT, moist mucus membranes.  Trachea midline, no masses. Cardiovascular: No clubbing, cyanosis, or edema. Respiratory: Normal respiratory effort, no increased work of breathing. GI: Abdomen is soft, nontender, nondistended, no abdominal masses GU: No CVA tenderness.  Lymph: No cervical or inguinal lymphadenopathy. Skin: No rashes, bruises or suspicious lesions. Neurologic: Grossly  intact, no focal deficits, moving all 4 extremities. Psychiatric: Normal mood and affect.  Laboratory Data: Lab Results  Component Value Date   WBC 7.9 08/18/2021   HGB 15.5 08/18/2021   HCT 52.7 (H) 08/18/2021   MCV 106.9 (H) 08/18/2021   PLT 167 08/18/2021    Lab Results  Component Value Date   CREATININE 1.34 (H) 08/21/2021    No results found for: "PSA"  No results found for: "TESTOSTERONE"  No results found for: "HGBA1C"  Urinalysis No results found for: "COLORURINE", "APPEARANCEUR", "LABSPEC", "PHURINE", "GLUCOSEU", "HGBUR", "BILIRUBINUR", "KETONESUR", "PROTEINUR", "UROBILINOGEN", "NITRITE", "LEUKOCYTESUR"  No results found for: "LABMICR", "WBCUA", "RBCUA", "LABEPIT", "MUCUS", "BACTERIA"  Pertinent Imaging:  No results found for this or any previous visit.  No results found for this or any previous visit.  No results found for this or any previous visit.  No results found for this or any previous visit.  No results found for this or any previous visit.  No results found for this or any previous visit.  No results found for this or any previous visit.  No results found for this or any previous visit.   Assessment & Plan:    1. Urinary frequency -we will trial flomax 0.4mg  daily - BLADDER SCAN AMB NON-IMAGING - Urinalysis, Routine w reflex microscopic   No follow-ups on file.  Wilkie Aye, MD  [facility] [place]

## 2021-10-07 NOTE — Progress Notes (Addendum)
Cardiology Office Note    Date:  10/08/2021   ID:  Vernon Campbell, DOB January 22, 1941, MRN [id number]  PCP:  System, Provider Not In  Cardiologist: Jenkins Rouge, MD    Chief Complaint  Patient presents with   Follow-up    Annual Visit    History of Present Illness:    Vernon Campbell is a 81 y.o. male with past medical history of lower extremity edema, aortic stenosis, HTN, HLD, hypothyroidism and COPD who presents to the office today for annual follow-up.  He was last examined by Dr. Johnsie Cancel in 10/2020 and was currently residing at [facility]. He did report having chronic lower extremity edema but reported his breathing had been stable since recent admission for acute hypoxic respiratory failure. It was recommended to plan for a repeat echocardiogram in 1 year for reassessment of his aortic valve. His lower extremity edema was felt to be secondary to venous insufficiency and obesity as compared to CHF.  He was recently admitted to [facility] from 5/30 - 08/21/2021 for an acute CHF exacerbation and repeat echocardiogram showed a preserved EF of 65 to 70% and his aortic stenosis was in a moderate range. He responded well with IV Lasix and Metolazone and was discharged on Torsemide 60 mg daily. He had an overall net output of 10 L during admission and his weight at the time of discharge was 264 lbs.   In talking with the patient today, he reports worsening lower extremity edema and abdominal distention since hospital discharge. His legs are wrapped and dressings are changed twice weekly at SNF per his report. His weight has increased by at least 30 pounds and is listed as 303 lbs on our office scales today but says it is at least 290 lbs on the scales at SNF. He uses a scooter for ambulation and does not use a walker. Denies any specific dyspnea on exertion or chest pain. Does report intermittent orthopnea and PND. Uses supplemental oxygen at nighttime and then as needed during the day.  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 No pertinent surgical history.  Current Medications: Outpatient Medications Prior to Visit  Medication Sig Dispense Refill   atorvastatin (LIPITOR) 20 MG tablet Take 20 mg by  mouth daily.     HYDROcodone-acetaminophen (NORCO/VICODIN) 5-325 MG tablet Take 1 tablet by mouth 3 (three) times daily. 9 tablet 0   ketoconazole (NIZORAL) 2 % shampoo Apply 1 application topically 2 (two) times a week. Shampoo on Tuesday. Bathe on Friday.     lactulose (CHRONULAC) 10 GM/15ML solution Take 10 g by mouth daily as needed for mild constipation.     levothyroxine (SYNTHROID) 175 MCG tablet Take 175 mcg by mouth daily before breakfast.     polyethylene glycol (MIRALAX / GLYCOLAX) 17 g packet Take 17 g by mouth daily.     potassium chloride SA (KLOR-CON) 20 MEQ tablet Take 20 mEq by mouth  daily.     pramipexole (MIRAPEX) 1 MG tablet Take 1 mg by mouth at bedtime.     sennosides-docusate sodium (SENOKOT-S) 8.6-50 MG tablet Take 2 tablets by mouth at bedtime.     spironolactone (ALDACTONE) 50 MG tablet Take 50 mg by mouth daily.     tamsulosin (FLOMAX) 0.4 MG CAPS capsule Take 1 capsule (0.4 mg total) by mouth daily after supper. 30 capsule 11   torsemide (DEMADEX) 20 MG tablet Take 3 tablets (60 mg total) by mouth daily.     No facility-administered medications prior to visit.     Allergies:   Patient has no known allergies.   Social History   Socioeconomic History   Marital status: Single    Spouse name: Not on file   Number of children: Not on file   Years of education: Not on file   Highest education level: Not on file  Occupational History   Not on file  Tobacco Use   Smoking status: Former    Types: Cigarettes   Smokeless tobacco: Never  Vaping Use   Vaping Use: Never used  Substance and Sexual Activity   Alcohol use: Not Currently   Drug use: Not Currently   Sexual activity: Not Currently  Other Topics Concern   Not on file  Social History Narrative   Not on file   Social Determinants of Health   Financial Resource Strain: Not on file  Food Insecurity: Not on file  Transportation Needs: Not on file  Physical Activity: Not on file  Stress: Not on file  Social Connections: Not on file     Family History:  The patient's family history is not on file.   Review of Systems:    Please see the history of present illness.     All other systems reviewed and are otherwise negative except as noted above.   Physical Exam:    VS:  BP 116/72   Pulse (!) 59   Ht 5\' 9"  (1.753 m)   Wt (!) 303 lb 6.4 oz (137.6 kg)   SpO2 92%   BMI 44.80 kg/m    General: Pleasant obese male appearing in no acute distress. Head: Normocephalic, atraumatic. Neck: No carotid bruits. JVD difficult to assess secondary to body habitus.  Lungs: Respirations regular and  unlabored, without wheezes or rales.  Heart: Regular rate and rhythm. No S3 or S4. 2/6 SEM along RUSB.  Abdomen: Appears non-distended. No obvious abdominal masses. Msk:  Strength and tone appear normal for age. No obvious joint deformities or effusions. Extremities: 2+ pitting edema bilaterally. Compression wraps in place.  Neuro: Alert and oriented X 3. Moves all extremities spontaneously. No focal deficits noted. Psych:  Responds to questions appropriately with a normal affect. Skin: No rashes or lesions noted  Wt Readings from Last  3 Encounters:  10/08/21 (!) 303 lb 6.4 oz (137.6 kg)  08/21/21 263 lb 14.3 oz (119.7 kg)  01/14/21 270 lb (122.5 kg)     Studies/Labs Reviewed:   EKG:  EKG is not ordered today.   Recent Labs: 08/18/2021: B Natriuretic Peptide 44.0; Hemoglobin 15.5; Platelets 167; TSH 1.108 08/21/2021: BUN 35; Creatinine, Ser 1.34; Magnesium 2.0; Potassium 3.5; Sodium 137   Lipid Panel No results found for: "CHOL", "TRIG", "HDL", "CHOLHDL", "VLDL", "LDLCALC", "LDLDIRECT"  Additional studies/ records that were reviewed today include:   Echocardiogram: 08/19/2021 IMPRESSIONS     1. Left ventricular ejection fraction, by estimation, is 65 to 70%. The  left ventricle has normal function. The left ventricle has no regional  wall motion abnormalities. There is moderate left ventricular hypertrophy.  Left ventricular diastolic  parameters are consistent with Grade I diastolic dysfunction (impaired  relaxation).   2. Right ventricular systolic function is normal. The right ventricular  size is normal. Tricuspid regurgitation signal is inadequate for assessing  PA pressure.   3. Left atrial size was mildly dilated.   4. The mitral valve is normal in structure. No evidence of mitral valve  regurgitation. No evidence of mitral stenosis.   5. The aortic valve is tricuspid. Aortic valve regurgitation is not  visualized. Moderate aortic valve stenosis.   Assessment:     1. Chronic heart failure with preserved ejection fraction (HCC)   2. Nonrheumatic aortic valve stenosis   3. Essential hypertension   4. Mixed hyperlipidemia      Plan:   In order of problems listed above:  1. HFpEF/Lymphedema - He was recently admitted for an acute CHF exacerbation and repeat echocardiogram showed a preserved EF of 65 to 70% but he did have moderate LVH and Grade 1 DD. - He reports orthopnea, abdominal distension and lower extremity edema and appears volume overloaded by examination today with a weight gain of at least 30 pounds since his hospitalization last month. He has been applying salt to his food and we reviewed the importance of limiting his sodium intake and keeping his fluid intake to < 2 L daily.  - He has been taking Spironolactone 50 mg daily along with Torsemide 60 mg daily but he reports having received intermittent doses of Lasix at SNF as well. Will increase Torsemide to 40 mg twice daily and ask for a repeat BNP and BMET in 7-10 days. I have asked his SNF to report back if weight does not start to improve in the next few days as I suspect he will require Metolazone.  2. Aortic Stenosis - This was in a moderate range by recent echocardiogram in 07/2021. Would anticipate repeat imaging in 1 year. Will cancel his previously scheduled echocardiogram in 10/2021 given one was just obtained during his recent admission.   3. HTN - BP is well-controlled at 116/72 during today's visit. Continue Spironolactone 50 mg daily but will adjust Torsemide as outlined above.  4. HLD - Followed by PCP and no recent FLP available for review. Remains on Atorvastatin 20mg  daily.     Medication Adjustments/Labs and Tests Ordered: Current medicines are reviewed at length with the patient today.  Concerns regarding medicines are outlined above.  Medication changes, Labs and Tests ordered today are listed in the Patient Instructions below. Patient Instructions  Medication  Instructions:  Your physician has recommended you make the following change in your medication:  Increase Torsemide to 40 mg Two Times Daily ( Once in the AM and  again at lunch)   *If you need a refill on your cardiac medications before your next appointment, please call your pharmacy*   Lab Work: Your physician recommends that you return for lab work in: BMET, BNP in 7-10 Days   If you have labs (blood work) drawn today and your tests are completely normal, you will receive your results only by: MyChart Message (if you have MyChart) OR A paper copy in the mail If you have any lab test that is abnormal or we need to change your treatment, we will call you to review the results.   Testing/Procedures: NONE    Follow-Up: At [facility], you and your health needs are our priority.  As part of our continuing mission to provide you with exceptional heart care, we have created designated Provider Care Teams.  These Care Teams include your primary Cardiologist (physician) and Advanced Practice Providers (APPs -  Physician Assistants and Nurse Practitioners) who all work together to provide you with the care you need, when you need it.  We recommend signing up for the patient portal called "MyChart".  Sign up information is provided on this After Visit Summary.  MyChart is used to connect with patients for Virtual Visits (Telemedicine).  Patients are able to view lab/test results, encounter notes, upcoming appointments, etc.  Non-urgent messages can be sent to your provider as well.   To learn more about what you can do with MyChart, go to [email or web address].    Your next appointment:   6 -8 week(s)  The format for your next appointment:   In Person  Provider:   You may see Charlton Haws, MD or one of the following Advanced Practice Providers on your designated Care Team:   Randall An, PA-C  Jacolyn Reedy, PA-C     Other Instructions If weight does not improve call office  for further instructions.   Important Information About Sugar         Signed, Ellsworth Lennox, PA-C  10/08/2021 4:49 PM    [facility] [address] [place], [place] [address] Phone: [phone] Fax: [phone]

## 2021-10-08 ENCOUNTER — Ambulatory Visit (INDEPENDENT_AMBULATORY_CARE_PROVIDER_SITE_OTHER): Payer: Medicare Other | Admitting: Student

## 2021-10-08 ENCOUNTER — Encounter: Payer: Self-pay | Admitting: Student

## 2021-10-08 VITALS — BP 116/72 | HR 59 | Ht 69.0 in | Wt 303.4 lb

## 2021-10-08 DIAGNOSIS — I5032 Chronic diastolic (congestive) heart failure: Secondary | ICD-10-CM

## 2021-10-08 DIAGNOSIS — E782 Mixed hyperlipidemia: Secondary | ICD-10-CM | POA: Diagnosis not present

## 2021-10-08 DIAGNOSIS — I10 Essential (primary) hypertension: Secondary | ICD-10-CM

## 2021-10-08 DIAGNOSIS — I350 Nonrheumatic aortic (valve) stenosis: Secondary | ICD-10-CM | POA: Diagnosis not present

## 2021-10-08 MED ORDER — TORSEMIDE 20 MG PO TABS: 40.0000 mg | ORAL_TABLET | Freq: Two times a day (BID) | ORAL | 3 refills | Status: DC

## 2021-10-08 NOTE — Patient Instructions (Signed)
Medication Instructions:  Your physician has recommended you make the following change in your medication:  Increase Torsemide to 40 mg Two Times Daily ( Once in the AM and again at lunch)   *If you need a refill on your cardiac medications before your next appointment, please call your pharmacy*   Lab Work: Your physician recommends that you return for lab work in: BMET, BNP in 7-10 Days   If you have labs (blood work) drawn today and your tests are completely normal, you will receive your results only by: MyChart Message (if you have MyChart) OR A paper copy in the mail If you have any lab test that is abnormal or we need to change your treatment, we will call you to review the results.   Testing/Procedures: NONE    Follow-Up: At [facility], you and your health needs are our priority.  As part of our continuing mission to provide you with exceptional heart care, we have created designated Provider Care Teams.  These Care Teams include your primary Cardiologist (physician) and Advanced Practice Providers (APPs -  Physician Assistants and Nurse Practitioners) who all work together to provide you with the care you need, when you need it.  We recommend signing up for the patient portal called "MyChart".  Sign up information is provided on this After Visit Summary.  MyChart is used to connect with patients for Virtual Visits (Telemedicine).  Patients are able to view lab/test results, encounter notes, upcoming appointments, etc.  Non-urgent messages can be sent to your provider as well.   To learn more about what you can do with MyChart, go to [email or web address].    Your next appointment:   6 -8 week(s)  The format for your next appointment:   In Person  Provider:   You may see Charlton Haws, MD or one of the following Advanced Practice Providers on your designated Care Team:   Randall An, PA-C  Jacolyn Reedy, PA-C     Other Instructions If weight does not improve  call office for further instructions.   Important Information About Sugar

## 2021-10-14 ENCOUNTER — Ambulatory Visit (INDEPENDENT_AMBULATORY_CARE_PROVIDER_SITE_OTHER): Payer: Medicare Other | Admitting: Physician Assistant

## 2021-10-14 VITALS — BP 138/79 | HR 71

## 2021-10-14 DIAGNOSIS — R600 Localized edema: Secondary | ICD-10-CM

## 2021-10-14 DIAGNOSIS — R350 Frequency of micturition: Secondary | ICD-10-CM

## 2021-10-14 LAB — URINALYSIS, ROUTINE W REFLEX MICROSCOPIC
Bilirubin, UA: NEGATIVE
Glucose, UA: NEGATIVE
Leukocytes,UA: NEGATIVE
Nitrite, UA: NEGATIVE
Protein,UA: NEGATIVE
RBC, UA: NEGATIVE
Specific Gravity, UA: 1.015 (ref 1.005–1.030)
Urobilinogen, Ur: >8 mg/dL — ABNORMAL HIGH (ref 0.2–1.0)
pH, UA: 7 (ref 5.0–7.5)

## 2021-10-14 LAB — BLADDER SCAN AMB NON-IMAGING: Scan Result: 76

## 2021-10-14 NOTE — Progress Notes (Signed)
Assessment: 1. Urinary frequency - Urinalysis, Routine w reflex microscopic - BLADDER SCAN AMB NON-IMAGING  2. Bilateral lower extremity edema    Plan: The patient understands that the level of edema he currently has as well as the increase in torsemide is well and is taking other diuretics will continue to cause frequency until his edema has improved significantly.  He is encouraged to continue Flomax daily and practiced timed voiding to avoid urgency.  His urine is clear today without signs of infection.  No evidence of urinary retention.  He will follow-up in 3 to 4 months for PVR and UA.  Chief Complaint: No chief complaint on file.   HPI: Vernon Campbell is a 81 y.o. male who presents for continued evaluation of urinary frequency. Pt placed on Flomax at last visit one month ago and states he felt some improvement until he developed worsening edema and had increase in diuretic dose. He has also had an increase in urgency. He is able to make it to the toilet or urinal and denies dysuria, burning, gross hematuria. Stream stronger, but still weak on occasion. Nocturia x 3 since last weeks med change. Pt remains at SNF and ambulates with scooter or wheelchair.  Cardiology eval this week indicates increase in Toremide to 60mg  daily due to increase in fluid retention and worsening CHF. IPSS=16, QOL=5 UA= clear today PVR=36ml  08/28/21 Vernon Campbell is a 81yo here for evaluation of urinary frequency. For the past 4 years he has noted increased urinary frequency and urgency. IPSS 25 QOL: 4 on no BPH therapy. Urine stream fair. Nocturia 2x. He has multiple small volume voids. PVR 63cc. He has CHF and is on 60mg  of torsemide daily.  Portions of the above documentation were copied from a prior visit for review purposes only.  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  SH: Social History   Tobacco Use   Smoking status: Former    Types: Cigarettes   Smokeless tobacco: Never  Vaping Use   Vaping Use: Never used  Substance Use Topics   Alcohol use: Not Currently   Drug use: Not Currently    ROS: All other review of systems  were reviewed and are negative except what is noted above in HPI  PE: BP 138/79   Pulse 71  GENERAL APPEARANCE:  Well appearing, well developed, well nourished, NAD HEENT:  Atraumatic, normocephalic NECK:  Supple. Trachea midline ABDOMEN:  Soft, non-tender, no masses EXTREMITIES: Significant bilateral lower extremity edema to above his knees with compression dressings intact. NEUROLOGIC:  Alert and oriented x 3, mobilizing in wheelchair MENTAL STATUS:  appropriate BACK:  Non-tender to palpation, No CVAT SKIN:  Warm, dry, and intact   Results: Laboratory Data: Lab Results  Component Value Date   WBC 7.9  08/18/2021   HGB 15.5 08/18/2021   HCT 52.7 (H) 08/18/2021   MCV 106.9 (H) 08/18/2021   PLT 167 08/18/2021    Lab Results  Component Value Date   CREATININE 1.34 (H) 08/21/2021    Urinalysis    Component Value Date/Time   APPEARANCEUR Clear 08/28/2021 1007   GLUCOSEU Negative 08/28/2021 1007   BILIRUBINUR Negative 08/28/2021 1007   PROTEINUR Negative 08/28/2021 1007   NITRITE Negative 08/28/2021 1007   LEUKOCYTESUR Negative 08/28/2021 1007    Lab Results  Component Value Date   LABMICR Comment 08/28/2021    Pertinent Imaging: No results found for this or any previous visit.  No results found for this or any previous visit.  No results found for this or any previous visit.  No results found for this or any previous visit.  No results found for this or any previous visit.  No results found for this or any previous visit.  No results found for this or any previous visit.  No results found for this or any previous visit.  No results found for this or any previous visit (from the past 24 hour(s)).

## 2021-10-28 ENCOUNTER — Other Ambulatory Visit (HOSPITAL_COMMUNITY): Payer: Medicare Other

## 2021-11-19 ENCOUNTER — Ambulatory Visit: Payer: Medicare Other | Admitting: Student

## 2021-11-24 ENCOUNTER — Encounter: Payer: Self-pay | Admitting: Physician Assistant

## 2021-11-24 NOTE — Progress Notes (Signed)
Cardiology Office Note    Date:  11/26/2021   ID:  Vernon Campbell, DOB Mar 23, 1940, MRN [id number]  PCP:  System, Provider Not In  Cardiologist:  Jenkins Rouge, MD  Electrophysiologist:  None   Chief Complaint: f/u edema  History of Present Illness:   Vernon Campbell is a 81 y.o. 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  He was initially seen by Dr. Johnsie Cancel in 10/2020 for aortic stenosis coming off of a hospitalization in 09/2020 for acute on chronic hypoxic respiratory failure requiring 3-4L home O2 at discharge. IM felt this was due to a/c HFpEF. Dr. Johnsie Cancel referred to this as severe COPD and potential aspiration. He felt the edema was more likely related to obesity and chronic venous disease rather than heart failure. He was not felt to require intervention for his mild-moderate AS at the time. He had another admission 08/2021 with worsening edema, SOB, and orthopnea with weight gain, felt due to a/c diastolic CHF. He was diuresed with IV Lasix and metolazone at the time with dc weight 264. However, his CO2 was significantly elevated at >45 at discharge, though had been mildly elevated at the past. Chloride was also low at 73. Therefore it's possible that the 264 actually reflects an overdiuresis of the intravascular system. 2D Echo 08/19/21 showed EF 65-70%, G1DD, mildly dilated LA, moderate AS, normal RV. At follow-up 10/08/21 he was at 303 on our scale and reported 290 at [facility]. At that time he was on torsemide and spironolactone but also had reported receiving intermittent Lasix dosing as well (?). Torsemide was increased from 60mg  daily to 40mg  BID with repeat BMET/BNP in 7-10 days but not performed in our system.  He  is seen for follow-up today overall doing OK. He feels his cardiac status is improved. Weight declined from 303->288 this visit. His chronic dyspnea is unchanged and states legs at baseline. The facility wraps them twice a week after a shower. He denies any CP, palpitations, or syncope. He does not presently have anyone managing his OSA. He states he sees a pulmonologist but I do not see that in our records. I offered to refer him to manage both his lung disease and sleep apnea, and he declines. He states he eats too much food and drinks too much liquid. He does try to watch his sodium intake.   Labwork independently reviewed: 08/2021 Mg 2.0, K 3.5, CO2 >45, Cl 73, Cr 1.34, BUN 35, TSH wnl, Hgb 15.5, MCV 106.9, plt 167, BNP 44 2022 B12/folate OK, TBili, AST up, ALT wnl  Cardiology Studies:   Studies reviewed are outlined and summarized above. Reports included below if pertinent.   2D echo 08/19/21   1. Left ventricular ejection fraction, by estimation, is 65 to 70%. The  left ventricle has normal function. The left ventricle has no regional  wall motion abnormalities. There is moderate left ventricular hypertrophy.  Left ventricular diastolic  parameters are consistent with Grade I diastolic dysfunction (impaired  relaxation).   2. Right ventricular systolic function is normal. The right ventricular  size is normal. Tricuspid regurgitation signal is inadequate for assessing  PA pressure.   3. Left atrial size was mildly dilated.   4. The mitral valve is normal in structure. No evidence of  mitral valve  regurgitation. No evidence of mitral stenosis.   5. The aortic valve is tricuspid. Aortic valve regurgitation is not  visualized. Moderate aortic valve stenosis.     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 No pertinent surgical history.  Current Medications: Current Meds  Medication Sig   atorvastatin (LIPITOR) 20 MG tablet Take 20 mg by mouth daily.   HYDROcodone-acetaminophen (NORCO/VICODIN) 5-325 MG tablet Take 1 tablet by mouth 3 (three) times daily.   ketoconazole (NIZORAL) 2 % shampoo Apply 1 application topically 2 (two) times a week. Shampoo on Tuesday. Bathe on Friday.   lactulose (CHRONULAC) 10 GM/15ML solution Take 10 g by mouth daily as needed for mild constipation.   levothyroxine (SYNTHROID) 175 MCG tablet Take 175 mcg by mouth daily before breakfast.   polyethylene glycol (MIRALAX / GLYCOLAX) 17 g packet Take 17 g by mouth daily.   potassium chloride SA (KLOR-CON) 20 MEQ tablet Take 20 mEq by mouth daily.   pramipexole (MIRAPEX) 1 MG tablet Take 1 mg by mouth at bedtime.   sennosides-docusate sodium (SENOKOT-S) 8.6-50 MG tablet Take 2 tablets by mouth at bedtime.   spironolactone (ALDACTONE) 50 MG tablet Take 50 mg by mouth daily.    tamsulosin (FLOMAX) 0.4 MG CAPS capsule Take 1 capsule (0.4 mg total) by mouth daily after supper.   Torsemide 40 MG TABS Take 40 mg by mouth 2 (two) times daily. (40mg  tablets per Nursing home [facility])      Allergies:   Patient has no known allergies.   Social History   Socioeconomic History   Marital status: Single    Spouse name: Not on file   Number of children: Not on file   Years of education: Not on file   Highest education level: Not on file  Occupational History   Not on file  Tobacco Use   Smoking status: Former    Types: Cigarettes   Smokeless tobacco: Never  Vaping Use   Vaping Use: Never used  Substance and Sexual Activity   Alcohol use: Not Currently   Drug use: Not Currently   Sexual activity: Not Currently  Other Topics Concern   Not on file  Social  History Narrative   Not on file   Social Determinants of Health   Financial Resource Strain: Not on file  Food Insecurity: Not on file  Transportation Needs: Not on file  Physical Activity: Not on file  Stress: Not on file  Social Connections: Not on file     Family History:  The patient's family history is negative for premature CAD.  ROS:   Please see the history of present illness.  All other systems are reviewed and otherwise negative.    EKG(s)/Additional Labs   EKG:  Not ordered today but reviewed recent tracing 08/18/21 with NSR, RBBB+LPFB (? LAFB on prior - may be lead placement), old inferior infarct, no acute ST chnages  Recent Labs: 08/18/2021: B Natriuretic Peptide 44.0; Hemoglobin 15.5; Platelets 167; TSH 1.108 08/21/2021: BUN 35; Creatinine, Ser 1.34; Magnesium 2.0; Potassium 3.5; Sodium 137  Recent Lipid Panel No results found for: "CHOL", "TRIG", "HDL", "CHOLHDL", "VLDL", "LDLCALC", "LDLDIRECT"  PHYSICAL EXAM:    VS:  BP 116/64   Pulse 84   Ht 5' 9.5" (1.765 m)   Wt 288 lb (130.6 kg)   SpO2 90%   BMI 41.92 kg/m   BMI: Body mass index is 41.92 kg/m.  GEN: Well nourished, well  developed male in no acute distress HEENT: normocephalic, atraumatic Neck: no JVD, carotid bruits, or masses Cardiac: RRR; soft SEM RUSB. No rubs or gallops. Stiff BLE compatible with lymphedema, wrapped Respiratory: diffusely dminished without wheezing, rales or rhonchi. Normal work of breathing on O2 GI: soft, nontender, nondistended, + BS MS: no deformity or atrophy Skin: warm and dry, no rash Neuro:  Alert and Oriented x 3, Strength and sensation are intact, follows commands Psych: euthymic mood, full affect  Wt Readings from Last 3 Encounters:  11/26/21 288 lb (130.6 kg)  10/08/21 (!) 303 lb 6.4 oz (137.6 kg)  08/21/21 263 lb 14.3 oz (119.7 kg)     ASSESSMENT & PLAN:   1. Chronic lymphedema, multifactorial in setting of morbid obesity, venous insufficiency, and potential chronic HFpEF - Dr. Eden Emms previously felt that edema was more likely lymphedema in setting of weight/venous insufficiency. Was admitted with possible HFpEF as well. Overall challenging to discern where one ends and the other begins. BNP has not been helpful as it has been low in setting of morbid obesity. Chronic respiratory failure is also contributing. He seems to be doing OK, with weight downtrended since last visit, on torsemide 40mg  BID + spironolactone. Continue current regimen and recheck BMET today. Reviewed 2g sodium restriction, 2L fluid restriction with patient.  2. Moderate aortic stenosis - continue clinical surveillance and anticipate echo 07/2022 if appropriate at that time, can review and order at next f/u.  3. Essential HTN - BP controlled on present regimen. Await labs as above.  4. COPD with chronic respiratory failure and hypercarbia, OSA - offered to refer to pulmonology locally but he declines at this time. Encouraged him to let 08/2022 know if he changes his mind.  5. RBBB - no symptoms to suggest bradycardia. Follow clinically for now.    Disposition: F/u with Dr. [place] or APP in 4  months.   Medication Adjustments/Labs and Tests Ordered: Current medicines are reviewed at length with the patient today.  Concerns regarding medicines are outlined above. Medication changes, Labs and Tests ordered today are summarized above and listed in the Patient Instructions accessible in Encounters.    Signed, Eden Emms, PA-C  11/26/2021 3:25 PM    [facility] -  [facility] in [place]. [place], [place] [address] Ph: [phone]; Fax [phone]

## 2021-11-26 ENCOUNTER — Ambulatory Visit (INDEPENDENT_AMBULATORY_CARE_PROVIDER_SITE_OTHER): Payer: Medicare Other | Admitting: Physician Assistant

## 2021-11-26 ENCOUNTER — Other Ambulatory Visit (HOSPITAL_COMMUNITY)
Admission: RE | Admit: 2021-11-26 | Discharge: 2021-11-26 | Disposition: A | Discharge status: Home / Self Care | Payer: Medicare Other | Source: Ambulatory Visit | Attending: Physician Assistant | Admitting: Physician Assistant

## 2021-11-26 ENCOUNTER — Encounter: Payer: Self-pay | Admitting: Physician Assistant

## 2021-11-26 VITALS — BP 116/64 | HR 84 | Ht 69.5 in | Wt 288.0 lb

## 2021-11-26 DIAGNOSIS — I5032 Chronic diastolic (congestive) heart failure: Secondary | ICD-10-CM | POA: Insufficient documentation

## 2021-11-26 DIAGNOSIS — I890 Lymphedema, not elsewhere classified: Secondary | ICD-10-CM | POA: Insufficient documentation

## 2021-11-26 DIAGNOSIS — I350 Nonrheumatic aortic (valve) stenosis: Secondary | ICD-10-CM

## 2021-11-26 DIAGNOSIS — J449 Chronic obstructive pulmonary disease, unspecified: Secondary | ICD-10-CM | POA: Insufficient documentation

## 2021-11-26 DIAGNOSIS — I4510 Unspecified right bundle-branch block: Secondary | ICD-10-CM | POA: Insufficient documentation

## 2021-11-26 DIAGNOSIS — I10 Essential (primary) hypertension: Secondary | ICD-10-CM | POA: Insufficient documentation

## 2021-11-26 LAB — BASIC METABOLIC PANEL
Anion gap: 11 (ref 5–15)
BUN: 26 mg/dL — ABNORMAL HIGH (ref 8–23)
CO2: 36 mmol/L — ABNORMAL HIGH (ref 22–32)
Calcium: 8.7 mg/dL — ABNORMAL LOW (ref 8.9–10.3)
Chloride: 91 mmol/L — ABNORMAL LOW (ref 98–111)
Creatinine, Ser: 1.19 mg/dL (ref 0.61–1.24)
GFR, Estimated: >60 mL/min (ref >60)
Glucose, Bld: 109 mg/dL — ABNORMAL HIGH (ref 70–99)
Potassium: 4.4 mmol/L (ref 3.5–5.1)
Sodium: 138 mmol/L (ref 135–145)

## 2021-11-26 NOTE — Patient Instructions (Signed)
Medication Instructions:  Your physician recommends that you continue on your current medications as directed. Please refer to the Current Medication list given to you today.  *If you need a refill on your cardiac medications before your next appointment, please call your pharmacy*   Lab Work: Your physician recommends that you return for lab work in: Today   If you have labs (blood work) drawn today and your tests are completely normal, you will receive your results only by: MyChart Message (if you have MyChart) OR A paper copy in the mail If you have any lab test that is abnormal or we need to change your treatment, we will call you to review the results.   Testing/Procedures: NONE    Follow-Up: At [facility], you and your health needs are our priority.  As part of our continuing mission to provide you with exceptional heart care, we have created designated Provider Care Teams.  These Care Teams include your primary Cardiologist (physician) and Advanced Practice Providers (APPs -  Physician Assistants and Nurse Practitioners) who all work together to provide you with the care you need, when you need it.  We recommend signing up for the patient portal called "MyChart".  Sign up information is provided on this After Visit Summary.  MyChart is used to connect with patients for Virtual Visits (Telemedicine).  Patients are able to view lab/test results, encounter notes, upcoming appointments, etc.  Non-urgent messages can be sent to your provider as well.   To learn more about what you can do with MyChart, go to [email or web address].    Your next appointment:   4 month(s)  The format for your next appointment:   In Person  Provider:   Charlton Haws, MD, Randall An, PA-C, or Ronie Spies, PA-C      Other Instructions Thank you for choosing [facility]!    Important Information About Sugar

## 2021-12-11 ENCOUNTER — Emergency Department (HOSPITAL_COMMUNITY): Payer: Medicare Other

## 2021-12-11 ENCOUNTER — Encounter (HOSPITAL_COMMUNITY): Payer: Self-pay

## 2021-12-11 ENCOUNTER — Other Ambulatory Visit: Payer: Self-pay

## 2021-12-11 ENCOUNTER — Inpatient Hospital Stay (HOSPITAL_COMMUNITY)
Admission: EM | Admit: 2021-12-11 | Discharge: 2021-12-15 | DRG: 291 | Disposition: A | Discharge status: Skilled Nursing Facility | Payer: Medicare Other | Source: Skilled Nursing Facility | Attending: Family Medicine | Admitting: Family Medicine

## 2021-12-11 DIAGNOSIS — R0602 Shortness of breath: Secondary | ICD-10-CM | POA: Diagnosis present

## 2021-12-11 DIAGNOSIS — J9621 Acute and chronic respiratory failure with hypoxia: Secondary | ICD-10-CM | POA: Diagnosis present

## 2021-12-11 DIAGNOSIS — G2581 Restless legs syndrome: Secondary | ICD-10-CM | POA: Diagnosis present

## 2021-12-11 DIAGNOSIS — Z9981 Dependence on supplemental oxygen: Secondary | ICD-10-CM | POA: Diagnosis not present

## 2021-12-11 DIAGNOSIS — I2729 Other secondary pulmonary hypertension: Secondary | ICD-10-CM | POA: Diagnosis present

## 2021-12-11 DIAGNOSIS — I2781 Cor pulmonale (chronic): Secondary | ICD-10-CM | POA: Diagnosis present

## 2021-12-11 DIAGNOSIS — I350 Nonrheumatic aortic (valve) stenosis: Secondary | ICD-10-CM | POA: Diagnosis present

## 2021-12-11 DIAGNOSIS — Z79899 Other long term (current) drug therapy: Secondary | ICD-10-CM

## 2021-12-11 DIAGNOSIS — J441 Chronic obstructive pulmonary disease with (acute) exacerbation: Secondary | ICD-10-CM | POA: Diagnosis present

## 2021-12-11 DIAGNOSIS — J449 Chronic obstructive pulmonary disease, unspecified: Secondary | ICD-10-CM | POA: Diagnosis present

## 2021-12-11 DIAGNOSIS — Z89421 Acquired absence of other right toe(s): Secondary | ICD-10-CM

## 2021-12-11 DIAGNOSIS — E785 Hyperlipidemia, unspecified: Secondary | ICD-10-CM | POA: Diagnosis present

## 2021-12-11 DIAGNOSIS — E66813 Obesity, class 3: Secondary | ICD-10-CM | POA: Diagnosis present

## 2021-12-11 DIAGNOSIS — J9602 Acute respiratory failure with hypercapnia: Principal | ICD-10-CM

## 2021-12-11 DIAGNOSIS — Z79890 Hormone replacement therapy (postmenopausal): Secondary | ICD-10-CM

## 2021-12-11 DIAGNOSIS — I110 Hypertensive heart disease with heart failure: Principal | ICD-10-CM | POA: Diagnosis present

## 2021-12-11 DIAGNOSIS — E039 Hypothyroidism, unspecified: Secondary | ICD-10-CM | POA: Diagnosis present

## 2021-12-11 DIAGNOSIS — I878 Other specified disorders of veins: Secondary | ICD-10-CM | POA: Diagnosis present

## 2021-12-11 DIAGNOSIS — E559 Vitamin D deficiency, unspecified: Secondary | ICD-10-CM | POA: Diagnosis present

## 2021-12-11 DIAGNOSIS — I480 Paroxysmal atrial fibrillation: Secondary | ICD-10-CM | POA: Diagnosis present

## 2021-12-11 DIAGNOSIS — F339 Major depressive disorder, recurrent, unspecified: Secondary | ICD-10-CM | POA: Diagnosis present

## 2021-12-11 DIAGNOSIS — Z20822 Contact with and (suspected) exposure to covid-19: Secondary | ICD-10-CM | POA: Diagnosis present

## 2021-12-11 DIAGNOSIS — Z66 Do not resuscitate: Secondary | ICD-10-CM | POA: Diagnosis present

## 2021-12-11 DIAGNOSIS — J9622 Acute and chronic respiratory failure with hypercapnia: Secondary | ICD-10-CM | POA: Diagnosis present

## 2021-12-11 DIAGNOSIS — I5033 Acute on chronic diastolic (congestive) heart failure: Secondary | ICD-10-CM | POA: Diagnosis present

## 2021-12-11 DIAGNOSIS — Z6841 Body Mass Index (BMI) 40.0 and over, adult: Secondary | ICD-10-CM

## 2021-12-11 DIAGNOSIS — D696 Thrombocytopenia, unspecified: Secondary | ICD-10-CM | POA: Diagnosis present

## 2021-12-11 DIAGNOSIS — Z87891 Personal history of nicotine dependence: Secondary | ICD-10-CM

## 2021-12-11 DIAGNOSIS — I872 Venous insufficiency (chronic) (peripheral): Secondary | ICD-10-CM | POA: Diagnosis present

## 2021-12-11 DIAGNOSIS — E119 Type 2 diabetes mellitus without complications: Secondary | ICD-10-CM | POA: Diagnosis present

## 2021-12-11 DIAGNOSIS — Z5320 Procedure and treatment not carried out because of patient's decision for unspecified reasons: Secondary | ICD-10-CM | POA: Diagnosis present

## 2021-12-11 DIAGNOSIS — J9601 Acute respiratory failure with hypoxia: Principal | ICD-10-CM

## 2021-12-11 DIAGNOSIS — E662 Morbid (severe) obesity with alveolar hypoventilation: Secondary | ICD-10-CM | POA: Diagnosis present

## 2021-12-11 LAB — COMPREHENSIVE METABOLIC PANEL
ALT: 17 U/L (ref 0–44)
AST: 21 U/L (ref 15–41)
Albumin: 3.6 g/dL (ref 3.5–5.0)
Alkaline Phosphatase: 105 U/L (ref 38–126)
Anion gap: 7 (ref 5–15)
BUN: 20 mg/dL (ref 8–23)
CO2: 41 mmol/L — ABNORMAL HIGH (ref 22–32)
Calcium: 8.8 mg/dL — ABNORMAL LOW (ref 8.9–10.3)
Chloride: 91 mmol/L — ABNORMAL LOW (ref 98–111)
Creatinine, Ser: 0.98 mg/dL (ref 0.61–1.24)
GFR, Estimated: >60 mL/min (ref >60)
Glucose, Bld: 116 mg/dL — ABNORMAL HIGH (ref 70–99)
Potassium: 4.2 mmol/L (ref 3.5–5.1)
Sodium: 139 mmol/L (ref 135–145)
Total Bilirubin: 2.2 mg/dL — ABNORMAL HIGH (ref 0.3–1.2)
Total Protein: 6.8 g/dL (ref 6.5–8.1)

## 2021-12-11 LAB — BLOOD GAS, VENOUS
Acid-Base Excess: 18.8 mmol/L — ABNORMAL HIGH (ref 0.0–2.0)
Bicarbonate: 49.1 mmol/L — ABNORMAL HIGH (ref 20.0–28.0)
Drawn by: 66297
O2 Saturation: 98.4 %
Patient temperature: 36.9
pCO2, Ven: 87 mmHg (ref 44–60)
pH, Ven: 7.36 (ref 7.25–7.43)
pO2, Ven: 77 mmHg — ABNORMAL HIGH (ref 32–45)

## 2021-12-11 LAB — TROPONIN I (HIGH SENSITIVITY)
Troponin I (High Sensitivity): 13 ng/L (ref <18)
Troponin I (High Sensitivity): 12 ng/L (ref <18)

## 2021-12-11 LAB — CBC
HCT: 55.6 % — ABNORMAL HIGH (ref 39.0–52.0)
Hemoglobin: 16 g/dL (ref 13.0–17.0)
MCH: 30.8 pg (ref 26.0–34.0)
MCHC: 28.8 g/dL — ABNORMAL LOW (ref 30.0–36.0)
MCV: 106.9 fL — ABNORMAL HIGH (ref 80.0–100.0)
Platelets: 132 10*3/uL — ABNORMAL LOW (ref 150–400)
RBC: 5.2 MIL/uL (ref 4.22–5.81)
RDW: 15.9 % — ABNORMAL HIGH (ref 11.5–15.5)
WBC: 6.8 10*3/uL (ref 4.0–10.5)
nRBC: 0 % (ref 0.0–0.2)

## 2021-12-11 LAB — RESP PANEL BY RT-PCR (FLU A&B, COVID) ARPGX2
Influenza A by PCR: NEGATIVE
Influenza B by PCR: NEGATIVE
SARS Coronavirus 2 by RT PCR: NEGATIVE

## 2021-12-11 LAB — D-DIMER, QUANTITATIVE: D-Dimer, Quant: 0.37 ug/mL-FEU (ref 0.00–0.50)

## 2021-12-11 LAB — BRAIN NATRIURETIC PEPTIDE: B Natriuretic Peptide: 42 pg/mL (ref 0.0–100.0)

## 2021-12-11 LAB — CBG MONITORING, ED: Glucose-Capillary: 205 mg/dL — ABNORMAL HIGH (ref 70–99)

## 2021-12-11 MED ORDER — ALBUTEROL SULFATE (2.5 MG/3ML) 0.083% IN NEBU
2.5000 mg | INHALATION_SOLUTION | RESPIRATORY_TRACT | Status: DC | PRN
  Administered 2021-12-11: 2.5 mg via RESPIRATORY_TRACT
  Filled 2021-12-11: qty 3

## 2021-12-11 MED ORDER — LEVOTHYROXINE SODIUM 50 MCG PO TABS: 175.0000 ug | ORAL_TABLET | Freq: Every day | ORAL | Status: DC

## 2021-12-11 MED ORDER — POTASSIUM CHLORIDE CRYS ER 20 MEQ PO TBCR: 20.0000 meq | EXTENDED_RELEASE_TABLET | Freq: Every day | ORAL | Status: DC

## 2021-12-11 MED ORDER — LEVOTHYROXINE SODIUM 75 MCG PO TABS
175.0000 ug | ORAL_TABLET | Freq: Every day | ORAL | Status: DC
  Administered 2021-12-13 – 2021-12-15 (×3): 175 ug via ORAL
  Filled 2021-12-11 (×3): qty 1

## 2021-12-11 MED ORDER — IPRATROPIUM-ALBUTEROL 0.5-2.5 (3) MG/3ML IN SOLN
3.0000 mL | Freq: Once | RESPIRATORY_TRACT | Status: AC
  Administered 2021-12-11: 3 mL via RESPIRATORY_TRACT
  Filled 2021-12-11: qty 3

## 2021-12-11 MED ORDER — PRAMIPEXOLE DIHYDROCHLORIDE 1 MG PO TABS
1.0000 mg | ORAL_TABLET | Freq: Every day | ORAL | Status: DC
  Administered 2021-12-11 – 2021-12-14 (×4): 1 mg via ORAL
  Filled 2021-12-11: qty 4
  Filled 2021-12-11 (×3): qty 1

## 2021-12-11 MED ORDER — FLUTICASONE FUROATE-VILANTEROL 100-25 MCG/ACT IN AEPB
1.0000 | INHALATION_SPRAY | Freq: Every day | RESPIRATORY_TRACT | Status: DC
  Administered 2021-12-12 – 2021-12-15 (×4): 1 via RESPIRATORY_TRACT
  Filled 2021-12-11 (×2): qty 28

## 2021-12-11 MED ORDER — INSULIN ASPART 100 UNIT/ML IJ SOLN
0.0000 [IU] | Freq: Three times a day (TID) | INTRAMUSCULAR | Status: DC
  Administered 2021-12-12 (×2): 3 [IU] via SUBCUTANEOUS
  Administered 2021-12-12 – 2021-12-13 (×3): 2 [IU] via SUBCUTANEOUS
  Administered 2021-12-14: 5 [IU] via SUBCUTANEOUS
  Administered 2021-12-14: 3 [IU] via SUBCUTANEOUS
  Administered 2021-12-15 (×2): 2 [IU] via SUBCUTANEOUS

## 2021-12-11 MED ORDER — ONDANSETRON HCL 4 MG/2ML IJ SOLN: 4.0000 mg | Freq: Four times a day (QID) | INTRAMUSCULAR | Status: DC | PRN

## 2021-12-11 MED ORDER — TAMSULOSIN HCL 0.4 MG PO CAPS: 0.4000 mg | ORAL_CAPSULE | Freq: Every day | ORAL | Status: DC

## 2021-12-11 MED ORDER — TAMSULOSIN HCL 0.4 MG PO CAPS
0.4000 mg | ORAL_CAPSULE | Freq: Every day | ORAL | Status: DC
  Administered 2021-12-12 – 2021-12-14 (×3): 0.4 mg via ORAL
  Filled 2021-12-11 (×3): qty 1

## 2021-12-11 MED ORDER — PREDNISONE 20 MG PO TABS
40.0000 mg | ORAL_TABLET | Freq: Every day | ORAL | Status: DC
  Administered 2021-12-12: 40 mg via ORAL
  Filled 2021-12-11: qty 2

## 2021-12-11 MED ORDER — POLYETHYLENE GLYCOL 3350 17 G PO PACK
17.0000 g | PACK | Freq: Every day | ORAL | Status: DC
  Administered 2021-12-12: 17 g via ORAL
  Filled 2021-12-11 (×2): qty 1

## 2021-12-11 MED ORDER — HYDROCODONE-ACETAMINOPHEN 5-325 MG PO TABS
1.0000 | ORAL_TABLET | Freq: Three times a day (TID) | ORAL | Status: DC
  Administered 2021-12-11 – 2021-12-15 (×10): 1 via ORAL
  Filled 2021-12-11 (×11): qty 1

## 2021-12-11 MED ORDER — METHYLPREDNISOLONE SODIUM SUCC 125 MG IJ SOLR
60.0000 mg | Freq: Two times a day (BID) | INTRAMUSCULAR | Status: AC
  Administered 2021-12-11 – 2021-12-12 (×2): 60 mg via INTRAVENOUS
  Filled 2021-12-11 (×2): qty 2

## 2021-12-11 MED ORDER — POTASSIUM CHLORIDE CRYS ER 20 MEQ PO TBCR
20.0000 meq | EXTENDED_RELEASE_TABLET | Freq: Every day | ORAL | Status: DC
  Administered 2021-12-12 – 2021-12-15 (×3): 20 meq via ORAL
  Filled 2021-12-11 (×3): qty 1

## 2021-12-11 MED ORDER — INSULIN ASPART 100 UNIT/ML IJ SOLN
0.0000 [IU] | Freq: Every day | INTRAMUSCULAR | Status: DC
  Administered 2021-12-11: 2 [IU] via SUBCUTANEOUS
  Administered 2021-12-13: 3 [IU] via SUBCUTANEOUS

## 2021-12-11 MED ORDER — METHYLPREDNISOLONE SODIUM SUCC 125 MG IJ SOLR
125.0000 mg | Freq: Once | INTRAMUSCULAR | Status: AC
  Administered 2021-12-11: 125 mg via INTRAVENOUS
  Filled 2021-12-11: qty 2

## 2021-12-11 MED ORDER — ONDANSETRON HCL 4 MG PO TABS: 4.0000 mg | ORAL_TABLET | Freq: Four times a day (QID) | ORAL | Status: DC | PRN

## 2021-12-11 MED ORDER — ENOXAPARIN SODIUM 40 MG/0.4ML IJ SOSY
40.0000 mg | PREFILLED_SYRINGE | INTRAMUSCULAR | Status: DC
  Administered 2021-12-11 – 2021-12-14 (×4): 40 mg via SUBCUTANEOUS
  Filled 2021-12-11 (×4): qty 0.4

## 2021-12-11 MED ORDER — TORSEMIDE 20 MG PO TABS
40.0000 mg | ORAL_TABLET | Freq: Two times a day (BID) | ORAL | Status: DC
  Administered 2021-12-11 – 2021-12-12 (×3): 40 mg via ORAL
  Filled 2021-12-11 (×3): qty 2

## 2021-12-11 MED ORDER — SPIRONOLACTONE 25 MG PO TABS
50.0000 mg | ORAL_TABLET | Freq: Every day | ORAL | Status: DC
  Administered 2021-12-12 – 2021-12-15 (×3): 50 mg via ORAL
  Filled 2021-12-11 (×3): qty 2

## 2021-12-11 MED ORDER — POLYETHYLENE GLYCOL 3350 17 G PO PACK: 17.0000 g | PACK | Freq: Every day | ORAL | Status: DC

## 2021-12-11 MED ORDER — SENNOSIDES-DOCUSATE SODIUM 8.6-50 MG PO TABS
2.0000 | ORAL_TABLET | Freq: Every day | ORAL | Status: DC
  Administered 2021-12-11: 2 via ORAL
  Filled 2021-12-11 (×2): qty 2

## 2021-12-11 MED ORDER — ATORVASTATIN CALCIUM 20 MG PO TABS
20.0000 mg | ORAL_TABLET | Freq: Every day | ORAL | Status: DC
  Administered 2021-12-12 – 2021-12-15 (×3): 20 mg via ORAL
  Filled 2021-12-11 (×3): qty 1

## 2021-12-11 NOTE — H&P (Signed)
History and Physical    Patient: Vernon Campbell [id number] DOB: 07/12/1940 DOA: 12/11/2021 DOS: the patient was seen and examined on 12/11/2021 PCP: System, Provider Not In  Patient coming from: SNF  Chief Complaint:  Chief Complaint  Patient presents with   Shortness of Breath   HPI: Vernon Campbell is a 81 y.o. 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, heart failure with preserved EF with diastolic heart failure grade 1 on echocardiogram 05/2021, hypertension, hypothyroidism, chronic venous stasis, paroxysmal atrial fibrillation not on anticoagulation, obstructive sleep apnea, chronic hypokalemia.  Patient seen due to increased respiratory work.  This has been increasing over the past week.  The patient has gotten several doses of IM Lasix throughout the week without improvement.  Due to continued problems with breathing, the patient was referred to the hospital for evaluation.  He does have a mild cough without sputum production.  He denies fevers, chills.  Denies weight gain or worsening of his peripheral edema.  Review of Systems: As mentioned in the history of present illness. All other systems reviewed and are negative. 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 No pertinent surgical history. Social History:  reports that he has quit smoking. His smoking use included cigarettes. He has never used smokeless tobacco. He reports that he does not currently use alcohol. He reports that he does not currently use drugs.  No Known Allergies  No family history on file.  Prior to Admission medications   Medication Sig Start Date End Date Taking? Authorizing Provider  atorvastatin (LIPITOR) 20 MG tablet Take 20 mg by mouth daily.   Yes [provider]  HYDROcodone-acetaminophen (NORCO/VICODIN) 5-325 MG tablet Take 1 tablet by mouth 3 (three) times daily. 08/21/21  Yes Tat, Shanon Brow, MD  ketoconazole (NIZORAL) 2 % shampoo Apply 1 application  topically 2 (two) times a week. Apply to scalp (shampoo) topically every day shift every Tuesday and Friday for tinea versicolor (bath days)   Yes [provider]  lactulose (CHRONULAC) 10 GM/15ML solution Take 15 mLs by mouth daily as needed for mild constipation.   Yes [provider]  levothyroxine (SYNTHROID) 175 MCG tablet Take 175  mcg by mouth daily before breakfast.   Yes [provider]  OXYGEN Inhale 2 L into the lungs continuous.   Yes [provider]  polyethylene glycol (MIRALAX / GLYCOLAX) 17 g packet Take 17 g by mouth daily.   Yes [provider]  potassium chloride SA (KLOR-CON) 20 MEQ tablet Take 20 mEq by mouth daily.   Yes [provider]  pramipexole (MIRAPEX) 1 MG tablet Take 1 mg by mouth at bedtime.    Yes [provider]  sennosides-docusate sodium (SENOKOT-S) 8.6-50 MG tablet Take 2 tablets by mouth at bedtime.   Yes [provider]  spironolactone (ALDACTONE) 50 MG tablet Take 50 mg by mouth daily.   Yes [provider]  tamsulosin (FLOMAX) 0.4 MG CAPS capsule Take 1 capsule (0.4 mg total) by mouth daily after supper. Patient taking differently: Take 0.4 mg by mouth daily. 08/28/21  Yes McKenzie, Candee Furbish, MD  Torsemide 40 MG TABS Take 40 mg by mouth 2 (two) times daily.   Yes [provider]    Physical Exam: Vitals:   12/11/21 1432 12/11/21 1530 12/11/21 1603 12/11/21 1700  BP:    (!) 144/99  Pulse:    87  Resp:    (!) 23  Temp:   98.3 F (36.8 C)   TempSrc:   Oral   SpO2: 94% 92%  (!) 88%  Weight:      Height:       General: Elderly male. Awake and alert and oriented x3. No acute cardiopulmonary distress.  HEENT: Normocephalic atraumatic.  Right and left ears normal in appearance.  Pupils equal, round, reactive to light. Extraocular muscles are intact. Sclerae anicteric and noninjected.  Moist mucosal membranes. No mucosal lesions.  Neck: Neck supple without lymphadenopathy. No carotid bruits. No masses palpated.  Cardiovascular: Regular rate with normal S1-S2 sounds. No murmurs, rubs, gallops auscultated. No JVD.  Respiratory: Good respiratory effort with no wheezes, rales, rhonchi. Lungs clear to auscultation bilaterally.  No accessory muscle use. Abdomen: Soft, nontender, nondistended. Active bowel sounds. No masses or hepatosplenomegaly  Skin: No rashes, lesions, or ulcerations.  Dry, warm to touch. 2+ dorsalis pedis and radial pulses. Musculoskeletal: No calf or leg pain. All major joints not erythematous nontender.  No upper or lower joint deformation.  Good ROM.  No contractures  Psychiatric: Intact judgment and insight. Pleasant and cooperative. Neurologic: No focal neurological deficits. Strength is 5/5 and symmetric in upper and lower  extremities.  Cranial nerves II through XII are grossly intact.  Data Reviewed: Results for orders placed or performed during the hospital encounter of 12/11/21 (from the past 24 hour(s))  Resp Panel by RT-PCR (Flu A&B, Covid) Anterior Nasal Swab     Status: None   Collection Time: 12/11/21 12:28 PM   Specimen: Anterior Nasal Swab  Result Value Ref Range   SARS Coronavirus 2 by RT PCR NEGATIVE NEGATIVE   Influenza A by PCR NEGATIVE NEGATIVE   Influenza B by PCR NEGATIVE NEGATIVE  Comprehensive metabolic panel     Status: Abnormal   Collection Time: 12/11/21  1:05 PM  Result Value Ref Range   Sodium 139 135 - 145 mmol/L   Potassium 4.2 3.5 - 5.1 mmol/L   Chloride 91 (L) 98 - 111 mmol/L   CO2 41 (H) 22 - 32 mmol/L   Glucose, Bld 116 (H) 70 - 99 mg/dL   BUN 20 8 - 23 mg/dL   Creatinine, Ser 0.98 0.61 - 1.24 mg/dL  Calcium 8.8 (L) 8.9 - 10.3 mg/dL   Total Protein 6.8 6.5 - 8.1 g/dL   Albumin 3.6 3.5 - 5.0 g/dL   AST 21 15 - 41 U/L   ALT 17 0 - 44 U/L   Alkaline Phosphatase 105 38 - 126 U/L   Total Bilirubin 2.2 (H) 0.3 - 1.2 mg/dL   GFR, Estimated >60 >60 mL/min   Anion gap 7 5 - 15  Troponin I (High Sensitivity)     Status: None   Collection Time: 12/11/21  1:05 PM  Result Value Ref Range   Troponin I (High Sensitivity) 13 <18 ng/L  Blood gas, venous     Status: Abnormal   Collection Time: 12/11/21  1:05 PM  Result Value Ref Range   pH, Ven 7.36 7.25 - 7.43   pCO2, Ven 87 (HH) 44 - 60 mmHg   pO2, Ven 77 (H) 32 - 45 mmHg   Bicarbonate 49.1 (H) 20.0 - 28.0 mmol/L   Acid-Base Excess 18.8 (H) 0.0 - 2.0 mmol/L   O2 Saturation 98.4 %   Patient temperature 36.9    Collection site BLOOD RIGHT HAND    Drawn by [id number]   D-dimer, quantitative     Status: None   Collection Time: 12/11/21  1:05 PM  Result Value Ref Range   D-Dimer, Quant 0.37 0.00 - 0.50 ug/mL-FEU  CBC     Status: Abnormal   Collection Time: 12/11/21  1:39 PM  Result Value Ref Range   WBC 6.8 4.0 - 10.5 K/uL    RBC 5.20 4.22 - 5.81 MIL/uL   Hemoglobin 16.0 13.0 - 17.0 g/dL   HCT 55.6 (H) 39.0 - 52.0 %   MCV 106.9 (H) 80.0 - 100.0 fL   MCH 30.8 26.0 - 34.0 pg   MCHC 28.8 (L) 30.0 - 36.0 g/dL   RDW 15.9 (H) 11.5 - 15.5 %   Platelets 132 (L) 150 - 400 K/uL   nRBC 0.0 0.0 - 0.2 %  Brain natriuretic peptide     Status: None   Collection Time: 12/11/21  1:39 PM  Result Value Ref Range   B Natriuretic Peptide 42.0 0.0 - 100.0 pg/mL  Troponin I (High Sensitivity)     Status: None   Collection Time: 12/11/21  3:05 PM  Result Value Ref Range   Troponin I (High Sensitivity) 12 <18 ng/L   DG Chest 2 View  Result Date: 12/11/2021 CLINICAL DATA:  Short of breath. EXAM: CHEST - 2 VIEW COMPARISON:  08/18/2021 and older exams. FINDINGS: Mild enlargement of cardiac silhouette is stable. No mediastinal or hilar masses. Vascular congestion and bilateral interstitial prominence is similar to prior exams, extension weighted by low lung volumes. No convincing pleural effusion and no pneumothorax. Skeletal structures are grossly intact. IMPRESSION: 1. No acute cardiopulmonary disease. Findings similar to prior exams with mild cardiomegaly, vascular congestion chronic interstitial prominence, but no overt pulmonary edema Electronically Signed   By: Lajean Manes M.D.   On: 12/11/2021 12:37     Assessment and Plan: No notes have been filed under this hospital service. Service: Hospitalist  Principal Problem:   COPD with acute exacerbation ([place]) Active Problems:   Hypothyroidism   Obesity, Class III, BMI 40-49.9 (morbid obesity) (HCC)   Acute on chronic respiratory failure with hypoxia (HCC)   Chronic stasis dermatitis  Acute on chronic respiratory failure with hypoxia Admit Oxygen support COPD with acute exacerbation Antibiotics: none DuoNeb's every 6 scheduled with albuterol every 2 when necessary Continue  inhaled steroids and LA bronchodilator Solu-Medrol 60 mg IV every 12  hours Mucinex Hypothyroidism Continue levothyroxine Chronic stasis Heart failure with preserved EF Continue diuretics. Appears euvolemic   Advance Care Planning:   Code Status: Prior DNR  Consults: none  Family Communication: none  Severity of Illness: The appropriate patient status for this patient is INPATIENT. Inpatient status is judged to be reasonable and necessary in order to provide the required intensity of service to ensure the patient's safety. The patient's presenting symptoms, physical exam findings, and initial radiographic and laboratory data in the context of their chronic comorbidities is felt to place them at high risk for further clinical deterioration. Furthermore, it is not anticipated that the patient will be medically stable for discharge from the hospital within 2 midnights of admission.   * I certify that at the point of admission it is my clinical judgment that the patient will require inpatient hospital care spanning beyond 2 midnights from the point of admission due to high intensity of service, high risk for further deterioration and high frequency of surveillance required.*  Author: Truett Mainland, DO 12/11/2021 5:53 PM  For on call review [email or web address].

## 2021-12-11 NOTE — ED Triage Notes (Signed)
Pt c/o SOB and hypoxia with a hx of CHF. Pt is on 2 lpm continuous and needs 4 lpm to be above 90%.

## 2021-12-11 NOTE — ED Provider Notes (Signed)
[facility] EMERGENCY DEPARTMENT Provider Note  CSN: [id number] Arrival date & time: 12/11/21 1127  Chief Complaint(s) Shortness of Breath  HPI Vernon Campbell is a 81 y.o. male with history of COPD, obesity, right heart failure and pulmonary hypertension presenting to the emergency department with shortness of breath.  Patient reports shortness of breath over the past week which has worsened.  He denies any other significant change in his chronic symptoms, denies any chest pain, fevers or chills, denies productive cough, reports some chronic dry cough.  He denies any abdominal pain.  Reports leg swelling which is chronic and not necessarily worse.  He presents from his rehab facility due to increased oxygen requirement of 2 to 4 L he was receiving IM Lasix at his facility without significant improvement.   Past Medical History Past Medical History:  Diagnosis Date  . Abnormal weight gain   . Abnormality of gait   . Acquired absence of other right toe(s) ([place])   . Age-related physical debility   . Aortic stenosis   . Carpal tunnel syndrome, right   . Chronic cor pulmonale (HCC)   . Chronic diastolic heart failure ([place])   . Chronic respiratory failure with hypoxia ([place])   . Combined forms of age-related cataract, bilateral   . Constipation, chronic   . Dermatochalasis of eyelid   . Extreme obesity with alveolar hypoventilation (HCC)   . History of tobacco use   . Hyperlipidemia   . Hypermetropia, bilateral   . Hypertensive heart disease with congestive heart failure ([address])   . Hypokalemia   . Hypothyroidism, adult   . Ileus ([place])   . Leg pain, diffuse   . Lymphedema   . Muscle weakness (generalized)   . Obstructive sleep apnea syndrome   . Osteoarthritis, unspecified osteoarthritis type, unspecified site   . Other seborrheic keratosis   . Paroxysmal atrial fibrillation (HCC)   . Peripheral venous insufficiency   . Pulmonary hypertension ([place])   . 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 Restless leg syndrome   . Vitamin D deficiency, unspecified   . Vitreous degeneration of left eye    Patient Active Problem List   Diagnosis Date Noted  . SOB (shortness of breath) 12/11/2021  . Hypokalemia 08/20/2021  . Acute on chronic diastolic heart failure ([place]) 08/18/2021  . Restless leg syndrome 08/18/2021  . Chronic respiratory failure with hypoxia ([place]) 08/18/2021  . Acute exacerbation of CHF (congestive heart failure) ([place]) 10/01/2020  . Hyperlipidemia 10/01/2020  . Hypothyroidism 10/01/2020  . Macrocytic anemia 10/01/2020  . AKI (acute kidney injury) ([place]) 10/01/2020  . Obesity, Class III, BMI 40-49.9 (morbid obesity) ([place]) 10/01/2020  . Acute on chronic respiratory failure with hypoxia ([place]) 10/01/2020  . Right thigh pain 10/01/2020  . Right groin pain 10/01/2020  . Chronic stasis dermatitis 10/01/2020   Home Medication(s) Prior to Admission medications   Medication Sig Start Date End Date Taking? Authorizing Provider  atorvastatin (LIPITOR) 20 MG tablet Take 20 mg by mouth daily.    [provider]  HYDROcodone-acetaminophen (NORCO/VICODIN) 5-325 MG tablet Take 1 tablet by mouth 3 (three) times daily. 08/21/21   Orson Eva, MD  ketoconazole (NIZORAL) 2 % shampoo Apply 1 application topically 2 (two) times a week. Shampoo on Tuesday. Bathe on Friday.    [provider]  lactulose (CHRONULAC) 10 GM/15ML solution Take 10 g by mouth daily as needed for mild constipation.    [provider]  levothyroxine (SYNTHROID) 175 MCG tablet Take 175  mcg by mouth daily before breakfast.    [provider]  polyethylene glycol (MIRALAX / GLYCOLAX) 17 g packet Take 17 g by mouth daily.    [provider]  potassium chloride SA (KLOR-CON) 20 MEQ tablet Take 20 mEq by mouth daily.    [provider]  pramipexole (MIRAPEX) 1 MG tablet Take 1 mg by mouth at bedtime.    [provider]  sennosides-docusate sodium (SENOKOT-S)  8.6-50 MG tablet Take 2 tablets by mouth at bedtime.    [provider]  spironolactone (ALDACTONE) 50 MG tablet Take 50 mg by mouth daily.    [provider]  tamsulosin (FLOMAX) 0.4 MG CAPS capsule Take 1 capsule (0.4 mg total) by mouth daily after supper. 08/28/21   McKenzie, Mardene Celeste, MD  Torsemide 40 MG TABS Take 40 mg by mouth 2 (two) times daily.    [provider]                                                                                                                                    Past Surgical History History reviewed. No pertinent surgical history. Family History No family history on file.  Social History Social History   Tobacco Use  . Smoking status: Former    Types: Cigarettes  . Smokeless tobacco: Never  Vaping Use  . Vaping Use: Never used  Substance Use Topics  . Alcohol use: Not Currently  . Drug use: Not Currently   Allergies Patient has no known allergies.  Review of Systems Review of Systems  All other systems reviewed and are negative.   Physical Exam Vital Signs  I have reviewed the triage vital signs BP 118/67   Pulse 89   Temp 98.4 F (36.9 C) (Oral)   Resp (!) 23   Ht 5' 9.5" (1.765 m)   Wt 130.6 kg   SpO2 92%   BMI 41.92 kg/m  Physical Exam Vitals and nursing note reviewed.  Constitutional:      General: He is not in acute distress.    Appearance: Normal appearance. He is obese.  HENT:     Mouth/Throat:     Mouth: Mucous membranes are moist.  Eyes:     Conjunctiva/sclera: Conjunctivae normal.  Cardiovascular:     Rate and Rhythm: Normal rate and regular rhythm.  Pulmonary:     Effort: Pulmonary effort is normal. No respiratory distress.     Breath sounds: Examination of the right-lower field reveals rales. Examination of the left-lower field reveals rales. Rales present.  Abdominal:     General: Abdomen is flat.     Palpations: Abdomen is soft.     Tenderness: There is no abdominal  tenderness.  Musculoskeletal:     Right lower leg: Edema present.     Left lower leg: Edema present.  Skin:    General: Skin is warm and dry.     Capillary  Refill: Capillary refill takes less than 2 seconds.  Neurological:     Mental Status: He is alert and oriented to person, place, and time. Mental status is at baseline.  Psychiatric:        Mood and Affect: Mood normal.        Behavior: Behavior normal.     ED Results and Treatments Labs (all labs ordered are listed, but only abnormal results are displayed) Labs Reviewed  COMPREHENSIVE METABOLIC PANEL - Abnormal; Notable for the following components:      Result Value   Chloride 91 (*)    CO2 41 (*)    Glucose, Bld 116 (*)    Calcium 8.8 (*)    Total Bilirubin 2.2 (*)    All other components within normal limits  BLOOD GAS, VENOUS - Abnormal; Notable for the following components:   pCO2, Ven 87 (*)    pO2, Ven 77 (*)    Bicarbonate 49.1 (*)    Acid-Base Excess 18.8 (*)    All other components within normal limits  CBC - Abnormal; Notable for the following components:   HCT 55.6 (*)    MCV 106.9 (*)    MCHC 28.8 (*)    RDW 15.9 (*)    Platelets 132 (*)    All other components within normal limits  RESP PANEL BY RT-PCR (FLU A&B, COVID) ARPGX2  D-DIMER, QUANTITATIVE  BRAIN NATRIURETIC PEPTIDE  TROPONIN I (HIGH SENSITIVITY)  TROPONIN I (HIGH SENSITIVITY)                                                                                                                          Radiology DG Chest 2 View  Result Date: 12/11/2021 CLINICAL DATA:  Short of breath. EXAM: CHEST - 2 VIEW COMPARISON:  08/18/2021 and older exams. FINDINGS: Mild enlargement of cardiac silhouette is stable. No mediastinal or hilar masses. Vascular congestion and bilateral interstitial prominence is similar to prior exams, extension weighted by low lung volumes. No convincing pleural effusion and no pneumothorax. Skeletal structures are grossly  intact. IMPRESSION: 1. No acute cardiopulmonary disease. Findings similar to prior exams with mild cardiomegaly, vascular congestion chronic interstitial prominence, but no overt pulmonary edema Electronically Signed   By: Amie Portland M.D.   On: 12/11/2021 12:37    Pertinent labs & imaging results that were available during my care of the patient were reviewed by me and considered in my medical decision making (see MDM for details).  Medications Ordered in ED Medications  methylPREDNISolone sodium succinate (SOLU-MEDROL) 125 mg/2 mL injection 125 mg (has no administration in time range)  ipratropium-albuterol (DUONEB) 0.5-2.5 (3) MG/3ML nebulizer solution 3 mL (3 mLs Nebulization Given 12/11/21 1432)  ipratropium-albuterol (DUONEB) 0.5-2.5 (3) MG/3ML nebulizer solution 3 mL (3 mLs Nebulization Given 12/11/21 1530)  Procedures Procedures  (including critical care time)  Medical Decision Making / ED Course   MDM:  81 year old male presenting to the emergency department shortness of breath.  Patient chronically ill-appearing but in no acute distress.  Exam notable for bibasilar crackles.  Patient is now on 4 L of oxygen.  Differential includes pneumonia, volume overload, less likely CHF as patient had recent echo which did not show signs of decreased left ventricular function, patient does have history of pulmonary hypertension so this could be cause of his worsening symptoms.  No sign of pneumothorax on exam.  No wheezing to suggest COPD exacerbation but will check VBG.  Will reassess.  We will also obtain D-dimer to evaluate for pulmonary embolism.  Clinical Course as of 12/11/21 1539  Fri Dec 11, 2021  1533 Discussed with hospitalist who has admitted the patient.  [WS]  1538 Patient feels improved after breathing treatment.  Suspect COPD exacerbation.   Patient has elevated PCO2 with normal pH, suspect chronic hypercapnic respiratory failure.  Patient mentating well, will defer BiPAP.  Will give IV steroids.  Discussed with the hospitalist will admit the patient for COPD exacerbation given increased hypoxia. [WS]    Clinical Course User Index [WS] Lonell Grandchild, MD     Additional history obtained: -Additional history obtained from ems -External records from outside source obtained and reviewed including: Chart review including previous notes, labs, imaging, consultation notes   Lab Tests: -I ordered, reviewed, and interpreted labs.   The pertinent results include:   Labs Reviewed  COMPREHENSIVE METABOLIC PANEL - Abnormal; Notable for the following components:      Result Value   Chloride 91 (*)    CO2 41 (*)    Glucose, Bld 116 (*)    Calcium 8.8 (*)    Total Bilirubin 2.2 (*)    All other components within normal limits  BLOOD GAS, VENOUS - Abnormal; Notable for the following components:   pCO2, Ven 87 (*)    pO2, Ven 77 (*)    Bicarbonate 49.1 (*)    Acid-Base Excess 18.8 (*)    All other components within normal limits  CBC - Abnormal; Notable for the following components:   HCT 55.6 (*)    MCV 106.9 (*)    MCHC 28.8 (*)    RDW 15.9 (*)    Platelets 132 (*)    All other components within normal limits  RESP PANEL BY RT-PCR (FLU A&B, COVID) ARPGX2  D-DIMER, QUANTITATIVE  BRAIN NATRIURETIC PEPTIDE  TROPONIN I (HIGH SENSITIVITY)  TROPONIN I (HIGH SENSITIVITY)      EKG   EKG Interpretation  Date/Time:  Friday December 11 2021 12:22:25 EDT Ventricular Rate:  83 PR Interval:  167 QRS Duration: 143 QT Interval:  395 QTC Calculation: 465 R Axis:   163 Text Interpretation: Sinus rhythm RBBB and LPFB Inferior infarct, old Confirmed by Alvino Blood ([id number]) on 12/11/2021 1:37:15 PM         Imaging Studies ordered: I ordered imaging studies including CXR On my interpretation imaging demonstrates no  pulmonary edema, or clear pneumonia I independently visualized and interpreted imaging. I agree with the radiologist interpretation   Medicines ordered and prescription drug management: Meds ordered this encounter  Medications  . ipratropium-albuterol (DUONEB) 0.5-2.5 (3) MG/3ML nebulizer solution 3 mL  . methylPREDNISolone sodium succinate (SOLU-MEDROL) 125 mg/2 mL injection 125 mg  . ipratropium-albuterol (DUONEB) 0.5-2.5 (3) MG/3ML nebulizer solution 3 mL    -I have reviewed the patients home  medicines and have made adjustments as needed   Consultations Obtained: I requested consultation with the hospitalist,  and discussed lab and imaging findings as well as pertinent plan - they recommend: admission   Cardiac Monitoring: The patient was maintained on a cardiac monitor.  I personally viewed and interpreted the cardiac monitored which showed an underlying rhythm of: NSR  Social Determinants of Health:  Factors impacting patients care include: former smoker and obesity   Reevaluation: After the interventions noted above, I reevaluated the patient and found that they have improved  Co morbidities that complicate the patient evaluation . Past Medical History:  Diagnosis Date  . Abnormal weight gain   . Abnormality of gait   . 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 Age-related physical debility   . Aortic stenosis   . Carpal tunnel syndrome, right   . Chronic cor pulmonale (HCC)   . Chronic diastolic heart failure (HCC)   . Chronic respiratory failure with hypoxia (HCC)   . Combined forms of age-related cataract, bilateral   . Constipation, chronic   . Dermatochalasis of eyelid   . Extreme obesity with alveolar hypoventilation (HCC)   . History of tobacco use   . Hyperlipidemia   . Hypermetropia, bilateral   . 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)   . Hypokalemia   . Hypothyroidism, adult   . Ileus (HCC)   . Leg pain, diffuse   .  Lymphedema   . Muscle weakness (generalized)   . Obstructive sleep apnea syndrome   . Osteoarthritis, unspecified osteoarthritis type, unspecified site   . Other seborrheic keratosis   . Paroxysmal atrial fibrillation (HCC)   . Peripheral venous insufficiency   . Pulmonary hypertension (HCC)   . Recurrent major depression (HCC)   . Restless leg syndrome   . Vitamin D deficiency, unspecified   . Vitreous degeneration of left eye       Dispostion: Admit    Final Clinical Impression(s) / ED Diagnoses Final diagnoses:  Acute respiratory failure with hypoxia and hypercapnia (HCC)  COPD exacerbation (HCC)     This chart was dictated using voice recognition software.  Despite best efforts to proofread,  errors can occur which can change the documentation meaning.    Lonell GrandchildScheving, Emri Sample L, MD 12/11/21 1539

## 2021-12-12 DIAGNOSIS — J441 Chronic obstructive pulmonary disease with (acute) exacerbation: Secondary | ICD-10-CM | POA: Diagnosis not present

## 2021-12-12 LAB — CBC
HCT: 52.9 % — ABNORMAL HIGH (ref 39.0–52.0)
Hemoglobin: 15.3 g/dL (ref 13.0–17.0)
MCH: 31.1 pg (ref 26.0–34.0)
MCHC: 28.9 g/dL — ABNORMAL LOW (ref 30.0–36.0)
MCV: 107.5 fL — ABNORMAL HIGH (ref 80.0–100.0)
Platelets: 128 10*3/uL — ABNORMAL LOW (ref 150–400)
RBC: 4.92 MIL/uL (ref 4.22–5.81)
RDW: 15.4 % (ref 11.5–15.5)
WBC: 4.1 10*3/uL (ref 4.0–10.5)
nRBC: 0 % (ref 0.0–0.2)

## 2021-12-12 LAB — BASIC METABOLIC PANEL
Anion gap: 6 (ref 5–15)
BUN: 23 mg/dL (ref 8–23)
CO2: 43 mmol/L — ABNORMAL HIGH (ref 22–32)
Calcium: 8.7 mg/dL — ABNORMAL LOW (ref 8.9–10.3)
Chloride: 92 mmol/L — ABNORMAL LOW (ref 98–111)
Creatinine, Ser: 1.08 mg/dL (ref 0.61–1.24)
GFR, Estimated: >60 mL/min (ref >60)
Glucose, Bld: 159 mg/dL — ABNORMAL HIGH (ref 70–99)
Potassium: 4.4 mmol/L (ref 3.5–5.1)
Sodium: 141 mmol/L (ref 135–145)

## 2021-12-12 LAB — HEMOGLOBIN A1C
Hgb A1c MFr Bld: 5.5 % (ref 4.8–5.6)
Mean Plasma Glucose: 111.15 mg/dL

## 2021-12-12 LAB — GLUCOSE, CAPILLARY
Glucose-Capillary: 150 mg/dL — ABNORMAL HIGH (ref 70–99)
Glucose-Capillary: 161 mg/dL — ABNORMAL HIGH (ref 70–99)
Glucose-Capillary: 158 mg/dL — ABNORMAL HIGH (ref 70–99)
Glucose-Capillary: 140 mg/dL — ABNORMAL HIGH (ref 70–99)

## 2021-12-12 LAB — MRSA NEXT GEN BY PCR, NASAL: MRSA by PCR Next Gen: NOT DETECTED

## 2021-12-12 MED ORDER — FUROSEMIDE 10 MG/ML IJ SOLN
60.0000 mg | Freq: Two times a day (BID) | INTRAMUSCULAR | Status: DC
  Administered 2021-12-12 – 2021-12-15 (×6): 60 mg via INTRAVENOUS
  Filled 2021-12-12 (×6): qty 6

## 2021-12-12 NOTE — Progress Notes (Signed)
PROGRESS NOTE     Vernon Campbell, is a 81 y.o. male, DOB - 12-08-1940, QN:[id number]  Admit date - 12/11/2021   Admitting Physician Truett Mainland, DO  Outpatient Primary MD for the patient is System, Provider Not In  LOS - 1  Chief Complaint  Patient presents with   Shortness of Breath        Brief Narrative:   81 y.o. 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 Aldactone -Daily weights and fluid input and output monitoring  2) acute COPD exacerbation--- continue steroids and bronchodilators as ordered  3) acute on chronic hypoxic respiratory failure--- secondary to #1 and #2 above -Manage as above  4)Thrombocytopenia----bleeding concerns watch closely specially while on Lovenox  5)DM2-A1c 5.5 reflecting excellent diabetic control PTA Use Novolog/Humalog Sliding scale insulin with Accu-Cheks/Fingersticks as ordered   6)Hypothyroidism--continue levothyroxine   Disposition/Need for in-Hospital Stay- patient unable to be discharged at this time due to --acute on chronic hypoxic respiratory failure requiring IV diuresis and steroids*  Status is: Inpatient   Disposition: The patient is from: SNF              Anticipated d/c is to: SNF              Anticipated d/c date is: 2 days              Patient currently  is not medically stable to d/c. Barriers: Not Clinically Stable-   Code Status :  -  Code Status: DNR   Family Communication:    NA (patient is alert, awake and coherent)   DVT Prophylaxis  :   - SCDs   enoxaparin (LOVENOX) injection 40 mg Start: 12/11/21 2000   Lab Results  Component Value Date   PLT 128 (L) 12/12/2021    Inpatient Medications  Scheduled Meds:  atorvastatin  20 mg Oral Daily   enoxaparin (LOVENOX) injection  40 mg Subcutaneous Q24H   fluticasone furoate-vilanterol  1 puff Inhalation Daily   HYDROcodone-acetaminophen  1 tablet Oral TID   insulin aspart  0-15 Units Subcutaneous TID WC   insulin aspart  0-5 Units Subcutaneous QHS   levothyroxine  175 mcg Oral QAC breakfast   polyethylene glycol  17 g Oral Daily   potassium chloride SA  20 mEq Oral Daily   pramipexole  1 mg Oral QHS   predniSONE  40 mg Oral Q breakfast   senna-docusate  2 tablet Oral QHS   spironolactone  50 mg Oral Daily   tamsulosin  0.4 mg Oral QPC supper   torsemide  40 mg Oral BID   Continuous Infusions: PRN Meds:.albuterol, ondansetron **OR** ondansetron (ZOFRAN) IV   Anti-infectives (From admission, onward)    None         Subjective: Quenton Fetter today has no fevers, no emesis,  No chest pain,   -Dyspnea and cough is not worse -Voiding okay  Objective: Vitals:   12/12/21  0035 12/12/21 0415 12/12/21 0758 12/12/21 1233  BP:  (!) 106/59  (!) 96/59  Pulse:  81  62  Resp:  17  20  Temp:  98 F (36.7 C)  98.3 F (36.8 C)  TempSrc:  Oral  Oral  SpO2:  92% 91% 95%  Weight: 129.2 kg     Height: 5\' 10"  (1.778 m)       Intake/Output Summary (Last 24 hours) at 12/12/2021 1913 Last data filed at 12/12/2021 1700 Gross per 24 hour  Intake 480 ml  Output 650 ml  Net -170 ml   Filed Weights   12/11/21 1156 12/12/21 0035  Weight: 130.6 kg 129.2 kg    Physical Exam  Gen:- Awake Alert, no acute distress HEENT:- [place].AT, No sclera icterus Neck-Supple Neck,No JVD,.   Lungs-diminished in bases, no wheezing  CV- S1, S2 normal, regular  Abd-  +ve B.Sounds, Abd Soft, No tenderness,    Extremity/Skin:-Significant lower extremity edema with venous stasis, pedal pulses present  Psych-affect is appropriate, oriented x3 Neuro-no new focal deficits, no tremors  Data Reviewed: I have personally reviewed following labs and imaging studies  CBC: Recent Labs  Lab 12/11/21 1339 12/12/21 0637  WBC 6.8 4.1  HGB 16.0 15.3  HCT 55.6* 52.9*  MCV 106.9* 107.5*  PLT 132* 128*   Basic Metabolic Panel: Recent Labs  Lab 12/11/21 1305 12/12/21 0637  NA 139 141  K 4.2 4.4  CL 91* 92*  CO2 41* 43*  GLUCOSE 116* 159*  BUN 20 23  CREATININE 0.98 1.08  CALCIUM 8.8* 8.7*   GFR: Estimated Creatinine Clearance: 70 mL/min (by C-G formula based on SCr of 1.08 mg/dL). Liver Function Tests: Recent Labs  Lab 12/11/21 1305  AST 21  ALT 17  ALKPHOS 105  BILITOT 2.2*  PROT 6.8  ALBUMIN 3.6   Recent Labs    12/12/21 0637  HGBA1C 5.5   Recent Results (from the past 240 hour(s))  Resp Panel by RT-PCR (Flu A&B, Covid) Anterior Nasal Swab     Status: None   Collection Time: 12/11/21 12:28 PM   Specimen: Anterior Nasal Swab  Result Value Ref Range Status   SARS Coronavirus 2 by RT PCR NEGATIVE NEGATIVE Final    Comment: (NOTE) SARS-CoV-2 target nucleic acids are NOT DETECTED.  The SARS-CoV-2 RNA is generally detectable in upper respiratory specimens during the acute phase of infection. The lowest concentration of SARS-CoV-2 viral copies this assay can detect is 138 copies/mL. A negative result does not preclude SARS-Cov-2 infection and should not be used as the sole basis for treatment or other patient management decisions. A negative result may occur with  improper specimen collection/handling, submission of specimen other than nasopharyngeal swab, presence of viral mutation(s) within the areas targeted by this assay, and inadequate number of  viral copies(<138 copies/mL). A negative result must be combined with clinical observations, patient history, and epidemiological information. The expected result is Negative.  Fact Sheet for Patients:  BloggerCourse.com  Fact Sheet for Healthcare Providers:  SeriousBroker.it  This test is no t yet approved or cleared by the [place] FDA and  has been authorized for detection and/or diagnosis of SARS-CoV-2 by FDA under an Emergency Use Authorization (EUA). This EUA will remain  in effect (meaning this test can be used) for the duration of the COVID-19 declaration under Section 564(b)(1) of the Act, 21 U.S.C.section 360bbb-3(b)(1), unless the authorization is terminated  or revoked sooner.       Influenza A by  PCR NEGATIVE NEGATIVE Final   Influenza B by PCR NEGATIVE NEGATIVE Final    Comment: (NOTE) The Xpert Xpress SARS-CoV-2/FLU/RSV plus assay is intended as an aid in the diagnosis of influenza from Nasopharyngeal swab specimens and should not be used as a sole basis for treatment. Nasal washings and aspirates are unacceptable for Xpert Xpress SARS-CoV-2/FLU/RSV testing.  Fact Sheet for Patients: EntrepreneurPulse.com.au  Fact Sheet for Healthcare Providers: IncredibleEmployment.be  This test is not yet approved or cleared by the [place] FDA and has been authorized for detection and/or diagnosis of SARS-CoV-2 by FDA under an Emergency Use Authorization (EUA). This EUA will remain in effect (meaning this test can be used) for the duration of the COVID-19 declaration under Section 564(b)(1) of the Act, 21 U.S.C. section 360bbb-3(b)(1), unless the authorization is terminated or revoked.  Performed at [facility], [address], [place], [place] [address]   MRSA Next Gen by PCR, Nasal     Status: None   Collection Time: 12/12/21  1:52 AM   Specimen: Nasal Mucosa; Nasal Swab   Result Value Ref Range Status   MRSA by PCR Next Gen NOT DETECTED NOT DETECTED Final    Comment: (NOTE) The GeneXpert MRSA Assay (FDA approved for NASAL specimens only), is one component of a comprehensive MRSA colonization surveillance program. It is not intended to diagnose MRSA infection nor to guide or monitor treatment for MRSA infections. Test performance is not FDA approved in patients less than 80 years old. Performed at [facility], [address], [place], [place] [address]       Radiology Studies: DG Chest 2 View  Result Date: 12/11/2021 CLINICAL DATA:  Short of breath. EXAM: CHEST - 2 VIEW COMPARISON:  08/18/2021 and older exams. FINDINGS: Mild enlargement of cardiac silhouette is stable. No mediastinal or hilar masses. Vascular congestion and bilateral interstitial prominence is similar to prior exams, extension weighted by low lung volumes. No convincing pleural effusion and no pneumothorax. Skeletal structures are grossly intact. IMPRESSION: 1. No acute cardiopulmonary disease. Findings similar to prior exams with mild cardiomegaly, vascular congestion chronic interstitial prominence, but no overt pulmonary edema Electronically Signed   By: Lajean Manes M.D.   On: 12/11/2021 12:37     Scheduled Meds:  atorvastatin  20 mg Oral Daily   enoxaparin (LOVENOX) injection  40 mg Subcutaneous Q24H   fluticasone furoate-vilanterol  1 puff Inhalation Daily   HYDROcodone-acetaminophen  1 tablet Oral TID   insulin aspart  0-15 Units Subcutaneous TID WC   insulin aspart  0-5 Units Subcutaneous QHS   levothyroxine  175 mcg Oral QAC breakfast   polyethylene glycol  17 g Oral Daily   potassium chloride SA  20 mEq Oral Daily   pramipexole  1 mg Oral QHS   predniSONE  40 mg Oral Q breakfast   senna-docusate  2 tablet Oral QHS   spironolactone  50 mg Oral Daily   tamsulosin  0.4 mg Oral QPC supper   torsemide  40 mg Oral BID   Continuous Infusions:   LOS: 1 day   Roxan Hockey  M.D on 12/12/2021 at 7:13 PM  Go to [email or web address] - for contact info  [facility] - Office  [phone]  If 7PM-7AM, please contact night-coverage [email or web address] 12/12/2021, 7:13 PM

## 2021-12-13 ENCOUNTER — Inpatient Hospital Stay (HOSPITAL_COMMUNITY): Payer: Medicare Other

## 2021-12-13 DIAGNOSIS — J9621 Acute and chronic respiratory failure with hypoxia: Secondary | ICD-10-CM | POA: Diagnosis not present

## 2021-12-13 DIAGNOSIS — J9622 Acute and chronic respiratory failure with hypercapnia: Secondary | ICD-10-CM | POA: Diagnosis not present

## 2021-12-13 DIAGNOSIS — J441 Chronic obstructive pulmonary disease with (acute) exacerbation: Secondary | ICD-10-CM | POA: Diagnosis not present

## 2021-12-13 LAB — BLOOD GAS, ARTERIAL
Acid-Base Excess: 23.1 mmol/L — ABNORMAL HIGH (ref 0.0–2.0)
Acid-Base Excess: 32.4 mmol/L — ABNORMAL HIGH (ref 0.0–2.0)
Bicarbonate: 56.9 mmol/L — ABNORMAL HIGH (ref 20.0–28.0)
Bicarbonate: 62.6 mmol/L — ABNORMAL HIGH (ref 20.0–28.0)
Drawn by: 10555
Drawn by: 23430
FIO2: 32 %
FIO2: 30 %
O2 Saturation: 94.9 %
O2 Saturation: 93.6 %
Patient temperature: 37
Patient temperature: 36.2
pCO2 arterial: >123 mmHg (ref 32–48)
pCO2 arterial: 81 mmHg (ref 32–48)
pH, Arterial: 7.27 — ABNORMAL LOW (ref 7.35–7.45)
pH, Arterial: 7.49 — ABNORMAL HIGH (ref 7.35–7.45)
pO2, Arterial: 67 mmHg — ABNORMAL LOW (ref 83–108)
pO2, Arterial: 55 mmHg — ABNORMAL LOW (ref 83–108)

## 2021-12-13 LAB — BASIC METABOLIC PANEL
Anion gap: 7 (ref 5–15)
BUN: 34 mg/dL — ABNORMAL HIGH (ref 8–23)
CO2: 43 mmol/L — ABNORMAL HIGH (ref 22–32)
Calcium: 8.9 mg/dL (ref 8.9–10.3)
Chloride: 90 mmol/L — ABNORMAL LOW (ref 98–111)
Creatinine, Ser: 1.17 mg/dL (ref 0.61–1.24)
GFR, Estimated: >60 mL/min (ref >60)
Glucose, Bld: 125 mg/dL — ABNORMAL HIGH (ref 70–99)
Potassium: 4.6 mmol/L (ref 3.5–5.1)
Sodium: 140 mmol/L (ref 135–145)

## 2021-12-13 LAB — GLUCOSE, CAPILLARY
Glucose-Capillary: 124 mg/dL — ABNORMAL HIGH (ref 70–99)
Glucose-Capillary: 110 mg/dL — ABNORMAL HIGH (ref 70–99)
Glucose-Capillary: 141 mg/dL — ABNORMAL HIGH (ref 70–99)
Glucose-Capillary: 150 mg/dL — ABNORMAL HIGH (ref 70–99)
Glucose-Capillary: 203 mg/dL — ABNORMAL HIGH (ref 70–99)

## 2021-12-13 LAB — MRSA NEXT GEN BY PCR, NASAL: MRSA by PCR Next Gen: NOT DETECTED

## 2021-12-13 MED ORDER — METHYLPREDNISOLONE SODIUM SUCC 40 MG IJ SOLR
40.0000 mg | Freq: Two times a day (BID) | INTRAMUSCULAR | Status: DC
  Administered 2021-12-13 – 2021-12-15 (×4): 40 mg via INTRAVENOUS
  Filled 2021-12-13 (×4): qty 1

## 2021-12-13 MED ORDER — CHLORHEXIDINE GLUCONATE CLOTH 2 % EX PADS
6.0000 | MEDICATED_PAD | Freq: Every day | CUTANEOUS | Status: DC
  Administered 2021-12-13 – 2021-12-15 (×4): 6 via TOPICAL

## 2021-12-13 MED ORDER — FENTANYL CITRATE PF 50 MCG/ML IJ SOSY
25.0000 ug | PREFILLED_SYRINGE | Freq: Once | INTRAMUSCULAR | Status: AC
  Administered 2021-12-13: 25 ug via INTRAVENOUS
  Filled 2021-12-13: qty 1

## 2021-12-13 MED ORDER — ORAL CARE MOUTH RINSE
15.0000 mL | OROMUCOSAL | Status: DC
  Administered 2021-12-13 – 2021-12-15 (×6): 15 mL via OROMUCOSAL

## 2021-12-13 MED ORDER — ORAL CARE MOUTH RINSE: 15.0000 mL | OROMUCOSAL | Status: DC | PRN

## 2021-12-13 NOTE — Progress Notes (Signed)
This RN had difficulty waking patient up. He responded after several sternal rubs. CBG was checked and was 124. Pt had difficulty following commands. Eventually he squeezed this RN's hand and smiled with symmetrical smile. Dr. Josephine Cables was informed and ABG ordered. Bryson Corona Edd Fabian

## 2021-12-13 NOTE — Progress Notes (Signed)
PROGRESS NOTE     Vernon Campbell, is a 81 y.o. male, DOB - November 11, 1940, [id number]  Admit date - 12/11/2021   Admitting Physician Truett Mainland, DO  Outpatient Primary MD for the patient is System, Provider Not In  LOS - 2  Chief Complaint  Patient presents with   Shortness of Breath        Brief Narrative:   81 y.o. 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 Aldactone -Daily weights and fluid input and output monitoring  2) acute COPD exacerbation--- continue steroids and bronchodilators as ordered  3) acute on chronic hypoxic and hypercapnic respiratory failure--- secondary to #1 and #2 above -Overnight patient had respiratory decompensation and significant lethargy/altered mentation--his  ABG revealed PCO2 of greater than 123 with a pH of 7.27 and PO2 of 67 on 5 L of oxygen -Patient was transferred to stepdown unit for BiPAP -Patient's bicarb was 57 suggesting chronic hypercapnic respiratory failure -Continue BiPAP use nightly and as needed -Avoid high flow oxygen--- be very judicious with oxygen to avoid further CO2 retention  4)Thrombocytopenia----bleeding concerns watch closely specially while on Lovenox  5)DM2-A1c 5.5 reflecting excellent diabetic control PTA Use Novolog/Humalog Sliding scale insulin  with Accu-Cheks/Fingersticks as ordered   6)Hypothyroidism--continue levothyroxine   Disposition/Need for in-Hospital Stay- patient unable to be discharged at this time due to --acute on chronic hypoxic respiratory failure requiring IV diuresis and steroids*  Status is: Inpatient   Disposition: The patient is from: SNF              Anticipated d/c is to: SNF              Anticipated d/c date is: 2 days              Patient currently is not medically stable to d/c. Barriers: Not Clinically Stable-   Code Status :  -  Code Status: DNR   Family Communication:    NA (patient is alert, awake and coherent)   DVT Prophylaxis  :   - SCDs   enoxaparin (LOVENOX) injection 40 mg Start: 12/11/21 2000   Lab Results  Component Value Date   PLT 128 (L) 12/12/2021    Inpatient Medications  Scheduled Meds:  atorvastatin  20 mg Oral Daily   Chlorhexidine Gluconate Cloth  6 each Topical Daily   enoxaparin (LOVENOX) injection  40 mg Subcutaneous Q24H   fluticasone furoate-vilanterol  1 puff Inhalation Daily   furosemide  60 mg Intravenous Q12H   HYDROcodone-acetaminophen  1 tablet Oral TID   insulin aspart  0-15 Units Subcutaneous TID WC   insulin aspart  0-5 Units Subcutaneous QHS   levothyroxine  175 mcg Oral QAC breakfast   mouth rinse  15 mL Mouth Rinse 4 times per day   polyethylene glycol  17 g Oral Daily   potassium chloride SA  20 mEq Oral Daily   pramipexole  1 mg Oral QHS  predniSONE  40 mg Oral Q breakfast   senna-docusate  2 tablet Oral QHS   spironolactone  50 mg Oral Daily   tamsulosin  0.4 mg Oral QPC supper   Continuous Infusions: PRN Meds:.albuterol, ondansetron **OR** ondansetron (ZOFRAN) IV, mouth rinse   Anti-infectives (From admission, onward)    None         Subjective: Vernon Campbell today has no fevers, no emesis,  No chest pain,   -Overnight patient had respiratory decompensation and significant lethargy/altered mentation--his  ABG revealed PCO2 of  greater than 123 with a pH of 7.27 and PO2 of 67 on 5 L of oxygen -Patient was transferred to stepdown unit for BiPAP -More awake after BiPAP use overnight  Objective: Vitals:   12/13/21 1602 12/13/21 1707 12/13/21 1714 12/13/21 1801  BP:  123/71  118/60  Pulse: 80 76 71 76  Resp: (!) 21 (!) 24 20 (!) 21  Temp: 98.1 F (36.7 C)     TempSrc: Oral     SpO2: 90% (!) 86% (!) 86% (!) 87%  Weight:      Height:        Intake/Output Summary (Last 24 hours) at 12/13/2021 1852 Last data filed at 12/13/2021 1821 Gross per 24 hour  Intake --  Output 3050 ml  Net -3050 ml   Filed Weights   12/11/21 1156 12/12/21 0035  Weight: 130.6 kg 129.2 kg    Physical Exam  Gen:-Awake after BiPAP use overnight  HEENT:- [place].AT, No sclera icterus Nose- [place] 3L/min and Bipap Neck-Supple Neck,No JVD,.  Lungs-diminished in bases, no wheezing  CV- S1, S2 normal, regular  Abd-  +ve B.Sounds, Abd Soft, No tenderness,    Extremity/Skin:-Significant lower extremity edema with venous stasis, pedal pulses present  Psych-affect is appropriate, oriented x3 Neuro-generalized weakness no new focal deficits, no tremors, at baseline patient ambulates with electric wheelchair and manual wheelchair  Data Reviewed: I have personally reviewed following labs and imaging studies  CBC: Recent Labs  Lab 12/11/21 1339 12/12/21 0637  WBC 6.8 4.1  HGB 16.0 15.3  HCT 55.6* 52.9*  MCV 106.9* 107.5*  PLT 132* 354*   Basic Metabolic Panel: Recent Labs  Lab 12/11/21 1305 12/12/21 0637 12/13/21 0527  NA 139 141 140  K 4.2 4.4 4.6  CL 91* 92* 90*  CO2 41* 43* 43*  GLUCOSE 116* 159* 125*  BUN 20 23 34*  CREATININE 0.98 1.08 1.17  CALCIUM 8.8* 8.7* 8.9   GFR: Estimated Creatinine Clearance: 64.6 mL/min (by C-G formula based on SCr of 1.17 mg/dL). Liver Function Tests: Recent Labs  Lab 12/11/21 1305  AST 21  ALT 17  ALKPHOS 105  BILITOT 2.2*  PROT 6.8  ALBUMIN 3.6   Recent Labs    12/12/21 0637   HGBA1C 5.5   Recent Results (from the past 240 hour(s))  Resp Panel by RT-PCR (Flu A&B, Covid) Anterior Nasal Swab     Status: None   Collection Time: 12/11/21 12:28 PM   Specimen: Anterior Nasal Swab  Result Value Ref Range Status   SARS Coronavirus 2 by RT PCR NEGATIVE NEGATIVE Final    Comment: (NOTE) SARS-CoV-2 target nucleic acids are NOT DETECTED.  The SARS-CoV-2 RNA is generally detectable in upper respiratory specimens during the acute phase of infection. The lowest concentration of SARS-CoV-2 viral copies this assay can detect is 138 copies/mL. A negative result does not preclude SARS-Cov-2 infection and should not be used as the sole basis for treatment or other patient  management decisions. A negative result may occur with  improper specimen collection/handling, submission of specimen other than nasopharyngeal swab, presence of viral mutation(s) within the areas targeted by this assay, and inadequate number of viral copies(<138 copies/mL). A negative result must be combined with clinical observations, patient history, and epidemiological information. The expected result is Negative.  Fact Sheet for Patients:  EntrepreneurPulse.com.au  Fact Sheet for Healthcare Providers:  IncredibleEmployment.be  This test is no t yet approved or cleared by the [place] FDA and  has been authorized for detection and/or diagnosis of SARS-CoV-2 by FDA under an Emergency Use Authorization (EUA). This EUA will remain  in effect (meaning this test can be used) for the duration of the COVID-19 declaration under Section 564(b)(1) of the Act, 21 U.S.C.section 360bbb-3(b)(1), unless the authorization is terminated  or revoked sooner.       Influenza A by PCR NEGATIVE NEGATIVE Final   Influenza B by PCR NEGATIVE NEGATIVE Final    Comment: (NOTE) The Xpert Xpress SARS-CoV-2/FLU/RSV plus assay is intended as an aid in the diagnosis of influenza from  Nasopharyngeal swab specimens and should not be used as a sole basis for treatment. Nasal washings and aspirates are unacceptable for Xpert Xpress SARS-CoV-2/FLU/RSV testing.  Fact Sheet for Patients: EntrepreneurPulse.com.au  Fact Sheet for Healthcare Providers: IncredibleEmployment.be  This test is not yet approved or cleared by the [place] FDA and has been authorized for detection and/or diagnosis of SARS-CoV-2 by FDA under an Emergency Use Authorization (EUA). This EUA will remain in effect (meaning this test can be used) for the duration of the COVID-19 declaration under Section 564(b)(1) of the Act, 21 U.S.C. section 360bbb-3(b)(1), unless the authorization is terminated or revoked.  Performed at [facility], [address], [place], [place] [address]   MRSA Next Gen by PCR, Nasal     Status: None   Collection Time: 12/12/21  1:52 AM   Specimen: Nasal Mucosa; Nasal Swab  Result Value Ref Range Status   MRSA by PCR Next Gen NOT DETECTED NOT DETECTED Final    Comment: (NOTE) The GeneXpert MRSA Assay (FDA approved for NASAL specimens only), is one component of a comprehensive MRSA colonization surveillance program. It is not intended to diagnose MRSA infection nor to guide or monitor treatment for MRSA infections. Test performance is not FDA approved in patients less than 67 years old. Performed at [facility], [address] [place], [place] [address]   MRSA Next Gen by PCR, Nasal     Status: None   Collection Time: 12/13/21  6:17 AM   Specimen: Nasal Mucosa; Nasal Swab  Result Value Ref Range Status   MRSA by PCR Next Gen NOT DETECTED NOT DETECTED Final    Comment: (NOTE) The GeneXpert MRSA Assay (FDA approved for NASAL specimens only), is one component of a comprehensive MRSA colonization surveillance program. It is not intended to diagnose MRSA infection nor to guide or monitor treatment for MRSA infections. Test performance  is not FDA approved in patients less than 65 years old. Performed at [facility], [address], [place], [place] [address]     Radiology Studies: DG Chest 1 View  Result Date: 12/13/2021 CLINICAL DATA:  81 year old male with shortness of breath. EXAM: CHEST  1 VIEW COMPARISON:  Chest radiographs 12/11/2021. FINDINGS: Portable AP semi upright view at 0554 hours. Stable cardiomegaly and mediastinal contours. Elevated left hemidiaphragm 2 days ago, paucity of bowel gas now compared to that exam. Stable left lung base hypo  ventilation. Background pulmonary vascular congestion although doubt overt edema. No pneumothorax or air bronchograms identified. Visualized tracheal air column is within normal limits. No acute osseous abnormality identified. IMPRESSION: Stable cardiomegaly and elevated left hemidiaphragm with left lung base hypo ventilation. Pulmonary vascular congestion without overt edema. Electronically Signed   By: Genevie Ann M.D.   On: 12/13/2021 06:21     Scheduled Meds:  atorvastatin  20 mg Oral Daily   Chlorhexidine Gluconate Cloth  6 each Topical Daily   enoxaparin (LOVENOX) injection  40 mg Subcutaneous Q24H   fluticasone furoate-vilanterol  1 puff Inhalation Daily   furosemide  60 mg Intravenous Q12H   HYDROcodone-acetaminophen  1 tablet Oral TID   insulin aspart  0-15 Units Subcutaneous TID WC   insulin aspart  0-5 Units Subcutaneous QHS   levothyroxine  175 mcg Oral QAC breakfast   mouth rinse  15 mL Mouth Rinse 4 times per day   polyethylene glycol  17 g Oral Daily   potassium chloride SA  20 mEq Oral Daily   pramipexole  1 mg Oral QHS   predniSONE  40 mg Oral Q breakfast   senna-docusate  2 tablet Oral QHS   spironolactone  50 mg Oral Daily   tamsulosin  0.4 mg Oral QPC supper   Continuous Infusions:   LOS: 2 days   Roxan Hockey M.D on 12/13/2021 at 6:52 PM  Go to [email or web address] - for contact info  [facility] - Office  [phone]  If 7PM-7AM, please  contact night-coverage [email or web address] 12/13/2021, 6:52 PM

## 2021-12-13 NOTE — Progress Notes (Signed)
Received critical lab value: PCO2:>123. This Probation officer made primary nurse aware and notified Adefeso,MD via [employer].

## 2021-12-13 NOTE — Progress Notes (Signed)
Critical Note RN called due to patient being lethargic and difficult to arouse even with sternal rub.  He was admitted due to HFpEF, he has an acute COPD exacerbation and acute on chronic hypoxic respiratory failure.  ABG was ordered. ABG    Component Value Date/Time   PHART 7.27 (L) 12/13/2021 0515   PCO2ART >123 (HH) 12/13/2021 0515   PO2ART 67 (L) 12/13/2021 0515   HCO3 56.9 (H) 12/13/2021 0515   O2SAT 94.9 12/13/2021 0515   Patient was noted to be hypoxic and hypercapnic, BiPAP was indicated and patient was transitioned to stepdown unit for closer monitoring.  At bedside, patient mental status has improved, he was now awake and alert x3.  Physical exam  BP 121/73 (BP Location: Left Arm)   Pulse 89   Temp (!) 97.5 F (36.4 C) (Oral)   Resp 19   Ht 5\' 10"  (1.778 m)   Wt 129.2 kg   SpO2 95%   BMI 40.87 kg/m   Gen:- Awake Alert, oriented x3 HEENT:- [place].AT, No sclera icterus Neck-Supple Neck,No JVD,.  Lungs-decreased breath sounds in lower lobes CV- S1, S2 normal, regular Abd-  +ve B.Sounds, Abd Soft, No tenderness,    Extremity/Skin:-Bilateral venous stasis with lower extremity edema    Psych-affect is appropriate, oriented x3 Neuro-no new focal deficits, no tremors  Assessment and plan Acute on chronic respiratory failure with hypoxia and hypercapnia Patient was transferred to stepdown unit Continue BiPAP to maintain O2 sats > 94% with plan to wean patient off this and transition to supplemental oxygen as tolerated Repeat ABG this morning Continue management per primary team  Critical time: 37 minutes   Critical care personally provided  managing the patient due to high probability of clinically significant and life threatening deterioration. This critical care time included obtaining a history; examining the patient, pulse oximetry; ordering and review of studies; arranging urgent treatment with development of a management plan; evaluation of patient's response of  treatment; frequent reassessment; and discussions with other providers.  This critical care time was performed to assess and manage the high probability of imminent and life threatening deterioration that could result in multi-organ failure.

## 2021-12-13 NOTE — Progress Notes (Signed)
Date and time results received: 12/13/21 0925 (use smartphrase ".now" to insert current time)  Test: pCo2 from ABG Critical Value: 75  Name of Provider Notified: Courage, MD.  Orders Received? Or Actions Taken?:  Taken off BiPAP. Placed on regular nasal cannula.

## 2021-12-14 DIAGNOSIS — J441 Chronic obstructive pulmonary disease with (acute) exacerbation: Secondary | ICD-10-CM | POA: Diagnosis not present

## 2021-12-14 LAB — RENAL FUNCTION PANEL
Albumin: 3.5 g/dL (ref 3.5–5.0)
BUN: 36 mg/dL — ABNORMAL HIGH (ref 8–23)
CO2: >45 mmol/L — ABNORMAL HIGH (ref 22–32)
Calcium: 8.8 mg/dL — ABNORMAL LOW (ref 8.9–10.3)
Chloride: 87 mmol/L — ABNORMAL LOW (ref 98–111)
Creatinine, Ser: 1.04 mg/dL (ref 0.61–1.24)
GFR, Estimated: >60 mL/min (ref >60)
Glucose, Bld: 152 mg/dL — ABNORMAL HIGH (ref 70–99)
Phosphorus: 2.4 mg/dL — ABNORMAL LOW (ref 2.5–4.6)
Potassium: 4.3 mmol/L (ref 3.5–5.1)
Sodium: 139 mmol/L (ref 135–145)

## 2021-12-14 LAB — GLUCOSE, CAPILLARY
Glucose-Capillary: 211 mg/dL — ABNORMAL HIGH (ref 70–99)
Glucose-Capillary: 98 mg/dL (ref 70–99)
Glucose-Capillary: 191 mg/dL — ABNORMAL HIGH (ref 70–99)
Glucose-Capillary: 153 mg/dL — ABNORMAL HIGH (ref 70–99)

## 2021-12-14 NOTE — TOC Initial Note (Signed)
Transition of Care [facility]) - Initial/Assessment Note    Patient Details  Name: Vernon Campbell MRN: [id number] Date of Birth: 18-Aug-1940  [facility]) CM/SW Contact:    Villa Herb, LCSWA Phone Number: 12/14/2021, 12:49 PM  Clinical Narrative:                 Pt is high risk for readmission. Pt is a long term resident at [facility]. CSW spoke to Vernon Campbell in admissions who confirms this. Plan will be for return once medically stable. TOC to follow.   Expected Discharge Plan: Long Term Nursing Home Barriers to Discharge: Continued Medical Work up   Patient Goals and CMS Choice Patient states their goals for this hospitalization and ongoing recovery are:: reutrn to [employer] Medicare.gov Compare Post Acute Care list provided to:: Patient Choice offered to / list presented to : Patient  Expected Discharge Plan and Services Expected Discharge Plan: Long Term Nursing Home In-house Referral: Clinical Social Work Discharge Planning Services: CM Consult Post Acute Care Choice: Nursing Home Living arrangements for the past 2 months: Skilled Nursing Facility                                      Prior Living Arrangements/Services Living arrangements for the past 2 months: Skilled Nursing Facility Lives with:: Facility Resident Patient language and need for interpreter reviewed:: Yes Do you feel safe going back to the place where you live?: Yes      Need for Family Participation in Patient Care: Yes (Comment) Care giver support system in place?: Yes (comment)   Criminal Activity/Legal Involvement Pertinent to Current Situation/Hospitalization: No - Comment as needed  Activities of Daily Living Home Assistive Devices/Equipment: Other (Comment) (lives at [facility] nursing facility, has dentures, oxygen supplies, private wheelchair and walker) ADL Screening (condition at time of admission) Patient's cognitive ability adequate to safely complete daily activities?:  Yes Is the patient deaf or have difficulty hearing?: No Does the patient have difficulty seeing, even when wearing glasses/contacts?: No Does the patient have difficulty concentrating, remembering, or making decisions?: No Patient able to express need for assistance with ADLs?: Yes Does the patient have difficulty dressing or bathing?: Yes Independently performs ADLs?: No Communication: Independent Dressing (OT): Needs assistance Is this a change from baseline?: Change from baseline, expected to last <3days Grooming: Needs assistance Is this a change from baseline?: Change from baseline, expected to last <3 days Feeding: Independent Bathing: Needs assistance Is this a change from baseline?: Change from baseline, expected to last <3 days Toileting: Needs assistance Is this a change from baseline?: Change from baseline, expected to last <3 days In/Out Bed: Needs assistance Is this a change from baseline?: Change from baseline, expected to last <3 days Walks in Home: Needs assistance Is this a change from baseline?: Change from baseline, expected to last <3 days Does the patient have difficulty walking or climbing stairs?: Yes Weakness of Legs: Both Weakness of Arms/Hands: None  Permission Sought/Granted                  Emotional Assessment Appearance:: Appears stated age       Alcohol / Substance Use: Not Applicable Psych Involvement: No (comment)  Admission diagnosis:  SOB (shortness of breath) [R06.02] COPD exacerbation (HCC) [J44.1] Acute respiratory failure with hypoxia and hypercapnia (HCC) [J96.01, J96.02] Patient Active Problem List   Diagnosis Date Noted  SOB (shortness of breath) 12/11/2021   COPD with acute exacerbation ([place]) 12/11/2021   Hypokalemia 08/20/2021   Acute on chronic diastolic heart failure ([place]) 08/18/2021   Restless leg syndrome 08/18/2021   Chronic respiratory failure with hypoxia ([place]) 08/18/2021   Acute exacerbation of CHF (congestive  heart failure) ([place]) 10/01/2020   Hyperlipidemia 10/01/2020   Hypothyroidism 10/01/2020   Macrocytic anemia 10/01/2020   AKI (acute kidney injury) ([place]) 10/01/2020   Obesity, Class III, BMI 40-49.9 (morbid obesity) ([place]) 10/01/2020   Acute on chronic respiratory failure with hypoxia ([place]) 10/01/2020   Right thigh pain 10/01/2020   Right groin pain 10/01/2020   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 [place] [address] Phone: [phone] Fax: [phone]     Social Determinants of Health (SDOH) Interventions    Readmission Risk Interventions    12/14/2021   12:46 PM  Readmission Risk Prevention Plan  Transportation Screening Complete  HRI or [place] Complete  Social Work Consult for [place] Planning/Counseling Complete  Palliative Care Screening Not Applicable  Medication Review Press photographer) Complete

## 2021-12-14 NOTE — Progress Notes (Signed)
PROGRESS NOTE     Vernon Campbell, is a 81 y.o. male, DOB - 30-Nov-1940, [id number]  Admit date - 12/11/2021   Admitting Physician No admitting provider for patient encounter.  Outpatient Primary MD for the patient is System, Provider Not In  LOS - 3  Chief Complaint  Patient presents with   Shortness of Breath        Brief Narrative:   81 y.o. 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 Aldactone -Daily weights and fluid input and output monitoring  Intake/Output Summary (Last 24 hours) at 12/14/2021 1921 Last data filed at 12/14/2021 1334 Gross per 24 hour  Intake 240 ml  Output 4900 ml  Net -4660 ml   2)Acute COPD exacerbation--- continue steroids and bronchodilators as ordered  3) acute on chronic hypoxic and hypercapnic respiratory failure--- secondary to #1 and #2 above -Patient is at risk for respiratory decompensation due to CO2 retention so patient judicious with oxygen to avoid further CO2 retention -Continue BiPAP use nightly and as needed -Review ABG  4)Thrombocytopenia----bleeding concerns watch closely specially while on Lovenox  5)DM2-A1c 5.5 reflecting excellent diabetic control PTA Use Novolog/Humalog Sliding scale insulin with Accu-Cheks/Fingersticks as ordered   6)Hypothyroidism--continue  levothyroxine   Disposition/Need for in-Hospital Stay- patient unable to be discharged at this time due to --acute on chronic hypoxic respiratory failure requiring IV diuresis and steroids --his  ABG revealed PCO2 of greater than 123 with a pH of 7.27 and PO2 of 67 on 5 L of oxygen -Continue BiPAP as needed and nightly -  Status is: Inpatient   Disposition: The patient is from: SNF              Anticipated d/c is to: SNF              Anticipated d/c date is: 2 days              Patient currently is not medically stable to d/c. Barriers: Not Clinically Stable-   Code Status :  -  Code Status: DNR   Family Communication:    NA (patient is alert, awake and coherent)   DVT Prophylaxis  :   - SCDs   enoxaparin (LOVENOX) injection 40 mg Start: 12/11/21 2000   Lab Results  Component Value Date   PLT 128 (L) 12/12/2021    Inpatient Medications  Scheduled Meds:  atorvastatin  20 mg Oral Daily   Chlorhexidine Gluconate Cloth  6 each Topical Daily   enoxaparin (LOVENOX) injection  40 mg Subcutaneous Q24H   fluticasone furoate-vilanterol  1 puff Inhalation Daily   furosemide  60 mg Intravenous Q12H   HYDROcodone-acetaminophen  1 tablet Oral TID   insulin aspart  0-15 Units Subcutaneous TID WC   insulin aspart  0-5 Units Subcutaneous QHS   levothyroxine  175 mcg Oral QAC breakfast   methylPREDNISolone (SOLU-MEDROL) injection  40 mg Intravenous Q12H   mouth rinse  15 mL  Mouth Rinse 4 times per day   polyethylene glycol  17 g Oral Daily   potassium chloride SA  20 mEq Oral Daily   pramipexole  1 mg Oral QHS   senna-docusate  2 tablet Oral QHS   spironolactone  50 mg Oral Daily   tamsulosin  0.4 mg Oral QPC supper   Continuous Infusions: PRN Meds:.albuterol, ondansetron **OR** ondansetron (ZOFRAN) IV, mouth rinse   Anti-infectives (From admission, onward)    None         Subjective: Vernon Campbell today has no fevers, no emesis,  No chest pain,    - tolerated BiPAP  overnight -Voiding well   Objective: Vitals:   12/14/21 1536 12/14/21 1600 12/14/21 1700 12/14/21 1800  BP:  (!) 115/59 (!) 110/55 108/86  Pulse:  68 79 79  Resp:  18 (!) 31 18  Temp: 97.7 F (36.5 C)     TempSrc:      SpO2:  (!) 88% (!) 89% (!) 87%  Weight:      Height:        Intake/Output Summary (Last 24 hours) at 12/14/2021 1921 Last data filed at 12/14/2021 1334 Gross per 24 hour  Intake 240 ml  Output 4900 ml  Net -4660 ml   Filed Weights   12/11/21 1156 12/12/21 0035  Weight: 130.6 kg 129.2 kg    Physical Exam  Gen:-Awake after BiPAP use overnight  HEENT:- [place].AT, No sclera icterus Nose- [place] 3L/min and Bipap Neck-Supple Neck,No JVD,.  Lungs-diminished in bases, no wheezing  CV- S1, S2 normal, regular  Abd-  +ve B.Sounds, Abd Soft, No tenderness,    Extremity/Skin:-Significant lower extremity edema with venous stasis, pedal pulses present  Psych-affect is appropriate, oriented x3 Neuro-generalized weakness no new focal deficits, no tremors, at baseline patient ambulates with electric wheelchair and manual wheelchair  Data Reviewed: I have personally reviewed following labs and imaging studies  CBC: Recent Labs  Lab 12/11/21 1339 12/12/21 0637  WBC 6.8 4.1  HGB 16.0 15.3  HCT 55.6* 52.9*  MCV 106.9* 107.5*  PLT 132* 0000000*   Basic Metabolic Panel: Recent Labs  Lab 12/11/21 1305 12/12/21 0637 12/13/21 0527 12/14/21 0414  NA 139 141 140 139  K 4.2 4.4 4.6 4.3  CL 91* 92* 90* 87*  CO2 41* 43* 43* >45*  GLUCOSE 116* 159* 125* 152*  BUN 20 23 34* 36*  CREATININE 0.98 1.08 1.17 1.04  CALCIUM 8.8* 8.7* 8.9 8.8*  PHOS  --   --   --  2.4*   GFR: Estimated Creatinine Clearance: 72.7 mL/min (by C-G formula based on SCr of 1.04 mg/dL). Liver Function Tests: Recent Labs  Lab 12/11/21 1305 12/14/21 0414  AST 21  --   ALT 17  --   ALKPHOS 105  --   BILITOT 2.2*  --   PROT 6.8  --   ALBUMIN 3.6 3.5   Recent Labs    12/12/21 0637  HGBA1C 5.5    Recent Results (from the past 240 hour(s))  Resp Panel by RT-PCR (Flu A&B, Covid) Anterior Nasal Swab     Status: None   Collection Time: 12/11/21 12:28 PM   Specimen: Anterior Nasal Swab  Result Value Ref Range Status   SARS Coronavirus 2 by RT PCR NEGATIVE NEGATIVE Final    Comment: (NOTE) SARS-CoV-2 target nucleic acids are NOT DETECTED.  The SARS-CoV-2 RNA is generally detectable in upper respiratory specimens during the acute phase of infection. The lowest concentration of SARS-CoV-2 viral  copies this assay can detect is 138 copies/mL. A negative result does not preclude SARS-Cov-2 infection and should not be used as the sole basis for treatment or other patient management decisions. A negative result may occur with  improper specimen collection/handling, submission of specimen other than nasopharyngeal swab, presence of viral mutation(s) within the areas targeted by this assay, and inadequate number of viral copies(<138 copies/mL). A negative result must be combined with clinical observations, patient history, and epidemiological information. The expected result is Negative.  Fact Sheet for Patients:  EntrepreneurPulse.com.au  Fact Sheet for Healthcare Providers:  IncredibleEmployment.be  This test is no t yet approved or cleared by the [place] FDA and  has been authorized for detection and/or diagnosis of SARS-CoV-2 by FDA under an Emergency Use Authorization (EUA). This EUA will remain  in effect (meaning this test can be used) for the duration of the COVID-19 declaration under Section 564(b)(1) of the Act, 21 U.S.C.section 360bbb-3(b)(1), unless the authorization is terminated  or revoked sooner.       Influenza A by PCR NEGATIVE NEGATIVE Final   Influenza B by PCR NEGATIVE NEGATIVE Final    Comment: (NOTE) The Xpert Xpress SARS-CoV-2/FLU/RSV plus assay is intended as an aid in the diagnosis of influenza from  Nasopharyngeal swab specimens and should not be used as a sole basis for treatment. Nasal washings and aspirates are unacceptable for Xpert Xpress SARS-CoV-2/FLU/RSV testing.  Fact Sheet for Patients: EntrepreneurPulse.com.au  Fact Sheet for Healthcare Providers: IncredibleEmployment.be  This test is not yet approved or cleared by the [place] FDA and has been authorized for detection and/or diagnosis of SARS-CoV-2 by FDA under an Emergency Use Authorization (EUA). This EUA will remain in effect (meaning this test can be used) for the duration of the COVID-19 declaration under Section 564(b)(1) of the Act, 21 U.S.C. section 360bbb-3(b)(1), unless the authorization is terminated or revoked.  Performed at [facility], [address], [place], [place] [address]   MRSA Next Gen by PCR, Nasal     Status: None   Collection Time: 12/12/21  1:52 AM   Specimen: Nasal Mucosa; Nasal Swab  Result Value Ref Range Status   MRSA by PCR Next Gen NOT DETECTED NOT DETECTED Final    Comment: (NOTE) The GeneXpert MRSA Assay (FDA approved for NASAL specimens only), is one component of a comprehensive MRSA colonization surveillance program. It is not intended to diagnose MRSA infection nor to guide or monitor treatment for MRSA infections. Test performance is not FDA approved in patients less than 72 years old. Performed at [facility], [address] [place], [place] [address]   MRSA Next Gen by PCR, Nasal     Status: None   Collection Time: 12/13/21  6:17 AM   Specimen: Nasal Mucosa; Nasal Swab  Result Value Ref Range Status   MRSA by PCR Next Gen NOT DETECTED NOT DETECTED Final    Comment: (NOTE) The GeneXpert MRSA Assay (FDA approved for NASAL specimens only), is one component of a comprehensive MRSA colonization surveillance program. It is not intended to diagnose MRSA infection nor to guide or monitor treatment for MRSA infections. Test performance  is not FDA approved in patients less than 40 years old. Performed at [facility], [address], [place], [place] [address]     Radiology Studies: DG Chest 1 View  Result Date: 12/13/2021 CLINICAL DATA:  81 year old male with shortness of breath. EXAM: CHEST  1 VIEW COMPARISON:  Chest radiographs 12/11/2021. FINDINGS: Portable AP semi  upright view at 0554 hours. Stable cardiomegaly and mediastinal contours. Elevated left hemidiaphragm 2 days ago, paucity of bowel gas now compared to that exam. Stable left lung base hypo ventilation. Background pulmonary vascular congestion although doubt overt edema. No pneumothorax or air bronchograms identified. Visualized tracheal air column is within normal limits. No acute osseous abnormality identified. IMPRESSION: Stable cardiomegaly and elevated left hemidiaphragm with left lung base hypo ventilation. Pulmonary vascular congestion without overt edema. Electronically Signed   By: Genevie Ann M.D.   On: 12/13/2021 06:21     Scheduled Meds:  atorvastatin  20 mg Oral Daily   Chlorhexidine Gluconate Cloth  6 each Topical Daily   enoxaparin (LOVENOX) injection  40 mg Subcutaneous Q24H   fluticasone furoate-vilanterol  1 puff Inhalation Daily   furosemide  60 mg Intravenous Q12H   HYDROcodone-acetaminophen  1 tablet Oral TID   insulin aspart  0-15 Units Subcutaneous TID WC   insulin aspart  0-5 Units Subcutaneous QHS   levothyroxine  175 mcg Oral QAC breakfast   methylPREDNISolone (SOLU-MEDROL) injection  40 mg Intravenous Q12H   mouth rinse  15 mL Mouth Rinse 4 times per day   polyethylene glycol  17 g Oral Daily   potassium chloride SA  20 mEq Oral Daily   pramipexole  1 mg Oral QHS   senna-docusate  2 tablet Oral QHS   spironolactone  50 mg Oral Daily   tamsulosin  0.4 mg Oral QPC supper   Continuous Infusions:   LOS: 3 days   Roxan Hockey M.D on 12/14/2021 at 7:21 PM  Go to [email or web address] - for contact info  [facility] - Office   [phone]  If 7PM-7AM, please contact night-coverage [email or web address] 12/14/2021, 7:21 PM

## 2021-12-15 ENCOUNTER — Inpatient Hospital Stay (HOSPITAL_COMMUNITY): Payer: Medicare Other

## 2021-12-15 DIAGNOSIS — J441 Chronic obstructive pulmonary disease with (acute) exacerbation: Secondary | ICD-10-CM | POA: Diagnosis not present

## 2021-12-15 LAB — CBC
HCT: 54.2 % — ABNORMAL HIGH (ref 39.0–52.0)
Hemoglobin: 16.2 g/dL (ref 13.0–17.0)
MCH: 30.8 pg (ref 26.0–34.0)
MCHC: 29.9 g/dL — ABNORMAL LOW (ref 30.0–36.0)
MCV: 103 fL — ABNORMAL HIGH (ref 80.0–100.0)
Platelets: 134 10*3/uL — ABNORMAL LOW (ref 150–400)
RBC: 5.26 MIL/uL (ref 4.22–5.81)
RDW: 15.3 % (ref 11.5–15.5)
WBC: 7.3 10*3/uL (ref 4.0–10.5)
nRBC: 0 % (ref 0.0–0.2)

## 2021-12-15 LAB — BLOOD GAS, ARTERIAL
Acid-Base Excess: 28.9 mmol/L — ABNORMAL HIGH (ref 0.0–2.0)
Bicarbonate: 60.5 mmol/L — ABNORMAL HIGH (ref 20.0–28.0)
Drawn by: 22223
FIO2: 32 %
O2 Saturation: 97.7 %
Patient temperature: 37
pCO2 arterial: 85 mmHg (ref 32–48)
pH, Arterial: 7.46 — ABNORMAL HIGH (ref 7.35–7.45)
pO2, Arterial: 77 mmHg — ABNORMAL LOW (ref 83–108)

## 2021-12-15 LAB — RENAL FUNCTION PANEL
Albumin: 3.4 g/dL — ABNORMAL LOW (ref 3.5–5.0)
Anion gap: 10 (ref 5–15)
BUN: 34 mg/dL — ABNORMAL HIGH (ref 8–23)
CO2: 44 mmol/L — ABNORMAL HIGH (ref 22–32)
Calcium: 8.9 mg/dL (ref 8.9–10.3)
Chloride: 85 mmol/L — ABNORMAL LOW (ref 98–111)
Creatinine, Ser: 0.92 mg/dL (ref 0.61–1.24)
GFR, Estimated: >60 mL/min (ref >60)
Glucose, Bld: 160 mg/dL — ABNORMAL HIGH (ref 70–99)
Phosphorus: 3.2 mg/dL (ref 2.5–4.6)
Potassium: 4.1 mmol/L (ref 3.5–5.1)
Sodium: 139 mmol/L (ref 135–145)

## 2021-12-15 LAB — GLUCOSE, CAPILLARY
Glucose-Capillary: 191 mg/dL — ABNORMAL HIGH (ref 70–99)
Glucose-Capillary: 146 mg/dL — ABNORMAL HIGH (ref 70–99)
Glucose-Capillary: 137 mg/dL — ABNORMAL HIGH (ref 70–99)

## 2021-12-15 LAB — BRAIN NATRIURETIC PEPTIDE: B Natriuretic Peptide: 65 pg/mL (ref 0.0–100.0)

## 2021-12-15 MED ORDER — TORSEMIDE 40 MG PO TABS: 40.0000 mg | ORAL_TABLET | ORAL | 2 refills | Status: DC

## 2021-12-15 MED ORDER — ALBUTEROL SULFATE (2.5 MG/3ML) 0.083% IN NEBU: 2.5000 mg | INHALATION_SOLUTION | RESPIRATORY_TRACT | 12 refills | Status: DC | PRN

## 2021-12-15 MED ORDER — FLUTICASONE FUROATE-VILANTEROL 100-25 MCG/ACT IN AEPB: 1.0000 | INHALATION_SPRAY | Freq: Every day | RESPIRATORY_TRACT | 3 refills | Status: DC

## 2021-12-15 MED ORDER — HYDROCODONE-ACETAMINOPHEN 5-325 MG PO TABS: 1.0000 | ORAL_TABLET | Freq: Three times a day (TID) | ORAL | 0 refills | Status: DC | PRN

## 2021-12-15 MED ORDER — DOXYCYCLINE HYCLATE 100 MG PO TABS: 100.0000 mg | ORAL_TABLET | Freq: Two times a day (BID) | ORAL | 0 refills | Status: AC

## 2021-12-15 MED ORDER — PREDNISONE 20 MG PO TABS: 40.0000 mg | ORAL_TABLET | Freq: Every day | ORAL | 0 refills | Status: AC

## 2021-12-15 NOTE — Progress Notes (Signed)
EMS tranpsort arranged. D/c Clinicals sent to facility. Attempt to notify son not successful as son's number is listed as facility number.  TOC signing off.

## 2021-12-15 NOTE — Discharge Summary (Signed)
Vernon Campbell, is a 81 y.o. male  DOB 09-Apr-1940  MRN [id number].  Admission date:  12/11/2021  Admitting Physician  No admitting provider for patient encounter.  Discharge Date:  12/15/2021   Primary MD  System, Provider Not In  Recommendations for primary care physician for things to follow:   1)You will Benefit from CPAP use at night when you falls asleep-however you have refused CPAP Machine at this time 2)Very low-salt diet advised--low-sodium diet 3)Weigh yourself daily, call if you gain more than 3 pounds in 1 day or more than 5 pounds in 1 week as your diuretic medications may need to be adjusted 4)you need oxygen at home at 2 L via nasal cannula continuously while awake and while asleep--- smoking or having open fires around oxygen can cause fire, significant injury and death  Admission Diagnosis  SOB (shortness of breath) [R06.02] COPD exacerbation (HCC) [J44.1] Acute respiratory failure with hypoxia and hypercapnia ([place]) [J96.01, J96.02]   Discharge Diagnosis  SOB (shortness of breath) [R06.02] COPD exacerbation (HCC) [J44.1] Acute respiratory failure with hypoxia and hypercapnia (HCC) [J96.01, J96.02]    Principal Problem:   COPD with acute exacerbation ([place]) Active Problems:   Hypothyroidism   Obesity, Class III, BMI 40-49.9 (morbid obesity) ([place])   Acute on chronic respiratory failure with hypoxia (HCC)   Chronic stasis dermatitis      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, bilateral    Constipation, chronic    Dermatochalasis of eyelid     Extreme obesity with alveolar hypoventilation (HCC)    History of tobacco use    Hyperlipidemia    Hypermetropia, bilateral    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 No pertinent surgical history.     HPI  from the history and physical done on the day of admission:   HPI: Vernon Campbell is a 81 y.o. 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  Patient seen due to increased respiratory work.  This has been increasing over the past week.  The patient has gotten several doses of IM Lasix throughout the week without improvement.  Due to continued problems with breathing, the patient was referred to the hospital for evaluation.  He does have a mild cough without sputum production.  He denies fevers, chills.  Denies weight gain or worsening of his peripheral edema.   Review of Systems: As mentioned in the history of present illness. All other systems reviewed and are negative.   Hospital Course:   Assessment and Plan: 1)HFpEF--acute on chronic diastolic dysfunction CHF- -Echo from May 2023 with EF of 65 to 70% with grade 1 diastolic dysfunction and moderate  aortic stenosis -Patient with chronic lower extremity venous stasis/edema -Patient was diuresed with IV Lasix and p.o. Aldactone -Fluid balance is negative -BNP is not elevated -Weight is down to 278 pounds from 288 pounds 3 days ago -Okay to change torsemide upon discharge to 60 mg every morning and 40 mg qpm  -Continue Aldactone 50 mg daily  2)Acute on chronic Hypoxic and Hypercapnic Respiratory Failure--- secondary to #1 and #2 above -Patient is at risk for respiratory decompensation due to CO2 retention so patient judicious with oxygen to avoid further CO2 retention -Patient clinical status improved with BiPAP use -Patient no longer wants to use BiPAP--- he is refusing to be evaluated to see if he qualifies for CPAP/BiPAP -Oxygen requirement is back to baseline 2 L at this time -Repeat ABG on 3 L of oxygen shows PCO2 of 85 but pH is 7.46 with a bicarb of 60.5 consistent with chronic hypercapnia that is actually well compensated--no respiratory acidosis  3)Acute COPD Exacerbation--- much improved overall -Okay to discharge on bronchodilators, prednisone and doxycycline  4)Thrombocytopenia----mild but stable thrombocytopenia, no bleeding concerns    5)DM2-A1c 5.5 reflecting excellent diabetic control PTA -Diet controlled no need for medications at this time   6)Hypothyroidism--stable, TSH 1.1, continue levothyroxine .   Disposition: The patient is from: SNF--- long term  resident              Anticipated d/c is to: SNF--long term  resident  Discharge Condition: stable  Follow UP--- PCP as advised  Code Status :  -  Code Status: DNR   Diet and Activity recommendation:  As advised  Discharge Instructions    Discharge Instructions     Diet - low sodium heart healthy   Complete by: As directed    Discharge instructions   Complete by: As directed    1)You will Benefit from CPAP use at night when you falls asleep-however you have refused CPAP Machine at this time 2)Very  low-salt diet advised--low-sodium diet 3)Weigh yourself daily, call if you gain more than 3 pounds in 1 day or more than 5 pounds in 1 week as your diuretic medications may need to be adjusted 4)you need oxygen at home at 2 L via nasal cannula continuously while awake and while asleep--- smoking or having open fires around oxygen can cause fire, significant injury and death   Increase activity slowly   Complete by: As directed          Discharge Medications     Allergies as of 12/15/2021   No Known Allergies      Medication List     TAKE these medications  albuterol (2.5 MG/3ML) 0.083% nebulizer solution Commonly known as: PROVENTIL Take 3 mLs (2.5 mg total) by nebulization every 2 (two) hours as needed for shortness of breath.   atorvastatin 20 MG tablet Commonly known as: LIPITOR Take 20 mg by mouth daily.   doxycycline 100 MG tablet Commonly known as: VIBRA-TABS Take 1 tablet (100 mg total) by mouth 2 (two) times daily for 5 days.   fluticasone furoate-vilanterol 100-25 MCG/ACT Aepb Commonly known as: BREO ELLIPTA Inhale 1 puff into the lungs daily. Start taking on: December 16, 2021   HYDROcodone-acetaminophen 5-325 MG tablet Commonly known as: NORCO/VICODIN Take 1 tablet by mouth every 8 (eight) hours as needed for moderate pain. What changed:  when to take this reasons to take this   ketoconazole 2 % shampoo Commonly known as: NIZORAL Apply 1 application  topically 2 (two) times a week. Apply to scalp (shampoo) topically every day shift every Tuesday and Friday for tinea versicolor (bath days)   lactulose 10 GM/15ML solution Commonly known as: CHRONULAC Take 15 mLs by mouth daily as needed for mild constipation.   levothyroxine 175 MCG tablet Commonly known as: SYNTHROID Take 175 mcg by mouth daily before breakfast.   OXYGEN Inhale 2 L into the lungs continuous.   polyethylene glycol 17 g packet Commonly known as: MIRALAX / GLYCOLAX Take 17 g by  mouth daily.   potassium chloride SA 20 MEQ tablet Commonly known as: KLOR-CON M Take 20 mEq by mouth daily.   pramipexole 1 MG tablet Commonly known as: MIRAPEX Take 1 mg by mouth at bedtime.   predniSONE 20 MG tablet Commonly known as: DELTASONE Take 2 tablets (40 mg total) by mouth daily with breakfast for 5 days.   sennosides-docusate sodium 8.6-50 MG tablet Commonly known as: SENOKOT-S Take 2 tablets by mouth at bedtime.   spironolactone 50 MG tablet Commonly known as: ALDACTONE Take 50 mg by mouth daily.   tamsulosin 0.4 MG Caps capsule Commonly known as: FLOMAX Take 1 capsule (0.4 mg total) by mouth daily after supper. What changed: when to take this   Torsemide 40 MG Tabs Take 40 mg by mouth See admin instructions. Take 60 mg every morning and 40 mg every evening What changed:  when to take this additional instructions        Major procedures and Radiology Reports - PLEASE review detailed and final reports for all details, in brief -   DG Chest 2 View  Result Date: 12/15/2021 CLINICAL DATA:  Dyspnea and respiratory abnormalities. EXAM: CHEST - 2 VIEW COMPARISON:  Chest radiograph 2 days prior FINDINGS: The heart is enlarged, unchanged. The upper mediastinal contours are stable. There is vascular congestion without definite pulmonary interstitial edema, not significantly changed. Left basilar/retrocardiac opacities are not significantly changed and likely reflect a small pleural effusion and adjacent atelectasis/consolidation, likely not significantly changed. There is no significant right effusion. There is no new or worsening focal airspace disease. There is no pneumothorax The bones are stable. IMPRESSION: Overall stable exam with cardiomegaly and vascular congestion but no definite overt pulmonary edema, and probable small left pleural effusion and adjacent atelectasis/consolidation. Electronically Signed   By: Valetta Mole M.D.   On: 12/15/2021 10:13   DG Chest  1 View  Result Date: 12/13/2021 CLINICAL DATA:  81 year old male with shortness of breath. EXAM: CHEST  1 VIEW COMPARISON:  Chest radiographs 12/11/2021. FINDINGS: Portable AP semi upright view at 0554 hours. Stable cardiomegaly and mediastinal contours. Elevated left hemidiaphragm 2 days ago,  paucity of bowel gas now compared to that exam. Stable left lung base hypo ventilation. Background pulmonary vascular congestion although doubt overt edema. No pneumothorax or air bronchograms identified. Visualized tracheal air column is within normal limits. No acute osseous abnormality identified. IMPRESSION: Stable cardiomegaly and elevated left hemidiaphragm with left lung base hypo ventilation. Pulmonary vascular congestion without overt edema. Electronically Signed   By: Genevie Ann M.D.   On: 12/13/2021 06:21   DG Chest 2 View  Result Date: 12/11/2021 CLINICAL DATA:  Short of breath. EXAM: CHEST - 2 VIEW COMPARISON:  08/18/2021 and older exams. FINDINGS: Mild enlargement of cardiac silhouette is stable. No mediastinal or hilar masses. Vascular congestion and bilateral interstitial prominence is similar to prior exams, extension weighted by low lung volumes. No convincing pleural effusion and no pneumothorax. Skeletal structures are grossly intact. IMPRESSION: 1. No acute cardiopulmonary disease. Findings similar to prior exams with mild cardiomegaly, vascular congestion chronic interstitial prominence, but no overt pulmonary edema Electronically Signed   By: Lajean Manes M.D.   On: 12/11/2021 12:37    Micro Results   Recent Results (from the past 240 hour(s))  Resp Panel by RT-PCR (Flu A&B, Covid) Anterior Nasal Swab     Status: None   Collection Time: 12/11/21 12:28 PM   Specimen: Anterior Nasal Swab  Result Value Ref Range Status   SARS Coronavirus 2 by RT PCR NEGATIVE NEGATIVE Final    Comment: (NOTE) SARS-CoV-2 target nucleic acids are NOT DETECTED.  The SARS-CoV-2 RNA is generally detectable in  upper respiratory specimens during the acute phase of infection. The lowest concentration of SARS-CoV-2 viral copies this assay can detect is 138 copies/mL. A negative result does not preclude SARS-Cov-2 infection and should not be used as the sole basis for treatment or other patient management decisions. A negative result may occur with  improper specimen collection/handling, submission of specimen other than nasopharyngeal swab, presence of viral mutation(s) within the areas targeted by this assay, and inadequate number of viral copies(<138 copies/mL). A negative result must be combined with clinical observations, patient history, and epidemiological information. The expected result is Negative.  Fact Sheet for Patients:  EntrepreneurPulse.com.au  Fact Sheet for Healthcare Providers:  IncredibleEmployment.be  This test is no t yet approved or cleared by the [place] FDA and  has been authorized for detection and/or diagnosis of SARS-CoV-2 by FDA under an Emergency Use Authorization (EUA). This EUA will remain  in effect (meaning this test can be used) for the duration of the COVID-19 declaration under Section 564(b)(1) of the Act, 21 U.S.C.section 360bbb-3(b)(1), unless the authorization is terminated  or revoked sooner.       Influenza A by PCR NEGATIVE NEGATIVE Final   Influenza B by PCR NEGATIVE NEGATIVE Final    Comment: (NOTE) The Xpert Xpress SARS-CoV-2/FLU/RSV plus assay is intended as an aid in the diagnosis of influenza from Nasopharyngeal swab specimens and should not be used as a sole basis for treatment. Nasal washings and aspirates are unacceptable for Xpert Xpress SARS-CoV-2/FLU/RSV testing.  Fact Sheet for Patients: EntrepreneurPulse.com.au  Fact Sheet for Healthcare Providers: IncredibleEmployment.be  This test is not yet approved or cleared by the [place] FDA and has been  authorized for detection and/or diagnosis of SARS-CoV-2 by FDA under an Emergency Use Authorization (EUA). This EUA will remain in effect (meaning this test can be used) for the duration of the COVID-19 declaration under Section 564(b)(1) of the Act, 21 U.S.C. section 360bbb-3(b)(1), unless the authorization is  terminated or revoked.  Performed at [facility], [address] [place], [place] [address]   MRSA Next Gen by PCR, Nasal     Status: None   Collection Time: 12/12/21  1:52 AM   Specimen: Nasal Mucosa; Nasal Swab  Result Value Ref Range Status   MRSA by PCR Next Gen NOT DETECTED NOT DETECTED Final    Comment: (NOTE) The GeneXpert MRSA Assay (FDA approved for NASAL specimens only), is one component of a comprehensive MRSA colonization surveillance program. It is not intended to diagnose MRSA infection nor to guide or monitor treatment for MRSA infections. Test performance is not FDA approved in patients less than 56 years old. Performed at [facility], [address] [place], [place] [address]   MRSA Next Gen by PCR, Nasal     Status: None   Collection Time: 12/13/21  6:17 AM   Specimen: Nasal Mucosa; Nasal Swab  Result Value Ref Range Status   MRSA by PCR Next Gen NOT DETECTED NOT DETECTED Final    Comment: (NOTE) The GeneXpert MRSA Assay (FDA approved for NASAL specimens only), is one component of a comprehensive MRSA colonization surveillance program. It is not intended to diagnose MRSA infection nor to guide or monitor treatment for MRSA infections. Test performance is not FDA approved in patients less than 76 years old. Performed at [facility], [address], [place], [place] [address]     Today   Subjective    Nived Simonich today has no new complaints  No fever  Or chills  No Nausea, Vomiting or Diarrhea -Dyspnea is improving -Patient again refused BiPAP overnight -Patient states he feels much better he wants to go back to facility            Patient  has been seen and examined prior to discharge   Objective   Blood pressure 133/79, pulse 68, temperature 97.6 F (36.4 C), temperature source Oral, resp. rate (!) 24, height 5\' 10"  (1.778 m), weight 126.3 kg, SpO2 93 %.   Intake/Output Summary (Last 24 hours) at 12/15/2021 1058 Last data filed at 12/15/2021 0900 Gross per 24 hour  Intake --  Output 2450 ml  Net -2450 ml    Exam Gen:-Alert and oriented, no acute distress, no conversational dyspnea at rest HEENT:- [place].AT, No sclera icterus Nose- [place] 2L/min Neck-Supple Neck,No JVD,.  Lungs-Fair movement,, no wheezing  CV- S1, S2 normal, regular  Abd-  +ve B.Sounds, Abd Soft, No tenderness,    Extremity/Skin:-Significant lower extremity edema with venous stasis, pedal pulses present  Psych-affect is appropriate, oriented x3 Neuro-generalized weakness no new focal deficits, no tremors, at baseline patient ambulates with electric wheelchair and manual wheelchair   Data Review   CBC w Diff:  Lab Results  Component Value Date   WBC 7.3 12/15/2021   HGB 16.2 12/15/2021   HCT 54.2 (H) 12/15/2021   PLT 134 (L) 12/15/2021   LYMPHOPCT 20 12/19/2020   MONOPCT 11 12/19/2020   EOSPCT 3 12/19/2020   BASOPCT 1 12/19/2020   CMP:  Lab Results  Component Value Date   NA 139 12/15/2021   K 4.1 12/15/2021   CL 85 (L) 12/15/2021   CO2 44 (H) 12/15/2021   BUN 34 (H) 12/15/2021   CREATININE 0.92 12/15/2021   PROT 6.8 12/11/2021   ALBUMIN 3.4 (L) 12/15/2021   BILITOT 2.2 (H) 12/11/2021   ALKPHOS 105 12/11/2021   AST 21 12/11/2021   ALT 17 12/11/2021  .  Total Discharge time is  about 33 minutes  Roxan Hockey M.D on 12/15/2021 at 10:58 AM  Go to [email or web address] -  for contact info  [facility] - Office  [phone]

## 2021-12-15 NOTE — Discharge Instructions (Signed)
1)You will Benefit from CPAP use at night when you falls asleep-however you have refused CPAP Machine at this time 2)Very low-salt diet advised--low-sodium diet 3)Weigh yourself daily, call if you gain more than 3 pounds in 1 day or more than 5 pounds in 1 week as your diuretic medications may need to be adjusted 4)you need oxygen at home at 2 L via nasal cannula continuously while awake and while asleep--- smoking or having open fires around oxygen can cause fire, significant injury and death

## 2021-12-15 NOTE — Care Management Important Message (Signed)
Important Message  Patient Details  Name: Vernon Campbell MRN: [id number] Date of Birth: 05-Sep-1940   Medicare Important Message Given:  Yes     [name] 12/15/2021, 12:04 PM

## 2021-12-15 NOTE — NC FL2 (Signed)
[place] LEVEL OF CARE SCREENING TOOL     IDENTIFICATION  Patient Name: Vernon Campbell Birthdate: September 29, 1940 Sex: male Admission Date (Current Location): 12/11/2021  [place] and [place] Number:  [place] [facility] [id number] O Facility and Address:  [facility] [address] [place]      Provider Number: [id number]  Attending Physician Name and Address:  Roxan Hockey, MD  Relative Name and Phone Number:  Lijah, [facility])   [phone]    Current Level of Care: Hospital Recommended Level of Care: Skilled Nursing Facility Prior Approval Number:    Date Approved/Denied:   PASRR Number:    Discharge Plan: SNF    Current Diagnoses: Patient Active Problem List   Diagnosis Date Noted   SOB (shortness of breath) 12/11/2021   COPD with acute exacerbation ([place]) 12/11/2021   Hypokalemia 08/20/2021   Acute on chronic diastolic heart failure ([place]) 08/18/2021   Restless leg syndrome 08/18/2021   Chronic respiratory failure with hypoxia ([place]) 08/18/2021   Acute exacerbation of CHF (congestive heart failure) ([place]) 10/01/2020   Hyperlipidemia 10/01/2020   Hypothyroidism 10/01/2020   Macrocytic anemia 10/01/2020   AKI (acute kidney injury) ([place]) 10/01/2020   Obesity, Class III, BMI 40-49.9 (morbid obesity) ([place]) 10/01/2020   Acute on chronic respiratory failure with hypoxia ([place]) 10/01/2020   Right thigh pain 10/01/2020   Right groin pain 10/01/2020   Chronic stasis dermatitis 10/01/2020    Orientation RESPIRATION BLADDER Height & Weight     Self, Situation, Time, Place  O2 (2L) Incontinent Weight: 278 lb 7.1 oz (126.3 kg) Height:  5\' 10"  (177.8 cm)  BEHAVIORAL SYMPTOMS/MOOD NEUROLOGICAL BOWEL NUTRITION STATUS      Incontinent Diet (low sodium)  AMBULATORY STATUS COMMUNICATION OF NEEDS Skin   Limited Assist Verbally Normal                       Personal Care Assistance Level of Assistance  Bathing, Feeding, Dressing Bathing  Assistance: Limited assistance Feeding assistance: Independent Dressing Assistance: Limited assistance     Functional Limitations Info  Sight, Hearing, Speech Sight Info: Adequate Hearing Info: Adequate Speech Info: Adequate    SPECIAL CARE FACTORS FREQUENCY                       Contractures Contractures Info: Not present    Additional Factors Info  Code Status, Allergies Code Status Info: DNR Allergies Info: NKA           Current Medications (12/15/2021):  This is the current hospital active medication list Current Facility-Administered Medications  Medication Dose Route Frequency Provider Last Rate Last Admin   albuterol (PROVENTIL) (2.5 MG/3ML) 0.083% nebulizer solution 2.5 mg  2.5 mg Nebulization Q2H PRN Truett Mainland, DO   2.5 mg at 12/11/21 2013   atorvastatin (LIPITOR) tablet 20 mg  20 mg Oral Daily Truett Mainland, DO   20 mg at 12/15/21 0840   Chlorhexidine Gluconate Cloth 2 % PADS 6 each  6 each Topical Daily Adefeso, Oladapo, DO   6 each at 12/15/21 0840   enoxaparin (LOVENOX) injection 40 mg  40 mg Subcutaneous Q24H Truett Mainland, DO   40 mg at 12/14/21 1919   fluticasone furoate-vilanterol (BREO ELLIPTA) 100-25 MCG/ACT 1 puff  1 puff Inhalation Daily Truett Mainland, DO   1 puff at 12/15/21 0902   furosemide (LASIX) injection 60 mg  60 mg Intravenous Q12H Roxan Hockey, MD   60  mg at 12/15/21 0834   HYDROcodone-acetaminophen (NORCO/VICODIN) 5-325 MG per tablet 1 tablet  1 tablet Oral TID Truett Mainland, DO   1 tablet at 12/15/21 1021   insulin aspart (novoLOG) injection 0-15 Units  0-15 Units Subcutaneous TID WC Truett Mainland, DO   2 Units at 12/15/21 1414   insulin aspart (novoLOG) injection 0-5 Units  0-5 Units Subcutaneous QHS Truett Mainland, DO   3 Units at 12/13/21 2158   levothyroxine (SYNTHROID) tablet 175 mcg  175 mcg Oral QAC breakfast Onnie Boer Q, RPH-CPP   175 mcg at 12/15/21 0530   methylPREDNISolone sodium succinate  (SOLU-MEDROL) 40 mg/mL injection 40 mg  40 mg Intravenous Q12H Emokpae, Courage, MD   40 mg at 12/15/21 0839   ondansetron (ZOFRAN) tablet 4 mg  4 mg Oral Q6H PRN Truett Mainland, DO       Or   ondansetron [facility]) injection 4 mg  4 mg Intravenous Q6H PRN Truett Mainland, DO       Oral care mouth rinse  15 mL Mouth Rinse 4 times per day Roxan Hockey, MD   15 mL at 12/15/21 0840   Oral care mouth rinse  15 mL Mouth Rinse PRN Emokpae, Courage, MD       polyethylene glycol (MIRALAX / GLYCOLAX) packet 17 g  17 g Oral Daily Pham, Minh Q, RPH-CPP   17 g at 12/12/21 1032   potassium chloride SA (KLOR-CON M) CR tablet 20 mEq  20 mEq Oral Daily Pham, Minh Q, RPH-CPP   20 mEq at 12/15/21 7412   pramipexole (MIRAPEX) tablet 1 mg  1 mg Oral QHS Truett Mainland, DO   1 mg at 12/14/21 2122   senna-docusate (Senokot-S) tablet 2 tablet  2 tablet Oral QHS Truett Mainland, DO   2 tablet at 12/11/21 2140   spironolactone (ALDACTONE) tablet 50 mg  50 mg Oral Daily Truett Mainland, DO   50 mg at 12/15/21 0839   tamsulosin (FLOMAX) capsule 0.4 mg  0.4 mg Oral QPC supper Pham, Minh Q, RPH-CPP   0.4 mg at 12/14/21 1604     Discharge Medications: Please see discharge summary for a list of discharge medications.  Relevant Imaging Results:  Relevant Lab Results:   Additional Information    Nora Sabey, Clydene Pugh, LCSW

## 2022-01-12 ENCOUNTER — Ambulatory Visit: Payer: Medicare Other | Admitting: Physician Assistant

## 2022-01-22 ENCOUNTER — Ambulatory Visit (INDEPENDENT_AMBULATORY_CARE_PROVIDER_SITE_OTHER): Payer: Medicare Other | Admitting: Physician Assistant

## 2022-01-22 ENCOUNTER — Encounter: Payer: Self-pay | Admitting: Physician Assistant

## 2022-01-22 VITALS — BP 84/57 | HR 87 | Wt 272.0 lb

## 2022-01-22 DIAGNOSIS — R350 Frequency of micturition: Secondary | ICD-10-CM

## 2022-01-22 LAB — MICROSCOPIC EXAMINATION
Bacteria, UA: NONE SEEN
RBC, Urine: NONE SEEN /hpf (ref 0–2)

## 2022-01-22 LAB — URINALYSIS, ROUTINE W REFLEX MICROSCOPIC
Bilirubin, UA: NEGATIVE
Glucose, UA: NEGATIVE
Leukocytes,UA: NEGATIVE
Nitrite, UA: NEGATIVE
Protein,UA: NEGATIVE
RBC, UA: NEGATIVE
Specific Gravity, UA: 1.01 (ref 1.005–1.030)
Urobilinogen, Ur: 1 mg/dL (ref 0.2–1.0)
pH, UA: 5 (ref 5.0–7.5)

## 2022-01-22 LAB — BLADDER SCAN AMB NON-IMAGING: Scan Result: 0

## 2022-01-22 NOTE — Progress Notes (Unsigned)
post void residual=0 ?

## 2022-01-22 NOTE — Progress Notes (Unsigned)
Assessment: 1. Urinary frequency - Urinalysis, Routine w reflex microscopic - BLADDER SCAN AMB NON-IMAGING    Plan: Continue tamsulosin  No orders of the defined types were placed in this encounter.    Chief Complaint: No chief complaint on file.   HPI: Vernon Campbell is a 81 y.o. male who resides at [facility] skilled facility who presents for continued evaluation of urinary frequency. Pt states he is doing much better with frequency  UA=clear PVR=68ml IPSS= 13          QOL=0  10/14/21 Vernon Campbell is a 81 y.o. male who presents for continued evaluation of urinary frequency. Pt placed on Flomax at last visit one month ago and states he felt some improvement until he developed worsening edema and had increase in diuretic dose. He has also had an increase in urgency. He is able to make it to the toilet or urinal and denies dysuria, burning, gross hematuria. Stream stronger, but still weak on occasion. Nocturia x 3 since last weeks med change. Pt remains at SNF and ambulates with scooter or wheelchair.  Cardiology eval this week indicates increase in Toremide to 60mg  daily due to increase in fluid retention and worsening CHF. IPSS=16, QOL=5 UA= clear today PVR=60ml  08/28/21 HPI: Vernon Campbell is a 81yo here for evaluation of urinary frequency. For the past 4 years he has noted increased urinary frequency and urgency. IPSS 25 QOL: 4 on no BPH therapy. Urine stream fair. Nocturia 2x. He has multiple small volume voids. PVR 63cc. He has CHF and is on 60mg  of torsemide daily.      Portions of the above documentation were copied from a prior visit for review purposes only.  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)    Age-related physical debility    Aortic stenosis    Carpal tunnel syndrome, right    Chronic cor pulmonale (HCC)    Chronic diastolic heart  failure (HCC)    Chronic respiratory failure with hypoxia (HCC)    Combined forms of age-related cataract, bilateral    Constipation, chronic    Dermatochalasis of eyelid    Extreme obesity with alveolar hypoventilation (HCC)    History of tobacco use    Hyperlipidemia    Hypermetropia, bilateral    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  SH: Social History   Tobacco Use   Smoking status: Former    Types: Cigarettes   Smokeless tobacco: Never  Vaping Use   Vaping Use: Never used  Substance Use Topics   Alcohol use: Not Currently   Drug use: Not Currently    ROS: All other review of systems were reviewed and are negative except what is  noted above in HPI  PE: There were no vitals taken for this visit. GENERAL APPEARANCE:  Well appearing, well developed, well nourished, NAD HEENT:  Atraumatic, normocephalic NECK:  Supple. Trachea midline ABDOMEN:  Soft, non-tender, no masses EXTREMITIES:  Moves all extremities well, without clubbing, cyanosis, or edema NEUROLOGIC:  Alert and oriented x 3, normal gait, CN II-XII grossly intact MENTAL STATUS:  appropriate BACK:  Non-tender to palpation, No CVAT SKIN:  Warm, dry, and intact   Results: Laboratory Data: Lab Results  Component Value Date   WBC 7.3 12/15/2021   HGB 16.2 12/15/2021   HCT 54.2 (H) 12/15/2021   MCV 103.0 (H) 12/15/2021   PLT 134 (L)  12/15/2021    Lab Results  Component Value Date   CREATININE 0.92 12/15/2021    No results found for: "PSA"  No results found for: "TESTOSTERONE"  Lab Results  Component Value Date   HGBA1C 5.5 12/12/2021    Urinalysis    Component Value Date/Time   APPEARANCEUR Clear 10/14/2021 1110   GLUCOSEU Negative 10/14/2021 1110   BILIRUBINUR Negative 10/14/2021 1110   PROTEINUR Negative 10/14/2021 1110   NITRITE Negative 10/14/2021 1110   LEUKOCYTESUR Negative 10/14/2021 1110    Lab Results  Component Value Date   LABMICR Comment 10/14/2021    Pertinent Imaging: No results found for this or any previous visit.  No results found for this or any previous visit.  No results found for this or any previous visit.  No results found for this or any previous visit.  No results found for this or any previous visit.  No valid procedures specified. No results found for this or any previous visit.  No results found for this or any previous visit.  No results found for this or any previous visit (from the past 24 hour(s)).

## 2022-01-27 ENCOUNTER — Encounter: Payer: Self-pay | Admitting: Physician Assistant

## 2022-03-29 NOTE — Progress Notes (Deleted)
Cardiology Office Note    Date:  03/29/2022   ID:  Vernon Campbell, DOB 29-Mar-1940, MRN [id number]  PCP:  System, Provider Not In  Cardiologist:  Charlton Haws, MD  Electrophysiologist:  None   Chief Complaint: ***  History of Present Illness:   Vernon Campbell is a 82 y.o. 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   He was initially seen by Dr. Eden Emms in 10/2020 for aortic stenosis coming off of a hospitalization in 09/2020 for acute on chronic hypoxic respiratory failure requiring 3-4L home O2 at discharge. IM felt this was due to a/c HFpEF. Dr. Eden Emms referred to this as severe COPD and potential aspiration. He felt the edema was more likely related to obesity and chronic venous disease rather than heart failure. He was not felt to require intervention for his mild-moderate AS at the time. He had another admission 08/2021 with worsening edema, SOB, and orthopnea with weight gain, felt due to a/c diastolic CHF. He was diuresed with IV Lasix and metolazone at the time with dc weight 264. However, his CO2 was significantly elevated at >45 at discharge, though had been mildly elevated at the past. Chloride was also low at 73. Therefore it's possible that the 264 actually reflects an overdiuresis of the intravascular system. 2D Echo 08/19/21 showed EF 65-70%, G1DD, mildly dilated LA, moderate AS, normal RV. At follow-up 10/08/21 he was at 303 on our scale and reported 290 at [facility]. At that time he was on torsemide and spironolactone but also had reported receiving intermittent Lasix dosing as well (?). Torsemide was increased from 60mg  daily to 40mg  BID with repeat BMET/BNP in 7-10 days but not performed in  our system. He was seen at follow-up 11/2021 and weight had improved to 288lb so continued on present regimen. I had offered to refer to pulmonology but patient declined.  The patient was subsequently admitted 11/2021 with acute on chronic hypoxic and hypercapneic respiratory failure. Medicine team felt this was due to a/c HFpEF and AECOPD. Per notes,"patient is at risk for respiratory decompensation due to CO2 retention so patient judicious with oxygen to avoid further CO2 retention.Patient no longer wants to use BiPAP--- he is refusing to be evaluated to see if he qualifies for CPAP/BiPAP." Repeat ABG while on [place] O2 showed chronic hypercapnia felt to be compensated.  He was discharged at 278lb following diuresis and required dc to SNF.  Repeat echo  Chronic lymphedema/chronic HFpEF Moderate aortic stenosis Essential HTN RBBB Thrombocytopenia   Labwork independently reviewed: 11/2021 K 4.1, CO2 44, Cr 0.92, BN 34, albumin 3.4, Hgb 16.2, plt 134, BNP 65, A1C 5.5, d-dimer neg, trop neg 07/2021 TSH wnl  Cardiology Studies:   Studies reviewed are outlined and summarized above. Reports included below if pertinent.   2D echo 08/19/21   1. Left ventricular ejection fraction, by estimation, is 65 to 70%. The  left ventricle has normal function. The left ventricle has no regional  wall motion abnormalities. There is moderate left ventricular hypertrophy.  Left ventricular diastolic  parameters are consistent with Grade I diastolic dysfunction (impaired  relaxation).   2. Right ventricular systolic function is normal. The right ventricular  size is normal. Tricuspid regurgitation signal is inadequate for assessing  PA pressure.  3. Left atrial size was mildly dilated.   4. The mitral valve is normal in structure. No evidence of mitral valve  regurgitation. No evidence of mitral stenosis.   5. The aortic valve is tricuspid. Aortic valve regurgitation is not  visualized. Moderate aortic valve  stenosis.     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  Current Medications: No outpatient medications have been marked as taking for the 03/30/22 encounter (Appointment) with Charlie Pitter, PA-C.   ***   Allergies:   Patient has no known allergies.   Social History   Socioeconomic History   Marital status: Single    Spouse name: Not on file   Number of children: Not on file   Years of education: Not on file   Highest education level: Not on file  Occupational History   Not on file  Tobacco Use   Smoking status: Former    Types: Cigarettes   Smokeless tobacco: Never  Vaping Use   Vaping Use: Never used  Substance and Sexual Activity   Alcohol use: Not Currently   Drug use: Not Currently   Sexual activity: Not Currently  Other Topics  Concern   Not on file  Social History Narrative   Not on file   Social Determinants of Health   Financial Resource Strain: Not on file  Food Insecurity: No Food Insecurity (12/12/2021)   Hunger Vital Sign    Worried About Running Out of Food in the Last Year: Never true    Ran Out of Food in the Last Year: Never true  Transportation Needs: No Transportation Needs (12/12/2021)   PRAPARE - Hydrologist (Medical): No    Lack of Transportation (Non-Medical): No  Physical Activity: Not on file  Stress: Not on file  Social Connections: Not on file     Family History:  The patient's ***family history is not on file.  ROS:   Please see the history of present illness. Otherwise, review of systems is positive for ***.  All other systems are reviewed and otherwise negative.    EKG(s)/Additional Labs   EKG:  EKG is ordered today, personally reviewed,  demonstrating ***  Recent Labs: 08/18/2021: TSH 1.108 08/21/2021: Magnesium 2.0 12/11/2021: ALT 17 12/15/2021: B Natriuretic Peptide 65.0; BUN 34; Creatinine, Ser 0.92; Hemoglobin 16.2; Platelets 134; Potassium 4.1; Sodium 139  Recent Lipid Panel No results found for: "CHOL", "TRIG", "HDL", "CHOLHDL", "VLDL", "LDLCALC", "LDLDIRECT"  PHYSICAL EXAM:    VS:  There were no vitals taken for this visit.  BMI: There is no height or weight on file to calculate BMI.  GEN: Well nourished, well developed male in no acute distress HEENT: normocephalic, atraumatic Neck: no JVD, carotid bruits, or masses Cardiac: ***RRR; no murmurs, rubs, or gallops, no edema  Respiratory:  clear to auscultation bilaterally, normal work of breathing GI: soft, nontender, nondistended, + BS MS: no deformity or atrophy Skin: warm and dry, no rash Neuro:  Alert and Oriented x 3, Strength and sensation are intact, follows commands Psych: euthymic mood, full affect  Wt Readings from Last 3 Encounters:  01/22/22 272 lb (123.4 kg)  12/14/21  278 lb 7.1 oz (126.3 kg)  11/26/21 288 lb (130.6 kg)     ASSESSMENT & PLAN:   ***     Disposition: F/u with ***   Medication Adjustments/Labs and Tests Ordered: Current medicines are reviewed at length with the patient today.  Concerns regarding medicines are outlined above. Medication changes, Labs and Tests ordered today are summarized above and listed in the Patient Instructions accessible in Encounters.    Signed, Laurann Montana, PA-C  03/29/2022 2:23 PM    [facility] - [place] Location in [facility] [address] [place], [place] [address] Ph: [phone]; Fax [phone]

## 2022-03-30 ENCOUNTER — Ambulatory Visit: Payer: Medicare Other | Admitting: Physician Assistant

## 2022-03-30 DIAGNOSIS — D696 Thrombocytopenia, unspecified: Secondary | ICD-10-CM

## 2022-03-30 DIAGNOSIS — I10 Essential (primary) hypertension: Secondary | ICD-10-CM

## 2022-03-30 DIAGNOSIS — I890 Lymphedema, not elsewhere classified: Secondary | ICD-10-CM

## 2022-03-30 DIAGNOSIS — I4510 Unspecified right bundle-branch block: Secondary | ICD-10-CM

## 2022-03-30 DIAGNOSIS — I350 Nonrheumatic aortic (valve) stenosis: Secondary | ICD-10-CM

## 2022-03-30 DIAGNOSIS — I5032 Chronic diastolic (congestive) heart failure: Secondary | ICD-10-CM

## 2022-05-06 ENCOUNTER — Encounter: Payer: Self-pay | Admitting: Medical

## 2022-05-06 ENCOUNTER — Ambulatory Visit: Payer: Medicare Other | Attending: Physician Assistant | Admitting: Medical

## 2022-05-06 VITALS — BP 102/56 | HR 64 | Ht 70.0 in | Wt 282.0 lb

## 2022-05-06 DIAGNOSIS — I4510 Unspecified right bundle-branch block: Secondary | ICD-10-CM

## 2022-05-06 DIAGNOSIS — I890 Lymphedema, not elsewhere classified: Secondary | ICD-10-CM | POA: Insufficient documentation

## 2022-05-06 DIAGNOSIS — J449 Chronic obstructive pulmonary disease, unspecified: Secondary | ICD-10-CM | POA: Insufficient documentation

## 2022-05-06 DIAGNOSIS — I5032 Chronic diastolic (congestive) heart failure: Secondary | ICD-10-CM | POA: Insufficient documentation

## 2022-05-06 DIAGNOSIS — I10 Essential (primary) hypertension: Secondary | ICD-10-CM | POA: Diagnosis present

## 2022-05-06 DIAGNOSIS — I350 Nonrheumatic aortic (valve) stenosis: Secondary | ICD-10-CM | POA: Diagnosis present

## 2022-05-06 NOTE — Patient Instructions (Signed)
Medication Instructions:  Your physician recommends that you continue on your current medications as directed. Please refer to the Current Medication list given to you today.  *If you need a refill on your cardiac medications before your next appointment, please call your pharmacy*   Lab Work: NONE   If you have labs (blood work) drawn today and your tests are completely normal, you will receive your results only by: [place] (if you have MyChart) OR A paper copy in the mail If you have any lab test that is abnormal or we need to change your treatment, we will call you to review the results.   Testing/Procedures: NONE    Follow-Up: At [facility], you and your health needs are our priority.  As part of our continuing mission to provide you with exceptional heart care, we have created designated Provider Care Teams.  These Care Teams include your primary Cardiologist (physician) and Advanced Practice Providers (APPs -  Physician Assistants and Nurse Practitioners) who all work together to provide you with the care you need, when you need it.  We recommend signing up for the patient portal called "MyChart".  Sign up information is provided on this After Visit Summary.  MyChart is used to connect with patients for Virtual Visits (Telemedicine).  Patients are able to view lab/test results, encounter notes, upcoming appointments, etc.  Non-urgent messages can be sent to your provider as well.   To learn more about what you can do with MyChart, go to [email or web address].    Your next appointment:   4 month(s)  Provider:   You may see Jenkins Rouge, MD or one of the following Advanced Practice Providers on your designated Care Team:   Bernerd Pho, PA-C  Ermalinda Barrios, PA-C     Other Instructions Thank you for choosing [facility]!

## 2022-05-06 NOTE — Progress Notes (Signed)
Cardiology Office Note:    Date:  05/06/2022   ID:  Vernon Campbell, DOB 18-May-1940, MRN [id number]  PCP:  System, Provider Not In  [place] Cardiologist:  Jenkins Rouge, MD  [place] Electrophysiologist:  None   Referring MD: No ref. provider found   Chief Complaint: 4 month follow-up  History of Present Illness:    Vernon Campbell is a 82 y.o. 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   He was initially seen by Dr. Johnsie Cancel 10/2020 for aortic stenosis coming off a hospitalization in 09/2020 for acute on chronic hypoxic respiratory failure requiring 3-4L O2 at discharge. IM felt this was due to a/c HFpEF. Dr. Glade Lloyd felt it was more COPD and potential aspiration. He felt the edema was more likely related to obesity and chronic venous disease rather than heart failure. He was not felt to require intervention for his mild to mod AS at the time. He had another admission 08/2021 with worsening edema, SOB, and orthopnea with weight gain, felt to be a/c diastolic CHF. He was diuresed with IV lasix and metolazone at the time with dc weight 264. However, his CO2 was significantly elevated at >45 at discharge, though had been mildly elevated at the past. Chloride was also low at 73. Therefore it's possible that the 264 actually reflects an overdiuresis of the intravascular system. Echo 07/2021 showed LVEF 65-70%, G1DD, mildly dilated LA, moderate AS, normal RV. At follow-up 09/2021 he was at 303 lbs and 290lbs at [facility]. At that time he was on Torsemide and spironolactone, but also reported receiving intermittent lasix dosing as well. Torsemide was increased from 67m to 414mBID.   He was last seen 11/2021 and was overall doing OK. Weight went  from 303>288lbs. No changes were made.   Today, the patinet is overall doing well. He says he urinates a lot. He takes torsemide 4038mID and spiro. He denies chest pain or shortness of breath. He has chronic and unchanged swelling on his feet, today is has wraps. He does walk sometimes, but is mostly sedentary. Weight is down to 282lbs.   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 No pertinent surgical history.  Current Medications: Current Meds  Medication Sig   albuterol (PROVENTIL) (2.5 MG/3ML) 0.083% nebulizer solution Take 3 mLs (2.5 mg total) by nebulization every 2 (two) hours as needed for shortness of breath.   atorvastatin (LIPITOR) 20 MG tablet Take 20 mg by mouth daily.   fluticasone furoate-vilanterol (BREO ELLIPTA) 100-25 MCG/ACT AEPB Inhale 1 puff into the  lungs daily.   ketoconazole (NIZORAL) 2 % shampoo Apply 1 application  topically 2 (two) times a week. Apply to scalp (shampoo) topically every day shift every Tuesday and Friday for tinea versicolor (bath days)   lactulose (CHRONULAC) 10 GM/15ML solution Take 15 mLs by mouth daily as needed for mild constipation.   levothyroxine (SYNTHROID) 175 MCG tablet Take 175 mcg by mouth daily before breakfast.   OXYGEN Inhale 2 L into the lungs continuous.   polyethylene glycol (MIRALAX / GLYCOLAX) 17 g packet Take 17 g by mouth daily.   potassium chloride SA (KLOR-CON) 20 MEQ tablet Take 20 mEq by mouth daily.   pramipexole (MIRAPEX) 1 MG tablet Take 1 mg by mouth at bedtime.   sennosides-docusate sodium (SENOKOT-S) 8.6-50 MG tablet Take 2 tablets by mouth at bedtime.   spironolactone (ALDACTONE) 50 MG tablet Take 50 mg by mouth daily.   tamsulosin (FLOMAX) 0.4 MG CAPS capsule Take 1 capsule (0.4 mg total) by mouth daily after supper. (Patient taking differently: Take 0.4 mg by mouth daily.)   Torsemide 40 MG TABS Take 40 mg by mouth See admin instructions. Take 60 mg every morning and 40 mg every evening     Allergies:   Patient has no known allergies.   Social History   Socioeconomic History   Marital status: Single    Spouse name: Not on file   Number of children: Not on file   Years of education: Not on file   Highest education level: Not on file  Occupational History   Not on file  Tobacco Use   Smoking status: Former    Types: Cigarettes   Smokeless tobacco: Never  Vaping Use   Vaping Use: Never used  Substance and Sexual Activity   Alcohol use: Not Currently   Drug use: Not Currently   Sexual activity: Not Currently  Other Topics Concern   Not on file  Social History Narrative   Not on file   Social Determinants of Health   Financial Resource Strain: Not on file  Food Insecurity: No Food Insecurity (12/12/2021)   Hunger Vital Sign    Worried About Running Out of Food in  the Last Year: Never true    Ran Out of Food in the Last Year: Never true  Transportation Needs: No Transportation Needs (12/12/2021)   PRAPARE - Hydrologist (Medical): No    Lack of Transportation (Non-Medical): No  Physical Activity: Not on file  Stress: Not on file  Social Connections: Not on file     Family History: The patient's family history is not on file.  ROS:   Please see the history of present illness.     All other systems reviewed and are negative.  EKGs/Labs/Other Studies Reviewed:    The following studies were reviewed today:  Echo 07/2021 1. Left ventricular ejection fraction, by estimation, is 65 to 70%. The  left  ventricle has normal function. The left ventricle has no regional  wall motion abnormalities. There is moderate left ventricular hypertrophy.  Left ventricular diastolic  parameters are consistent with Grade I diastolic dysfunction (impaired  relaxation).   2. Right ventricular systolic function is normal. The right ventricular  size is normal. Tricuspid regurgitation signal is inadequate for assessing  PA pressure.   3. Left atrial size was mildly dilated.   4. The mitral valve is normal in structure. No evidence of mitral valve  regurgitation. No evidence of mitral stenosis.   5. The aortic valve is tricuspid. Aortic valve regurgitation is not  visualized. Moderate aortic valve stenosis.   Echo 09/2020  1. Left ventricular ejection fraction, by estimation, is 55 to 60%. The  left ventricle has normal function. Left ventricular endocardial border  not optimally defined to evaluate regional wall motion. There is moderate  left ventricular hypertrophy. Left  ventricular diastolic parameters are consistent with Grade I diastolic  dysfunction (impaired relaxation).   2. Right ventricular systolic function was not well visualized. The right  ventricular size is not well visualized.   3. Left atrial size was mildly  dilated.   4. The mitral valve is normal in structure. No evidence of mitral valve  regurgitation. No evidence of mitral stenosis.   5. The aortic valve has an indeterminant number of cusps. There is severe  calcifcation of the aortic valve. There is severe thickening of the aortic  valve. Aortic valve regurgitation is not visualized. Mild to moderate  aortic valve stenosis. Aortic  valve mean gradient measures 17.2 mmHg. Aortic valve peak gradient  measures 27.6 mmHg. Aortic valve area, by VTI measures 1.49 cm.    EKG:  EKG is ordered today.    Recent Labs: 08/18/2021: TSH 1.108 08/21/2021: Magnesium 2.0 12/11/2021: ALT 17 12/15/2021: B Natriuretic Peptide 65.0; BUN 34; Creatinine, Ser 0.92; Hemoglobin 16.2; Platelets 134; Potassium 4.1; Sodium 139  Recent Lipid Panel No results found for: "CHOL", "TRIG", "HDL", "CHOLHDL", "VLDL", "LDLCALC", "LDLDIRECT"   Physical Exam:    VS:  BP (!) 102/56   Pulse 64   Ht 5' 10"$  (1.778 m)   Wt 282 lb (127.9 kg)   SpO2 91%   BMI 40.46 kg/m     Wt Readings from Last 3 Encounters:  05/06/22 282 lb (127.9 kg)  01/22/22 272 lb (123.4 kg)  12/14/21 278 lb 7.1 oz (126.3 kg)     GEN:  Well nourished, well developed in no acute distress HEENT: Normal NECK: No JVD; No carotid bruits LYMPHATICS: No lymphadenopathy CARDIAC: RRR, + murmur, no rubs, gallops RESPIRATORY:  Clear to auscultation without rales, wheezing or rhonchi  ABDOMEN: Soft, non-tender, non-distended MUSCULOSKELETAL:  No edema; No deformity  SKIN: Warm and dry NEUROLOGIC:  Alert and oriented x 3 PSYCHIATRIC:  Normal affect   ASSESSMENT:    1. Lymphedema   2. Chronic diastolic heart failure ([place])   3. Moderate aortic stenosis   4. Essential hypertension   5. Chronic obstructive pulmonary disease, unspecified COPD type ([place])   6. RBBB    PLAN:    In order of problems listed above:  Chronic lymphedema Venous Insufficiency HFpEF Overall patient appears stable. He has  stable chronic lower leg edema and chronic respiratory failure. He has leg wraps that are changed twice weekly. It's felt edema was more likely lymphedema in the setting of weight/venous insufficiency.  He is taking Torsemide 86m BID with spironolactone 551mdaily and supplemental potassium. No changes today  Moderate AS Can repeat echo at follow-up in 07/2022.  HTN BP is soft, continue current medications  COPD with chronic respiratory failure He uses O2 at night when he goes to bed.   RBBB No symptoms to suggest bradycardia.   Disposition: Follow up in 4 month(s) with MD/APP     Signed, Raunel Dimartino Ninfa Meeker, PA-C  05/06/2022 4:25 PM    [facility]

## 2022-06-17 ENCOUNTER — Other Ambulatory Visit: Payer: Self-pay

## 2022-06-17 ENCOUNTER — Emergency Department (HOSPITAL_COMMUNITY): Payer: Medicare Other

## 2022-06-17 ENCOUNTER — Emergency Department (HOSPITAL_COMMUNITY)
Admission: EM | Admit: 2022-06-17 | Discharge: 2022-06-17 | Disposition: A | Discharge status: Home / Self Care | Payer: Medicare Other | Attending: Emergency Medicine | Admitting: Emergency Medicine

## 2022-06-17 ENCOUNTER — Encounter (HOSPITAL_COMMUNITY): Payer: Self-pay

## 2022-06-17 DIAGNOSIS — I872 Venous insufficiency (chronic) (peripheral): Secondary | ICD-10-CM | POA: Insufficient documentation

## 2022-06-17 DIAGNOSIS — R600 Localized edema: Secondary | ICD-10-CM

## 2022-06-17 DIAGNOSIS — R7309 Other abnormal glucose: Secondary | ICD-10-CM | POA: Insufficient documentation

## 2022-06-17 LAB — CBC WITH DIFFERENTIAL/PLATELET
Abs Immature Granulocytes: 0.08 10*3/uL — ABNORMAL HIGH (ref 0.00–0.07)
Basophils Absolute: 0.1 10*3/uL (ref 0.0–0.1)
Basophils Relative: 1 %
Eosinophils Absolute: 0.1 10*3/uL (ref 0.0–0.5)
Eosinophils Relative: 1 %
HCT: 50.9 % (ref 39.0–52.0)
Hemoglobin: 16.1 g/dL (ref 13.0–17.0)
Immature Granulocytes: 1 %
Lymphocytes Relative: 19 %
Lymphs Abs: 1.8 10*3/uL (ref 0.7–4.0)
MCH: 31.9 pg (ref 26.0–34.0)
MCHC: 31.6 g/dL (ref 30.0–36.0)
MCV: 101 fL — ABNORMAL HIGH (ref 80.0–100.0)
Monocytes Absolute: 0.7 10*3/uL (ref 0.1–1.0)
Monocytes Relative: 8 %
Neutro Abs: 6.5 10*3/uL (ref 1.7–7.7)
Neutrophils Relative %: 70 %
Platelets: 159 10*3/uL (ref 150–400)
RBC: 5.04 MIL/uL (ref 4.22–5.81)
RDW: 14.5 % (ref 11.5–15.5)
WBC: 9.3 10*3/uL (ref 4.0–10.5)
nRBC: 0 % (ref 0.0–0.2)

## 2022-06-17 LAB — COMPREHENSIVE METABOLIC PANEL
ALT: 17 U/L (ref 0–44)
AST: 22 U/L (ref 15–41)
Albumin: 3.7 g/dL (ref 3.5–5.0)
Alkaline Phosphatase: 105 U/L (ref 38–126)
Anion gap: 8 (ref 5–15)
BUN: 30 mg/dL — ABNORMAL HIGH (ref 8–23)
CO2: 32 mmol/L (ref 22–32)
Calcium: 8.7 mg/dL — ABNORMAL LOW (ref 8.9–10.3)
Chloride: 96 mmol/L — ABNORMAL LOW (ref 98–111)
Creatinine, Ser: 1.28 mg/dL — ABNORMAL HIGH (ref 0.61–1.24)
GFR, Estimated: 55 mL/min — ABNORMAL LOW (ref >60)
Glucose, Bld: 114 mg/dL — ABNORMAL HIGH (ref 70–99)
Potassium: 4.2 mmol/L (ref 3.5–5.1)
Sodium: 136 mmol/L (ref 135–145)
Total Bilirubin: 1.7 mg/dL — ABNORMAL HIGH (ref 0.3–1.2)
Total Protein: 6.6 g/dL (ref 6.5–8.1)

## 2022-06-17 LAB — BRAIN NATRIURETIC PEPTIDE: B Natriuretic Peptide: 34 pg/mL (ref 0.0–100.0)

## 2022-06-17 LAB — CBG MONITORING, ED: Glucose-Capillary: 128 mg/dL — ABNORMAL HIGH (ref 70–99)

## 2022-06-17 MED ORDER — FUROSEMIDE 10 MG/ML IJ SOLN
40.0000 mg | Freq: Once | INTRAMUSCULAR | Status: AC
  Administered 2022-06-17: 40 mg via INTRAVENOUS
  Filled 2022-06-17: qty 4

## 2022-06-17 NOTE — ED Provider Notes (Signed)
[facility] Provider Note   CSN: [id number] Arrival date & time: 06/17/22  1515     History  Chief Complaint  Patient presents with   Shortness of Breath    Vernon Campbell is a 82 y.o. male.  Patient has a history of edema in his legs.  Patient has a history of pulmonary hypertension  The history is provided by the patient and medical records. No language interpreter was used.  Shortness of Breath Severity:  Mild Onset quality:  Gradual Timing:  Intermittent Progression:  Waxing and waning Chronicity:  Recurrent Context: activity   Relieved by:  Nothing Worsened by:  Nothing Ineffective treatments:  None tried Associated symptoms: no abdominal pain, no chest pain, no cough, no headaches and no rash        Home Medications Prior to Admission medications   Medication Sig Start Date End Date Taking? Authorizing Provider  albuterol (PROVENTIL) (2.5 MG/3ML) 0.083% nebulizer solution Take 3 mLs (2.5 mg total) by nebulization every 2 (two) hours as needed for shortness of breath. 12/15/21   Roxan Hockey, MD  atorvastatin (LIPITOR) 20 MG tablet Take 20 mg by mouth daily.    [provider]  fluticasone furoate-vilanterol (BREO ELLIPTA) 100-25 MCG/ACT AEPB Inhale 1 puff into the lungs daily. 12/16/21   Roxan Hockey, MD  HYDROcodone-acetaminophen (NORCO/VICODIN) 5-325 MG tablet Take 1 tablet by mouth every 8 (eight) hours as needed for moderate pain. 12/15/21   Roxan Hockey, MD  ketoconazole (NIZORAL) 2 % shampoo Apply 1 application  topically 2 (two) times a week. Apply to scalp (shampoo) topically every day shift every Tuesday and Friday for tinea versicolor (bath days)    [provider]  lactulose (CHRONULAC) 10 GM/15ML solution Take 15 mLs by mouth daily as needed for mild constipation.    [provider]  levothyroxine (SYNTHROID) 175 MCG tablet Take 175 mcg by mouth daily before breakfast.     [provider]  OXYGEN Inhale 2 L into the lungs continuous.    [provider]  polyethylene glycol (MIRALAX / GLYCOLAX) 17 g packet Take 17 g by mouth daily.    [provider]  potassium chloride SA (KLOR-CON) 20 MEQ tablet Take 20 mEq by mouth daily.    [provider]  pramipexole (MIRAPEX) 1 MG tablet Take 1 mg by mouth at bedtime.    [provider]  sennosides-docusate sodium (SENOKOT-S) 8.6-50 MG tablet Take 2 tablets by mouth at bedtime.    [provider]  spironolactone (ALDACTONE) 50 MG tablet Take 50 mg by mouth daily.    [provider]  tamsulosin (FLOMAX) 0.4 MG CAPS capsule Take 1 capsule (0.4 mg total) by mouth daily after supper. Patient taking differently: Take 0.4 mg by mouth daily. 08/28/21   McKenzie, Candee Furbish, MD  Torsemide 40 MG TABS Take 40 mg by mouth See admin instructions. Take 60 mg every morning and 40 mg every evening 12/15/21   Roxan Hockey, MD      Allergies    Patient has no known allergies.    Review of Systems   Review of Systems  Constitutional:  Negative for appetite change and fatigue.  HENT:  Negative for congestion, ear discharge and sinus pressure.   Eyes:  Negative for discharge.  Respiratory:  Positive for shortness of breath. Negative for cough.   Cardiovascular:  Negative for chest pain.  Gastrointestinal:  Negative for abdominal pain and diarrhea.  Genitourinary:  Negative  for frequency and hematuria.  Musculoskeletal:  Negative for back pain.  Skin:  Negative for rash.  Neurological:  Negative for seizures and headaches.  Psychiatric/Behavioral:  Negative for hallucinations.     Physical Exam Updated Vital Signs BP 133/74   Pulse 83   Temp 98.5 F (36.9 C) (Oral)   Resp (!) 23   Ht 5\' 10"  (1.778 m)   Wt 131.1 kg   SpO2 95%   BMI 41.47 kg/m  Physical Exam Vitals and nursing note reviewed.  Constitutional:      Appearance: He is well-developed.  HENT:      Head: Normocephalic.     Mouth/Throat:     Mouth: Mucous membranes are moist.  Eyes:     General: No scleral icterus.    Conjunctiva/sclera: Conjunctivae normal.  Neck:     Thyroid: No thyromegaly.  Cardiovascular:     Rate and Rhythm: Normal rate and regular rhythm.     Heart sounds: No murmur heard.    No friction rub. No gallop.  Pulmonary:     Breath sounds: No stridor. No wheezing or rales.  Chest:     Chest wall: No tenderness.  Abdominal:     General: There is no distension.     Tenderness: There is no abdominal tenderness. There is no rebound.  Musculoskeletal:        General: Normal range of motion.     Cervical back: Neck supple.     Comments: Edema in lower extremities  Lymphadenopathy:     Cervical: No cervical adenopathy.  Skin:    Findings: No erythema or rash.  Neurological:     Mental Status: He is alert and oriented to person, place, and time.     Motor: No abnormal muscle tone.     Coordination: Coordination normal.  Psychiatric:        Behavior: Behavior normal.     ED Results / Procedures / Treatments   Labs (all labs ordered are listed, but only abnormal results are displayed) Labs Reviewed  CBC WITH DIFFERENTIAL/PLATELET - Abnormal; Notable for the following components:      Result Value   MCV 101.0 (*)    Abs Immature Granulocytes 0.08 (*)    All other components within normal limits  COMPREHENSIVE METABOLIC PANEL - Abnormal; Notable for the following components:   Chloride 96 (*)    Glucose, Bld 114 (*)    BUN 30 (*)    Creatinine, Ser 1.28 (*)    Calcium 8.7 (*)    Total Bilirubin 1.7 (*)    GFR, Estimated 55 (*)    All other components within normal limits  CBG MONITORING, ED - Abnormal; Notable for the following components:   Glucose-Capillary 128 (*)    All other components within normal limits  BRAIN NATRIURETIC PEPTIDE    EKG None  Radiology DG Chest Port 1 View  Result Date: 06/17/2022 CLINICAL DATA:  CHF, fluid  overload, weight gain, increased shortness of breath EXAM: PORTABLE CHEST 1 VIEW COMPARISON:  Portable exam 1619 hours compared to 12/15/2021 FINDINGS: Enlargement of cardiac silhouette with pulmonary vascular congestion. Chronic elevation of LEFT diaphragm. Minimal chronic accentuation of interstitial markings. No acute pulmonary infiltrate or edema. Trace LEFT pleural effusion. No pneumothorax or acute osseous findings. IMPRESSION: Enlargement of cardiac silhouette with pulmonary vascular congestion. Trace LEFT pleural effusion without acute pulmonary edema. Electronically Signed   By: Lavonia Dana M.D.   On: 06/17/2022 16:29    Procedures Procedures  Medications Ordered in ED Medications  furosemide (LASIX) injection 40 mg (40 mg Intravenous Given 06/17/22 1808)    ED Course/ Medical Decision Making/ A&P                             Medical Decision Making Amount and/or Complexity of Data Reviewed Labs: ordered. Radiology: ordered. ECG/medicine tests: ordered.  Risk Prescription drug management.   This patient presents to the ED for concern of swelling in legs, this involves an extensive number of treatment options, and is a complaint that carries with it a high risk of complications and morbidity.  The differential diagnosis includes congestive heart failure, cellulitis   Co morbidities that complicate the patient evaluation  Pulmonary hypertension   Additional history obtained:  Additional history obtained from patient External records from outside source obtained and reviewed including hospital records   Lab Tests:  I Ordered, and personally interpreted labs.  The pertinent results include: Creatinine 1.3   Imaging Studies ordered:  I ordered imaging studies including chest x-ray I independently visualized and interpreted imaging which showed trace left pleural effusion I agree with the radiologist interpretation   Cardiac Monitoring: / EKG:  The patient was  maintained on a cardiac monitor.  I personally viewed and interpreted the cardiac monitored which showed an underlying rhythm of: Normal sinus rhythm   Consultations Obtained:  No consultant  Problem List / ED Course / Critical interventions / Medication management  Pulmonary hypertension and leg edema I ordered medication including Lasix for edema Reevaluation of the patient after these medicines showed that the patient improved I have reviewed the patients home medicines and have made adjustments as needed   Social Determinants of Health:  None   Test / Admission - Considered:  None   Patient with peripheral edema that improved with Lasix.  We will increase his torsemide so he is taking 60 mg twice a day and he will follow-up with his doctor        Final Clinical Impression(s) / ED Diagnoses Final diagnoses:  Edema of both lower legs due to peripheral venous insufficiency    Rx / DC Orders ED Discharge Orders     None         Milton Ferguson, MD 06/19/22 1122

## 2022-06-17 NOTE — Discharge Instructions (Signed)
Increase your torsemide so you are taking 60 mg twice a day.  Follow-up with your doctor next week

## 2022-06-17 NOTE — ED Triage Notes (Addendum)
Pt arrives via [facility] from [place] for CHF, fluid overload, weight gain, and increased sob, pt wears 2L oxygen at all times, pt was 93-95% on 2L at facility. Pt able to speak full sentences, no respiratory distress. Pt denies increased sob, says he has been like this for a couple of years. Dr Roderic Palau at bedside.

## 2022-07-05 ENCOUNTER — Emergency Department (HOSPITAL_COMMUNITY): Payer: Medicare Other

## 2022-07-05 ENCOUNTER — Inpatient Hospital Stay (HOSPITAL_COMMUNITY)
Admission: EM | Admit: 2022-07-05 | Discharge: 2022-07-07 | DRG: 291 | Disposition: A | Discharge status: Skilled Nursing Facility | Payer: Medicare Other | Source: Skilled Nursing Facility | Attending: Internal Medicine | Admitting: Internal Medicine

## 2022-07-05 ENCOUNTER — Encounter (HOSPITAL_COMMUNITY): Payer: Self-pay | Admitting: Emergency Medicine

## 2022-07-05 ENCOUNTER — Other Ambulatory Visit: Payer: Self-pay

## 2022-07-05 ENCOUNTER — Inpatient Hospital Stay (HOSPITAL_COMMUNITY): Payer: Medicare Other

## 2022-07-05 DIAGNOSIS — I5033 Acute on chronic diastolic (congestive) heart failure: Secondary | ICD-10-CM

## 2022-07-05 DIAGNOSIS — Z66 Do not resuscitate: Secondary | ICD-10-CM | POA: Diagnosis present

## 2022-07-05 DIAGNOSIS — Z1152 Encounter for screening for COVID-19: Secondary | ICD-10-CM | POA: Diagnosis not present

## 2022-07-05 DIAGNOSIS — I872 Venous insufficiency (chronic) (peripheral): Secondary | ICD-10-CM | POA: Diagnosis present

## 2022-07-05 DIAGNOSIS — J9621 Acute and chronic respiratory failure with hypoxia: Secondary | ICD-10-CM | POA: Diagnosis present

## 2022-07-05 DIAGNOSIS — J189 Pneumonia, unspecified organism: Secondary | ICD-10-CM

## 2022-07-05 DIAGNOSIS — G2581 Restless legs syndrome: Secondary | ICD-10-CM | POA: Diagnosis present

## 2022-07-05 DIAGNOSIS — E785 Hyperlipidemia, unspecified: Secondary | ICD-10-CM | POA: Diagnosis present

## 2022-07-05 DIAGNOSIS — R0609 Other forms of dyspnea: Secondary | ICD-10-CM | POA: Diagnosis not present

## 2022-07-05 DIAGNOSIS — I110 Hypertensive heart disease with heart failure: Secondary | ICD-10-CM | POA: Diagnosis present

## 2022-07-05 DIAGNOSIS — J9601 Acute respiratory failure with hypoxia: Secondary | ICD-10-CM | POA: Diagnosis not present

## 2022-07-05 DIAGNOSIS — J449 Chronic obstructive pulmonary disease, unspecified: Secondary | ICD-10-CM | POA: Diagnosis not present

## 2022-07-05 DIAGNOSIS — E039 Hypothyroidism, unspecified: Secondary | ICD-10-CM | POA: Diagnosis present

## 2022-07-05 DIAGNOSIS — I509 Heart failure, unspecified: Secondary | ICD-10-CM

## 2022-07-05 DIAGNOSIS — Z9981 Dependence on supplemental oxygen: Secondary | ICD-10-CM

## 2022-07-05 DIAGNOSIS — Z79890 Hormone replacement therapy (postmenopausal): Secondary | ICD-10-CM | POA: Diagnosis not present

## 2022-07-05 DIAGNOSIS — I480 Paroxysmal atrial fibrillation: Secondary | ICD-10-CM | POA: Diagnosis present

## 2022-07-05 DIAGNOSIS — I5032 Chronic diastolic (congestive) heart failure: Secondary | ICD-10-CM

## 2022-07-05 DIAGNOSIS — E782 Mixed hyperlipidemia: Secondary | ICD-10-CM

## 2022-07-05 DIAGNOSIS — I2729 Other secondary pulmonary hypertension: Secondary | ICD-10-CM | POA: Diagnosis present

## 2022-07-05 DIAGNOSIS — Z89421 Acquired absence of other right toe(s): Secondary | ICD-10-CM

## 2022-07-05 DIAGNOSIS — J441 Chronic obstructive pulmonary disease with (acute) exacerbation: Secondary | ICD-10-CM | POA: Diagnosis present

## 2022-07-05 DIAGNOSIS — Z79899 Other long term (current) drug therapy: Secondary | ICD-10-CM | POA: Diagnosis not present

## 2022-07-05 DIAGNOSIS — R0600 Dyspnea, unspecified: Principal | ICD-10-CM

## 2022-07-05 DIAGNOSIS — Z87891 Personal history of nicotine dependence: Secondary | ICD-10-CM

## 2022-07-05 DIAGNOSIS — R0902 Hypoxemia: Secondary | ICD-10-CM

## 2022-07-05 LAB — CBC
HCT: 52.6 % — ABNORMAL HIGH (ref 39.0–52.0)
Hemoglobin: 15.9 g/dL (ref 13.0–17.0)
MCH: 30.9 pg (ref 26.0–34.0)
MCHC: 30.2 g/dL (ref 30.0–36.0)
MCV: 102.1 fL — ABNORMAL HIGH (ref 80.0–100.0)
Platelets: 172 10*3/uL (ref 150–400)
RBC: 5.15 MIL/uL (ref 4.22–5.81)
RDW: 15 % (ref 11.5–15.5)
WBC: 11.1 10*3/uL — ABNORMAL HIGH (ref 4.0–10.5)
nRBC: 0 % (ref 0.0–0.2)

## 2022-07-05 LAB — BASIC METABOLIC PANEL
Anion gap: 6 (ref 5–15)
BUN: 17 mg/dL (ref 8–23)
CO2: 36 mmol/L — ABNORMAL HIGH (ref 22–32)
Calcium: 8.2 mg/dL — ABNORMAL LOW (ref 8.9–10.3)
Chloride: 94 mmol/L — ABNORMAL LOW (ref 98–111)
Creatinine, Ser: 1.07 mg/dL (ref 0.61–1.24)
GFR, Estimated: >60 mL/min (ref >60)
Glucose, Bld: 107 mg/dL — ABNORMAL HIGH (ref 70–99)
Potassium: 4 mmol/L (ref 3.5–5.1)
Sodium: 136 mmol/L (ref 135–145)

## 2022-07-05 LAB — TROPONIN I (HIGH SENSITIVITY)
Troponin I (High Sensitivity): 13 ng/L (ref <18)
Troponin I (High Sensitivity): 13 ng/L (ref <18)

## 2022-07-05 LAB — RESP PANEL BY RT-PCR (RSV, FLU A&B, COVID)  RVPGX2
Influenza A by PCR: NEGATIVE
Influenza B by PCR: NEGATIVE
Resp Syncytial Virus by PCR: NEGATIVE
SARS Coronavirus 2 by RT PCR: NEGATIVE

## 2022-07-05 LAB — PROCALCITONIN: Procalcitonin: <0.1 ng/mL

## 2022-07-05 LAB — ECHOCARDIOGRAM COMPLETE
AR max vel: 1.12 cm2
AV Area VTI: 1.09 cm2
AV Area mean vel: 1.12 cm2
AV Mean grad: 20.5 mmHg
AV Peak grad: 31.2 mmHg
Ao pk vel: 2.8 m/s
Area-P 1/2: 2.54 cm2
Height: 70 in
MV M vel: 0.68 m/s
MV Peak grad: 1.9 mmHg
S' Lateral: 3.2 cm
Weight: 4567.93 oz

## 2022-07-05 LAB — BRAIN NATRIURETIC PEPTIDE: B Natriuretic Peptide: 32 pg/mL (ref 0.0–100.0)

## 2022-07-05 MED ORDER — OXYCODONE HCL 5 MG PO TABS
5.0000 mg | ORAL_TABLET | ORAL | Status: DC | PRN
  Administered 2022-07-06 – 2022-07-07 (×3): 5 mg via ORAL
  Filled 2022-07-05 (×3): qty 1

## 2022-07-05 MED ORDER — LEVOTHYROXINE SODIUM 75 MCG PO TABS
175.0000 ug | ORAL_TABLET | Freq: Every day | ORAL | Status: DC
  Administered 2022-07-05 – 2022-07-07 (×3): 175 ug via ORAL
  Filled 2022-07-05 (×3): qty 1

## 2022-07-05 MED ORDER — HEPARIN SODIUM (PORCINE) 5000 UNIT/ML IJ SOLN
5000.0000 [IU] | Freq: Three times a day (TID) | INTRAMUSCULAR | Status: DC
  Administered 2022-07-05 – 2022-07-07 (×7): 5000 [IU] via SUBCUTANEOUS
  Filled 2022-07-05 (×7): qty 1

## 2022-07-05 MED ORDER — PREDNISONE 20 MG PO TABS
40.0000 mg | ORAL_TABLET | Freq: Every day | ORAL | Status: DC
  Administered 2022-07-06 – 2022-07-07 (×2): 40 mg via ORAL
  Filled 2022-07-05 (×2): qty 2

## 2022-07-05 MED ORDER — PERFLUTREN LIPID MICROSPHERE
1.0000 mL | INTRAVENOUS | Status: AC | PRN
  Administered 2022-07-05: 4 mL via INTRAVENOUS

## 2022-07-05 MED ORDER — ONDANSETRON HCL 4 MG/2ML IJ SOLN: 4.0000 mg | Freq: Four times a day (QID) | INTRAMUSCULAR | Status: DC | PRN

## 2022-07-05 MED ORDER — FUROSEMIDE 10 MG/ML IJ SOLN: 40.0000 mg | Freq: Once | INTRAMUSCULAR | Status: DC

## 2022-07-05 MED ORDER — SODIUM CHLORIDE 3 % IN NEBU: 4.0000 mL | INHALATION_SOLUTION | Freq: Once | RESPIRATORY_TRACT | Status: DC

## 2022-07-05 MED ORDER — ACETAMINOPHEN 325 MG PO TABS: 650.0000 mg | ORAL_TABLET | Freq: Four times a day (QID) | ORAL | Status: DC | PRN

## 2022-07-05 MED ORDER — FLUTICASONE FUROATE-VILANTEROL 100-25 MCG/ACT IN AEPB: 1.0000 | INHALATION_SPRAY | Freq: Every day | RESPIRATORY_TRACT | Status: DC

## 2022-07-05 MED ORDER — IPRATROPIUM-ALBUTEROL 0.5-2.5 (3) MG/3ML IN SOLN
3.0000 mL | Freq: Once | RESPIRATORY_TRACT | Status: AC
  Administered 2022-07-05: 3 mL via RESPIRATORY_TRACT
  Filled 2022-07-05: qty 3

## 2022-07-05 MED ORDER — LACTULOSE 10 GM/15ML PO SOLN: 10.0000 g | Freq: Every day | ORAL | Status: DC | PRN

## 2022-07-05 MED ORDER — DM-GUAIFENESIN ER 30-600 MG PO TB12
1.0000 | ORAL_TABLET | Freq: Two times a day (BID) | ORAL | Status: DC
  Administered 2022-07-05 – 2022-07-07 (×5): 1 via ORAL
  Filled 2022-07-05 (×5): qty 1

## 2022-07-05 MED ORDER — FUROSEMIDE 10 MG/ML IJ SOLN
40.0000 mg | Freq: Once | INTRAMUSCULAR | Status: AC
  Administered 2022-07-05: 40 mg via INTRAVENOUS
  Filled 2022-07-05: qty 4

## 2022-07-05 MED ORDER — PANTOPRAZOLE SODIUM 40 MG PO TBEC
40.0000 mg | DELAYED_RELEASE_TABLET | Freq: Every day | ORAL | Status: DC
  Administered 2022-07-05 – 2022-07-07 (×3): 40 mg via ORAL
  Filled 2022-07-05 (×3): qty 1

## 2022-07-05 MED ORDER — ONDANSETRON HCL 4 MG PO TABS: 4.0000 mg | ORAL_TABLET | Freq: Four times a day (QID) | ORAL | Status: DC | PRN

## 2022-07-05 MED ORDER — SODIUM CHLORIDE 0.9 % IV SOLN
500.0000 mg | INTRAVENOUS | Status: DC
  Administered 2022-07-05 – 2022-07-06 (×2): 500 mg via INTRAVENOUS
  Filled 2022-07-05 (×2): qty 5

## 2022-07-05 MED ORDER — SPIRONOLACTONE 25 MG PO TABS
50.0000 mg | ORAL_TABLET | Freq: Every day | ORAL | Status: DC
  Administered 2022-07-05 – 2022-07-07 (×3): 50 mg via ORAL
  Filled 2022-07-05 (×3): qty 2

## 2022-07-05 MED ORDER — SODIUM CHLORIDE 0.9 % IV SOLN
2.0000 g | INTRAVENOUS | Status: DC
  Administered 2022-07-06: 2 g via INTRAVENOUS
  Filled 2022-07-05: qty 20

## 2022-07-05 MED ORDER — ALBUTEROL SULFATE (2.5 MG/3ML) 0.083% IN NEBU
2.5000 mg | INHALATION_SOLUTION | RESPIRATORY_TRACT | Status: DC | PRN
  Administered 2022-07-06: 2.5 mg via RESPIRATORY_TRACT
  Filled 2022-07-05: qty 3

## 2022-07-05 MED ORDER — ARFORMOTEROL TARTRATE 15 MCG/2ML IN NEBU
15.0000 ug | INHALATION_SOLUTION | Freq: Two times a day (BID) | RESPIRATORY_TRACT | Status: DC
  Administered 2022-07-05 – 2022-07-07 (×5): 15 ug via RESPIRATORY_TRACT
  Filled 2022-07-05 (×5): qty 2

## 2022-07-05 MED ORDER — SODIUM CHLORIDE 0.9 % IV SOLN
1.0000 g | Freq: Once | INTRAVENOUS | Status: AC
  Administered 2022-07-05: 1 g via INTRAVENOUS
  Filled 2022-07-05: qty 10

## 2022-07-05 MED ORDER — BUDESONIDE 0.5 MG/2ML IN SUSP
0.5000 mg | Freq: Two times a day (BID) | RESPIRATORY_TRACT | Status: DC
  Administered 2022-07-05 – 2022-07-07 (×5): 0.5 mg via RESPIRATORY_TRACT
  Filled 2022-07-05 (×5): qty 2

## 2022-07-05 MED ORDER — FUROSEMIDE 10 MG/ML IJ SOLN
40.0000 mg | Freq: Every day | INTRAMUSCULAR | Status: DC
  Administered 2022-07-05 – 2022-07-07 (×3): 40 mg via INTRAVENOUS
  Filled 2022-07-05 (×3): qty 4

## 2022-07-05 MED ORDER — PRAMIPEXOLE DIHYDROCHLORIDE 1 MG PO TABS
1.0000 mg | ORAL_TABLET | Freq: Every day | ORAL | Status: DC
  Administered 2022-07-05 – 2022-07-06 (×2): 1 mg via ORAL
  Filled 2022-07-05 (×2): qty 1

## 2022-07-05 MED ORDER — ACETAMINOPHEN 650 MG RE SUPP: 650.0000 mg | Freq: Four times a day (QID) | RECTAL | Status: DC | PRN

## 2022-07-05 MED ORDER — METHYLPREDNISOLONE SODIUM SUCC 125 MG IJ SOLR
125.0000 mg | Freq: Once | INTRAMUSCULAR | Status: AC
  Administered 2022-07-05: 125 mg via INTRAVENOUS
  Filled 2022-07-05: qty 2

## 2022-07-05 MED ORDER — ATORVASTATIN CALCIUM 20 MG PO TABS
20.0000 mg | ORAL_TABLET | Freq: Every day | ORAL | Status: DC
  Administered 2022-07-05 – 2022-07-07 (×3): 20 mg via ORAL
  Filled 2022-07-05 (×3): qty 1

## 2022-07-05 MED ORDER — GUAIFENESIN ER 600 MG PO TB12: 600.0000 mg | ORAL_TABLET | Freq: Two times a day (BID) | ORAL | Status: DC

## 2022-07-05 NOTE — Progress Notes (Signed)
Patient's sat was 96% on 5L, going to try to wean patient down to 4L to see how he tolerates.   Patient uses 2L baseline at [facility]

## 2022-07-05 NOTE — ED Notes (Signed)
ED TO INPATIENT HANDOFF REPORT  ED Nurse Name and Phone #:   S Name/Age/Gender Vernon Campbell 82 y.o. male Room/Bed: APA10/APA10  Code Status   Code Status: DNR  Home/SNF/Other Nursing Home Patient oriented to: self, place, time, and situation Is this baseline? Yes   Triage Complete: Triage complete  Chief Complaint Acute respiratory failure with hypoxia [J96.01]  Triage Note Pt BIB [facility] from [facility]. Pt c/o increased shortness of breath r/t excess fluid. Staff reported pt is non complaint with O2 and fluid intake.   Pt usually wears 3L [place] at facility. Pt arrives tonight on NRB.    Allergies No Known Allergies  Level of Care/Admitting Diagnosis ED Disposition     ED Disposition  Admit   Condition  --   Comment  Hospital Area: [facility] [[id number]]  Level of Care: Telemetry [5]  Covid Evaluation: Confirmed COVID Negative  Diagnosis: Acute respiratory failure with hypoxia [865784]  Admitting Physician: Lilyan Gilford [[id number]]  Attending Physician: Lilyan Gilford [[id number]]  Certification:: I certify this patient will need inpatient services for at least 2 midnights  Estimated Length of Stay: 2          B Medical/Surgery History Past Medical History:  Diagnosis Date   Abnormal weight gain    Abnormality of gait    Acquired absence of other right toe(s)    Age-related physical debility    Aortic stenosis    Carpal tunnel syndrome, right    Chronic cor pulmonale    Chronic diastolic heart failure    Chronic respiratory failure with hypoxia    Combined forms of age-related cataract, bilateral    Constipation, chronic    Dermatochalasis of eyelid    Extreme obesity with alveolar hypoventilation    History of tobacco use    Hyperlipidemia    Hypermetropia, bilateral    Hypertensive heart disease with congestive heart failure    Hypokalemia    Hypothyroidism, adult    Ileus    Leg pain, diffuse    Lymphedema    Muscle weakness  (generalized)    Obstructive sleep apnea syndrome    Osteoarthritis, unspecified osteoarthritis type, unspecified site    Other seborrheic keratosis    Paroxysmal atrial fibrillation    Peripheral venous insufficiency    Pulmonary hypertension    Recurrent major depression    Restless leg syndrome    Vitamin D deficiency, unspecified    Vitreous degeneration of left eye    History reviewed. No pertinent surgical history.   A IV Location/Drains/Wounds Patient Lines/Drains/Airways Status     Active Line/Drains/Airways     Name Placement date Placement time Site Days   Peripheral IV 07/05/22 18 G 1" Right Wrist 07/05/22  0130  Wrist  less than 1   External Urinary Catheter 06/17/22  1840  --  18   External Urinary Catheter 07/05/22  0132  --  less than 1            Intake/Output Last 24 hours No intake or output data in the 24 hours ending 07/05/22 0418  Labs/Imaging Results for orders placed or performed during the hospital encounter of 07/05/22 (from the past 48 hour(s))  Basic metabolic panel     Status: Abnormal   Collection Time: 07/05/22  1:27 AM  Result Value Ref Range   Sodium 136 135 - 145 mmol/L   Potassium 4.0 3.5 - 5.1 mmol/L   Chloride 94 (L) 98 - 111 mmol/L   CO2  36 (H) 22 - 32 mmol/L   Glucose, Bld 107 (H) 70 - 99 mg/dL    Comment: Glucose reference range applies only to samples taken after fasting for at least 8 hours.   BUN 17 8 - 23 mg/dL   Creatinine, Ser 1.61 0.61 - 1.24 mg/dL   Calcium 8.2 (L) 8.9 - 10.3 mg/dL   GFR, Estimated >09 >60 mL/min    Comment: (NOTE) Calculated using the CKD-EPI Creatinine Equation (2021)    Anion gap 6 5 - 15    Comment: Performed at [facility], [address] [place], [place] [address]  CBC     Status: Abnormal   Collection Time: 07/05/22  1:27 AM  Result Value Ref Range   WBC 11.1 (H) 4.0 - 10.5 K/uL   RBC 5.15 4.22 - 5.81 MIL/uL   Hemoglobin 15.9 13.0 - 17.0 g/dL   HCT 81.1 (H) 91.4 - 78.2 %   MCV 102.1  (H) 80.0 - 100.0 fL   MCH 30.9 26.0 - 34.0 pg   MCHC 30.2 30.0 - 36.0 g/dL   RDW 95.6 21.3 - 08.6 %   Platelets 172 150 - 400 K/uL   nRBC 0.0 0.0 - 0.2 %    Comment: Performed at [facility], [address], [place], [place] [address]  Brain natriuretic peptide     Status: None   Collection Time: 07/05/22  1:27 AM  Result Value Ref Range   B Natriuretic Peptide 32.0 0.0 - 100.0 pg/mL    Comment: Performed at [facility], [address], [place], [place] [address]  Troponin I (High Sensitivity)     Status: None   Collection Time: 07/05/22  1:27 AM  Result Value Ref Range   Troponin I (High Sensitivity) 13 <18 ng/L    Comment: (NOTE) Elevated high sensitivity troponin I (hsTnI) values and significant  changes across serial measurements may suggest ACS but many other  chronic and acute conditions are known to elevate hsTnI results.  Refer to the "Links" section for chest pain algorithms and additional  guidance. Performed at [facility], [address], [place], [place] [address]   Resp panel by RT-PCR (RSV, Flu A&B, Covid) Anterior Nasal Swab     Status: None   Collection Time: 07/05/22  1:55 AM   Specimen: Anterior Nasal Swab  Result Value Ref Range   SARS Coronavirus 2 by RT PCR NEGATIVE NEGATIVE    Comment: (NOTE) SARS-CoV-2 target nucleic acids are NOT DETECTED.  The SARS-CoV-2 RNA is generally detectable in upper respiratory specimens during the acute phase of infection. The lowest concentration of SARS-CoV-2 viral copies this assay can detect is 138 copies/mL. A negative result does not preclude SARS-Cov-2 infection and should not be used as the sole basis for treatment or other patient management decisions. A negative result may occur with  improper specimen collection/handling, submission of specimen other than nasopharyngeal swab, presence of viral mutation(s) within the areas targeted by this assay, and inadequate number of viral copies(<138 copies/mL). A negative  result must be combined with clinical observations, patient history, and epidemiological information. The expected result is Negative.  Fact Sheet for Patients:  BloggerCourse.com  Fact Sheet for Healthcare Providers:  SeriousBroker.it  This test is no t yet approved or cleared by the [place] FDA and  has been authorized for detection and/or diagnosis of SARS-CoV-2 by FDA under an Emergency Use Authorization (EUA). This EUA will remain  in effect (meaning this test can be used) for the  duration of the COVID-19 declaration under Section 564(b)(1) of the Act, 21 U.S.C.section 360bbb-3(b)(1), unless the authorization is terminated  or revoked sooner.       Influenza A by PCR NEGATIVE NEGATIVE   Influenza B by PCR NEGATIVE NEGATIVE    Comment: (NOTE) The Xpert Xpress SARS-CoV-2/FLU/RSV plus assay is intended as an aid in the diagnosis of influenza from Nasopharyngeal swab specimens and should not be used as a sole basis for treatment. Nasal washings and aspirates are unacceptable for Xpert Xpress SARS-CoV-2/FLU/RSV testing.  Fact Sheet for Patients: BloggerCourse.com  Fact Sheet for Healthcare Providers: SeriousBroker.it  This test is not yet approved or cleared by the [place] FDA and has been authorized for detection and/or diagnosis of SARS-CoV-2 by FDA under an Emergency Use Authorization (EUA). This EUA will remain in effect (meaning this test can be used) for the duration of the COVID-19 declaration under Section 564(b)(1) of the Act, 21 U.S.C. section 360bbb-3(b)(1), unless the authorization is terminated or revoked.     Resp Syncytial Virus by PCR NEGATIVE NEGATIVE    Comment: (NOTE) Fact Sheet for Patients: BloggerCourse.com  Fact Sheet for Healthcare Providers: SeriousBroker.it  This test is not yet  approved or cleared by the [place] FDA and has been authorized for detection and/or diagnosis of SARS-CoV-2 by FDA under an Emergency Use Authorization (EUA). This EUA will remain in effect (meaning this test can be used) for the duration of the COVID-19 declaration under Section 564(b)(1) of the Act, 21 U.S.C. section 360bbb-3(b)(1), unless the authorization is terminated or revoked.  Performed at [facility], [address], [place], [place] [address]   Troponin I (High Sensitivity)     Status: None   Collection Time: 07/05/22  3:20 AM  Result Value Ref Range   Troponin I (High Sensitivity) 13 <18 ng/L    Comment: (NOTE) Elevated high sensitivity troponin I (hsTnI) values and significant  changes across serial measurements may suggest ACS but many other  chronic and acute conditions are known to elevate hsTnI results.  Refer to the "Links" section for chest pain algorithms and additional  guidance. Performed at [facility], [address], [place], [place] [address]    DG Chest Portable 1 View  Result Date: 07/05/2022 CLINICAL DATA:  Extremely short of breath EXAM: PORTABLE CHEST 1 VIEW COMPARISON:  Radiograph 06/17/2022 FINDINGS: Stable marked enlargement of the cardiomediastinal silhouette. Prominent pulmonary vascularity may be due to low lung volumes or congestion. Retrocardiac atelectasis or pneumonia. Probable small left pleural effusion. No pneumothorax. IMPRESSION: 1. Stable marked enlargement of the cardiomediastinal silhouette. Prominent pulmonary vascularity may be due to low lung volumes or congestion. 2. Retrocardiac atelectasis or pneumonia. Electronically Signed   By: Minerva Fester M.D.   On: 07/05/2022 01:46    Pending Labs [employer] (From admission, onward)     Start     Ordered   Signed and Held  Procalcitonin  Add-on,   R       References:    Procalcitonin Lower Respiratory Tract Infection AND Sepsis Procalcitonin Algorithm   Signed and Held    Signed and Held  Expectorated Sputum Assessment w Gram Stain, Rflx to Resp Cult  (COPD / Pneumonia / Cellulitis / Lower Extremity Wound)  Once,   R        Signed and Held   Signed and Held  Legionella Pneumophila Serogp 1 Ur Ag  (COPD / Pneumonia / Cellulitis / Lower Extremity Wound)  Once,   R  Signed and Held   Signed and Held  Strep pneumoniae urinary antigen  (COPD / Pneumonia / Cellulitis / Lower Extremity Wound)  Once,   R        Signed and Held            Vitals/Pain Today's Vitals   07/05/22 0300 07/05/22 0330 07/05/22 0400 07/05/22 0410  BP: (!) 140/74 104/65 114/66   Pulse: 89 90 86 83  Resp: 19 (!) 27 15 (!) 22  Temp:      TempSrc:      SpO2: 93% 92% 90% 92%  Weight:      Height:      PainSc:        Isolation Precautions No active isolations  Medications Medications  cefTRIAXone (ROCEPHIN) 1 g in sodium chloride 0.9 % 100 mL IVPB (1 g Intravenous New Bag/Given 07/05/22 0402)  azithromycin (ZITHROMAX) 500 mg in sodium chloride 0.9 % 250 mL IVPB (has no administration in time range)  furosemide (LASIX) injection 40 mg (40 mg Intravenous Given 07/05/22 0201)  ipratropium-albuterol (DUONEB) 0.5-2.5 (3) MG/3ML nebulizer solution 3 mL (3 mLs Nebulization Given 07/05/22 0201)  methylPREDNISolone sodium succinate (SOLU-MEDROL) 125 mg/2 mL injection 125 mg (125 mg Intravenous Given 07/05/22 0401)    Mobility walks with device     Focused Assessments    R Recommendations: See Admitting Provider Note  Report given to:   Additional Notes:

## 2022-07-05 NOTE — ED Provider Notes (Signed)
[place] EMERGENCY DEPARTMENT AT [facility] Provider Note   CSN: [id number] Arrival date & time: 07/05/22  0114     History  Chief Complaint  Patient presents with   Shortness of Breath    Vernon Campbell is a 82 y.o. male.  Patient is an 82 year old male with past medical history of congestive heart failure on oxygen, chronic venous stasis.  Patient sent from his extended care facility for evaluation of difficulty breathing.  He is becoming more short of breath over the past 2 days.  From what I am told by EMS, there may be some compliance issues with his diuretics, but patient denies this.  Patient denies to me he is having any chest pain.  He does report cough with occasional white phlegm production.  No aggravating or alleviating factors.  He was placed on nonrebreather by EMS and transported here.  The history is provided by the patient.       Home Medications Prior to Admission medications   Medication Sig Start Date End Date Taking? Authorizing Provider  albuterol (PROVENTIL) (2.5 MG/3ML) 0.083% nebulizer solution Take 3 mLs (2.5 mg total) by nebulization every 2 (two) hours as needed for shortness of breath. 12/15/21   Shon Hale, MD  atorvastatin (LIPITOR) 20 MG tablet Take 20 mg by mouth daily.    [provider]  fluticasone furoate-vilanterol (BREO ELLIPTA) 100-25 MCG/ACT AEPB Inhale 1 puff into the lungs daily. 12/16/21   Shon Hale, MD  HYDROcodone-acetaminophen (NORCO/VICODIN) 5-325 MG tablet Take 1 tablet by mouth every 8 (eight) hours as needed for moderate pain. 12/15/21   Shon Hale, MD  ketoconazole (NIZORAL) 2 % shampoo Apply 1 application  topically 2 (two) times a week. Apply to scalp (shampoo) topically every day shift every Tuesday and Friday for tinea versicolor (bath days)    [provider]  lactulose (CHRONULAC) 10 GM/15ML solution Take 15 mLs by mouth daily as needed for mild constipation.    [provider]  levothyroxine (SYNTHROID) 175 MCG tablet Take 175 mcg by mouth daily before breakfast.    [provider]  OXYGEN Inhale 2 L into the lungs continuous.    [provider]  polyethylene glycol (MIRALAX / GLYCOLAX) 17 g packet Take 17 g by mouth daily.    [provider]  potassium chloride SA (KLOR-CON) 20 MEQ tablet Take 20 mEq by mouth daily.    [provider]  pramipexole (MIRAPEX) 1 MG tablet Take 1 mg by mouth at bedtime.    [provider]  sennosides-docusate sodium (SENOKOT-S) 8.6-50 MG tablet Take 2 tablets by mouth at bedtime.    [provider]  spironolactone (ALDACTONE) 50 MG tablet Take 50 mg by mouth daily.    [provider]  tamsulosin (FLOMAX) 0.4 MG CAPS capsule Take 1 capsule (0.4 mg total) by mouth daily after supper. Patient taking differently: Take 0.4 mg by mouth daily. 08/28/21   McKenzie, Mardene Celeste, MD  Torsemide 40 MG TABS Take 40 mg by mouth See admin instructions. Take 60 mg every morning and 40 mg every evening 12/15/21   Shon Hale, MD      Allergies    Patient has no known allergies.    Review of Systems   Review of Systems  All other systems reviewed and are negative.   Physical Exam Updated Vital Signs BP 132/77 (BP Location: Right Arm)   Pulse 87   Temp 98.3 F (36.8 C) (Oral)   Resp  16   Ht  (1.778 m)   Wt 131.1 kg   SpO2 97%   BMI 41.47 kg/m  Physical Exam Vitals and nursing note reviewed.  Constitutional:      General: He is not in acute distress.    Appearance: He is well-developed. He is not diaphoretic.  HENT:     Head: Normocephalic and atraumatic.  Cardiovascular:     Rate and Rhythm: Normal rate and regular rhythm.     Heart sounds: No murmur heard.    No friction rub.  Pulmonary:     Effort: Pulmonary effort is normal. No respiratory distress.     Breath sounds: Examination of the right-lower field reveals rales. Examination of the left-lower field  reveals rales. Rales present. No wheezing.  Abdominal:     General: Bowel sounds are normal. There is no distension.     Palpations: Abdomen is soft.     Tenderness: There is no abdominal tenderness.  Musculoskeletal:        General: Normal range of motion.     Cervical back: Normal range of motion and neck supple.     Comments: Patient with bilateral leg wraps in place from below the knee through the foot.  Skin:    General: Skin is warm and dry.  Neurological:     Mental Status: He is alert and oriented to person, place, and time.     Coordination: Coordination normal.     ED Results / Procedures / Treatments   Labs (all labs ordered are listed, but only abnormal results are displayed) Labs Reviewed  CBC - Abnormal; Notable for the following components:      Result Value   WBC 11.1 (*)    HCT 52.6 (*)    MCV 102.1 (*)    All other components within normal limits  RESP PANEL BY RT-PCR (RSV, FLU A&B, COVID)  [id number]  BASIC METABOLIC PANEL  BRAIN NATRIURETIC PEPTIDE  TROPONIN I (HIGH SENSITIVITY)    EKG EKG Interpretation  Date/Time:  Monday July 05 2022 01:32:58 EDT Ventricular Rate:  84 PR Interval:  168 QRS Duration: 141 QT Interval:  397 QTC Calculation: 470 R Axis:   -62 Text Interpretation: Sinus rhythm Atrial premature complexes Right bundle branch block No significant change since 06/17/2022 Confirmed by Geoffery Lyons ([id number]) on 07/05/2022 1:36:16 AM  Radiology No results found.  Procedures Procedures    Medications Ordered in ED Medications  furosemide (LASIX) injection 40 mg (has no administration in time range)  ipratropium-albuterol (DUONEB) 0.5-2.5 (3) MG/3ML nebulizer solution 3 mL (has no administration in time range)    ED Course/ Medical Decision Making/ A&P  Patient is an 82 year old male with history of CHF, COPD.  Patient sent from his extended care facility for evaluation of shortness of breath and increased oxygen requirement.  Patient  normally on 2 L nasal cannula, but began to desaturate.  He required nonrebreather by EMS.  He does report some cough that is intermittently productive.  Patient arrives here with stable vital signs, but oxygen saturations in the low 90s after being weaned to 3 L nasal cannula.  He is afebrile.  On exam, there are rales in both bases.  Workup initiated including CBC, metabolic panel, troponin, respiratory panel, and BNP.  Patient does have a mild leukocytosis, but laboratory studies otherwise unremarkable.  Chest x-ray shows enlargement of the cardiomediastinal silhouette with prominent pulmonary vasculature and also a retrocardiac atelectasis versus pneumonia.  Patient initially given IV Lasix  along with a DuoNeb treatment and steroids.  He had good urine output, but does remain congested with rales on exam.  Due to the chest x-ray findings, I am concerned about the possibility of pneumonia.  He is afebrile, but has a white count of 11,000.  For this reason, Rocephin and Zithromax given.  At this point, I am not confident the patient will do well if return to his ECF.  I have spoken with the hospitalist who agrees to admit.  Final Clinical Impression(s) / ED Diagnoses Final diagnoses:  None    Rx / DC Orders ED Discharge Orders     None         Geoffery Lyons, MD 07/05/22 [phone]

## 2022-07-05 NOTE — Assessment & Plan Note (Signed)
Continue Synthroid °

## 2022-07-05 NOTE — TOC Initial Note (Signed)
Transition of Care [facility]) - Initial/Assessment Note    Patient Details  Name: Vernon Campbell MRN: [id number] Date of Birth: 12/24/1940  [facility] CM/SW Contact:    Elliot Gault, LCSW Phone Number: 07/05/2022, 11:43 AM  Clinical Narrative:                  Pt admitted from [facility]. Plan is for return there at dc. TOC will follow and assist as needed.  Expected Discharge Plan: Long Term Nursing Home Barriers to Discharge: Continued Medical Work up   Patient Goals and CMS Choice Patient states their goals for this hospitalization and ongoing recovery are:: LTC          Expected Discharge Plan and Services In-house Referral: Clinical Social Work     Living arrangements for the past 2 months: Skilled Nursing Facility                                      Prior Living Arrangements/Services Living arrangements for the past 2 months: Skilled Nursing Facility Lives with:: Facility Resident Patient language and need for interpreter reviewed:: Yes Do you feel safe going back to the place where you live?: Yes      Need for Family Participation in Patient Care: No (Comment) Care giver support system in place?: Yes (comment)   Criminal Activity/Legal Involvement Pertinent to Current Situation/Hospitalization: No - Comment as needed  Activities of Daily Living Home Assistive Devices/Equipment: Wheelchair ADL Screening (condition at time of admission) Patient's cognitive ability adequate to safely complete daily activities?: Yes Is the patient deaf or have difficulty hearing?: No Does the patient have difficulty seeing, even when wearing glasses/contacts?: Yes Does the patient have difficulty concentrating, remembering, or making decisions?: No Patient able to express need for assistance with ADLs?: Yes Does the patient have difficulty dressing or bathing?: Yes Independently performs ADLs?: No Communication: Independent Dressing (OT): Needs  assistance Is this a change from baseline?: Pre-admission baseline Grooming: Needs assistance Is this a change from baseline?: Pre-admission baseline Feeding: Independent Bathing: Needs assistance Is this a change from baseline?: Pre-admission baseline Toileting: Needs assistance Is this a change from baseline?: Pre-admission baseline In/Out Bed: Needs assistance Is this a change from baseline?: Pre-admission baseline Walks in Home: Needs assistance Is this a change from baseline?: Pre-admission baseline Does the patient have difficulty walking or climbing stairs?: Yes Weakness of Legs: Both Weakness of Arms/Hands: Both  Permission Sought/Granted                  Emotional Assessment       Orientation: : Oriented to Self, Oriented to Place, Oriented to  Time, Oriented to Situation Alcohol / Substance Use: Not Applicable Psych Involvement: No (comment)  Admission diagnosis:  Hypoxia [R09.02] Acute respiratory failure with hypoxia [J96.01] Dyspnea, unspecified type [R06.00] Community acquired pneumonia, unspecified laterality [J18.9] Patient Active Problem List   Diagnosis Date Noted   Acute respiratory failure with hypoxia 07/05/2022   CHF (congestive heart failure) 07/05/2022   SOB (shortness of breath) 12/11/2021   COPD (chronic obstructive pulmonary disease) 12/11/2021   Hypokalemia 08/20/2021   Acute on chronic diastolic heart failure 08/18/2021   Restless leg syndrome 08/18/2021   Chronic respiratory failure with hypoxia 08/18/2021   Acute exacerbation of CHF (congestive heart failure) 10/01/2020   Hyperlipidemia 10/01/2020   Hypothyroidism 10/01/2020   Macrocytic anemia 10/01/2020   AKI (acute kidney  injury) 10/01/2020   Obesity, Class III, BMI 40-49.9 (morbid obesity) 10/01/2020   Acute on chronic respiratory failure with hypoxia 10/01/2020   Right thigh pain 10/01/2020   Right groin pain 10/01/2020   Chronic stasis dermatitis 10/01/2020   PCP:  System,  Provider Not In Pharmacy:   [facility] - [place], [place] - [address] [place] [address] Phone: [phone] Fax: [phone]     Social Determinants of Health (SDOH) Social History: SDOH Screenings   Food Insecurity: No Food Insecurity (12/12/2021)  Housing: Low Risk  (12/12/2021)  Transportation Needs: No Transportation Needs (12/12/2021)  Utilities: Not At Risk (12/12/2021)  Tobacco Use: Medium Risk (07/05/2022)   SDOH Interventions:     Readmission Risk Interventions    12/14/2021   12:46 PM  Readmission Risk Prevention Plan  Transportation Screening Complete  HRI or Home Care Consult Complete  Social Work Consult for Recovery Care Planning/Counseling Complete  Palliative Care Screening Not Applicable  Medication Review Oceanographer) Complete

## 2022-07-05 NOTE — ED Triage Notes (Signed)
Pt BIB [facility] from [facility]. Pt c/o increased shortness of breath r/t excess fluid. Staff reported pt is non complaint with O2 and fluid intake.   Pt usually wears 3L [place] at facility. Pt arrives tonight on NRB.

## 2022-07-05 NOTE — Assessment & Plan Note (Signed)
-   Presenting with acute exacerbation as mentioned above -Repeat echo with ejection fraction 65-70% and no wall motion abnormalities.  There was mild left ventricle hypertrophy and grade 1 diastolic dysfunction appreciated. -Low-sodium diet, daily weights, strict intake and output and IV diuresis will be continued.

## 2022-07-05 NOTE — Assessment & Plan Note (Signed)
-  With acute exacerbation: -Continue steroids and nebulizer management -Flutter valve has been encouraged -Wean oxygen down to baseline as tolerated -Resume outpatient inhaler management at time of discharge and continue patient follow-up with pulmonology service.

## 2022-07-05 NOTE — H&P (Signed)
History and Physical    Patient: Vernon Campbell ZOX:[id number] DOB: [birth year] DOA: 07/05/2022 DOS: the patient was seen and examined on 07/05/2022 PCP: System, Provider Not In  Patient coming from: SNF  Chief Complaint:  Chief Complaint  Patient presents with   Shortness of Breath   HPI: Vernon Campbell is a 82 y.o. male with medical history significant of hyperlipidemia, hypothyroidism, OSA, paroxysmal atrial fibrillation, pulmonary hypertension, COPD, CHF, and more presents the ER with a chief complaint of dyspnea.  Patient reports that he has had dyspnea that got acutely worse last night.  He reports that he could not catch his breath and his cough was hitting him in spells.  He has had this cough and dyspnea for about 60 days.  The cough is nonproductive.  He reports it feels like there is secretions that he needs to cough up but he cannot get them out.  He has had no hemoptysis.  Patient reports chronic peripheral edema for which he is get his legs wrapped.  He denies any chest pain, palpitations.  He denies any fever.  He does report that his dyspnea is worse on exertion.  He does not have a whole lot of mobility, but even just rolling over in bed he notices the dyspnea is worse.  He has not had any syncope or near syncopal events.  Patient reports a normal appetite.  He has not noticed a decrease in urine output, dysuria, hematuria.  He does wear 2 L nasal cannula at baseline.  He reports that when he first started having dyspnea that is what they prescribed for him.  When he came into the ER last night he was requiring 6 L nasal cannula.  Patient has no other complaints at this time.  Patient does not smoke, does not drink, does not use illicit drugs.  He is vaccinated for COVID.  Patient is DNR with ACP documents on file. Review of Systems: As mentioned in the history of present illness. All other systems reviewed and are negative. Past Medical History:  Diagnosis Date   Abnormal weight gain     Abnormality of gait    Acquired absence of other right toe(s)    Age-related physical debility    Aortic stenosis    Carpal tunnel syndrome, right    Chronic cor pulmonale    Chronic diastolic heart failure    Chronic respiratory failure with hypoxia    Combined forms of age-related cataract, bilateral    Constipation, chronic    Dermatochalasis of eyelid    Extreme obesity with alveolar hypoventilation    History of tobacco use    Hyperlipidemia    Hypermetropia, bilateral    Hypertensive heart disease with congestive heart failure    Hypokalemia    Hypothyroidism, adult    Ileus    Leg pain, diffuse    Lymphedema    Muscle weakness (generalized)    Obstructive sleep apnea syndrome    Osteoarthritis, unspecified osteoarthritis type, unspecified site    Other seborrheic keratosis    Paroxysmal atrial fibrillation    Peripheral venous insufficiency    Pulmonary hypertension    Recurrent major depression    Restless leg syndrome    Vitamin D deficiency, unspecified    Vitreous degeneration of left eye    History reviewed. No pertinent surgical history. Social History:  reports that he has quit smoking. His smoking use included cigarettes. He has never used smokeless tobacco. He reports that he does not currently use alcohol.  He reports that he does not currently use drugs.  No Known Allergies  History reviewed. No pertinent family history.  Prior to Admission medications   Medication Sig Start Date End Date Taking? Authorizing Provider  albuterol (PROVENTIL) (2.5 MG/3ML) 0.083% nebulizer solution Take 3 mLs (2.5 mg total) by nebulization every 2 (two) hours as needed for shortness of breath. 12/15/21   Shon Hale, MD  atorvastatin (LIPITOR) 20 MG tablet Take 20 mg by mouth daily.    [provider]  fluticasone furoate-vilanterol (BREO ELLIPTA) 100-25 MCG/ACT AEPB Inhale 1 puff into the lungs daily. 12/16/21   Shon Hale, MD  HYDROcodone-acetaminophen  (NORCO/VICODIN) 5-325 MG tablet Take 1 tablet by mouth every 8 (eight) hours as needed for moderate pain. 12/15/21   Shon Hale, MD  ketoconazole (NIZORAL) 2 % shampoo Apply 1 application  topically 2 (two) times a week. Apply to scalp (shampoo) topically every day shift every Tuesday and Friday for tinea versicolor (bath days)    [provider]  lactulose (CHRONULAC) 10 GM/15ML solution Take 15 mLs by mouth daily as needed for mild constipation.    [provider]  levothyroxine (SYNTHROID) 175 MCG tablet Take 175 mcg by mouth daily before breakfast.    [provider]  OXYGEN Inhale 2 L into the lungs continuous.    [provider]  polyethylene glycol (MIRALAX / GLYCOLAX) 17 g packet Take 17 g by mouth daily.    [provider]  potassium chloride SA (KLOR-CON) 20 MEQ tablet Take 20 mEq by mouth daily.    [provider]  pramipexole (MIRAPEX) 1 MG tablet Take 1 mg by mouth at bedtime.    [provider]  sennosides-docusate sodium (SENOKOT-S) 8.6-50 MG tablet Take 2 tablets by mouth at bedtime.    [provider]  spironolactone (ALDACTONE) 50 MG tablet Take 50 mg by mouth daily.    [provider]  tamsulosin (FLOMAX) 0.4 MG CAPS capsule Take 1 capsule (0.4 mg total) by mouth daily after supper. Patient taking differently: Take 0.4 mg by mouth daily. 08/28/21   McKenzie, Mardene Celeste, MD  Torsemide 40 MG TABS Take 40 mg by mouth See admin instructions. Take 60 mg every morning and 40 mg every evening 12/15/21   Shon Hale, MD    Physical Exam: Vitals:   07/05/22 0330 07/05/22 0400 07/05/22 0410 07/05/22 0532  BP: 104/65 114/66  126/75  Pulse: 90 86 83 84  Resp: (!) 27 15 (!) 22 20  Temp:    98.3 F (36.8 C)  TempSrc:    Oral  SpO2: 92% 90% 92% 92%  Weight:      Height:       1.  General: Patient lying supine in bed,  no acute distress   2. Psychiatric: Alert and oriented x 3, mood and behavior  normal for situation, pleasant and cooperative with exam   3. Neurologic: Speech and language are normal, face is symmetric, moves all 4 extremities voluntarily, at baseline without acute deficits on limited exam   4. HEENMT:  Head is atraumatic, normocephalic, pupils reactive to light, neck is supple, trachea is midline, mucous membranes are moist   5. Respiratory : Lungs are diminished at the bilateral bases without wheezing, rhonchi, rales, no cyanosis, no increase in work of breathing or accessory muscle use   6. Cardiovascular : Heart rate normal, rhythm is regular, no murmurs, rubs or gallops, lower extremities wrapped, pitting edema in the accessible feet bilaterally peripheral pulses  palpated   7. Gastrointestinal:  Abdomen is soft, nondistended, nontender to palpation bowel sounds active, no masses or organomegaly palpated   8. Skin:  Skin is warm, dry and intact without rashes, acute lesions, or ulcers on limited exam   9.Musculoskeletal:  No acute deformities or trauma, no asymmetry in tone, no peripheral edema, peripheral pulses palpated, no tenderness to palpation in the extremities  Data Reviewed: In the ED Temp 98.3, heart rate 84-90, respiratory rate 16-26, blood pressure 115/77-132/80 Satting at 97% on 6 L, down to 92% on 2 L, maintaining oxygen sats at admission on 3-1/2 Mild leukocytosis at 11.1, hemoglobin 15.9 Chemistry unremarkable Troponin 13, 13 BNP 32 Negative COVID and flu Chest x-ray shows cardiomegaly with vascular congestion and possible retrocardiac pneumonia EKG shows a heart rate of 84, sinus rhythm, QTc 4 and 70 Patient was given Zithromax, Rocephin, Lasix, DuoNeb, Solu-Medrol-she was basically treated for CHF, COPD, pneumonia all at once Admission requested for acute respiratory failure with hypoxia Assessment and Plan: * Acute respiratory failure with hypoxia - Likely multifactorial - Patient has a history of COPD and CHF - Imaging shows  cardiomegaly.  Vascular congestion.  Possible retrocardiac pneumonia - Procalcitonin pending - Lasix given in the ED - Continue Lasix - Monitor intake and output - Continue albuterol as needed and Breo scheduled - Covered with Zithromax and Rocephin in the ED, but may be able to de-escalate when procalcitonin results - On 2 L nasal cannula at baseline - Required up to 6 L nasal cannula in the ED - Currently on 3-1/2 L nasal cannula  CHF (congestive heart failure) - Last echo was in May of last year and showed ejection fraction of 65-70% and grade 1 diastolic dysfunction - Update echo - Continue statin, Aldactone - Continue to monitor  COPD (chronic obstructive pulmonary disease) - Continue albuterol and Breo  Hypothyroidism - Continue Synthroid  Hyperlipidemia - Continue statin      Advance Care Planning:   Code Status: DNR  Consults: None at this time  Family Communication: No family at bedside  Severity of Illness: The appropriate patient status for this patient is INPATIENT. Inpatient status is judged to be reasonable and necessary in order to provide the required intensity of service to ensure the patient's safety. The patient's presenting symptoms, physical exam findings, and initial radiographic and laboratory data in the context of their chronic comorbidities is felt to place them at high risk for further clinical deterioration. Furthermore, it is not anticipated that the patient will be medically stable for discharge from the hospital within 2 midnights of admission.   * I certify that at the point of admission it is my clinical judgment that the patient will require inpatient hospital care spanning beyond 2 midnights from the point of admission due to high intensity of service, high risk for further deterioration and high frequency of surveillance required.*  Author: Lilyan Gilford, DO 07/05/2022 6:14 AM  For on call review [email or web address].

## 2022-07-05 NOTE — NC FL2 (Signed)
[place] MEDICAID FL2 LEVEL OF CARE FORM     IDENTIFICATION  Patient Name: Vernon Campbell Birthdate: 1940-12-10 Sex: male Admission Date (Current Location): 07/05/2022  [facility] and [place] Number:  [employer] and Address:  [facility],  [address] [place] [address]      Provider Number: [id number]  Attending Physician Name and Address:  Vassie Loll, MD  Relative Name and Phone Number:       Current Level of Care: Hospital Recommended Level of Care: Skilled Nursing Facility Prior Approval Number:    Date Approved/Denied:   PASRR Number:    Discharge Plan: SNF    Current Diagnoses: Patient Active Problem List   Diagnosis Date Noted   Acute respiratory failure with hypoxia 07/05/2022   CHF (congestive heart failure) 07/05/2022   SOB (shortness of breath) 12/11/2021   COPD (chronic obstructive pulmonary disease) 12/11/2021   Hypokalemia 08/20/2021   Acute on chronic diastolic heart failure 08/18/2021   Restless leg syndrome 08/18/2021   Chronic respiratory failure with hypoxia 08/18/2021   Acute exacerbation of CHF (congestive heart failure) 10/01/2020   Hyperlipidemia 10/01/2020   Hypothyroidism 10/01/2020   Macrocytic anemia 10/01/2020   AKI (acute kidney injury) 10/01/2020   Obesity, Class III, BMI 40-49.9 (morbid obesity) 10/01/2020   Acute on chronic respiratory failure with hypoxia 10/01/2020   Right thigh pain 10/01/2020   Right groin pain 10/01/2020   Chronic stasis dermatitis 10/01/2020    Orientation RESPIRATION BLADDER Height & Weight     Self, Time, Situation, Place  O2 (see dc summary) Incontinent Weight: 285 lb 7.9 oz (129.5 kg) Height:  5\' 10"  (177.8 cm)  BEHAVIORAL SYMPTOMS/MOOD NEUROLOGICAL BOWEL NUTRITION STATUS      Incontinent Diet (see dc summary)  AMBULATORY STATUS COMMUNICATION OF NEEDS Skin   Limited Assist Verbally Normal                       Personal Care Assistance Level of Assistance   Bathing, Feeding, Dressing Bathing Assistance: Limited assistance Feeding assistance: Independent Dressing Assistance: Limited assistance     Functional Limitations Info  Sight, Hearing, Speech Sight Info: Impaired Hearing Info: Adequate Speech Info: Adequate    SPECIAL CARE FACTORS FREQUENCY                       Contractures Contractures Info: Not present    Additional Factors Info  Code Status, Allergies Code Status Info: DNR Allergies Info: NKA           Current Medications (07/05/2022):  This is the current hospital active medication list Current Facility-Administered Medications  Medication Dose Route Frequency Provider Last Rate Last Admin   acetaminophen (TYLENOL) tablet 650 mg  650 mg Oral Q6H PRN Zierle-Ghosh, Asia B, DO       Or   acetaminophen (TYLENOL) suppository 650 mg  650 mg Rectal Q6H PRN Zierle-Ghosh, Asia B, DO       albuterol (PROVENTIL) (2.5 MG/3ML) 0.083% nebulizer solution 2.5 mg  2.5 mg Nebulization Q2H PRN Zierle-Ghosh, Asia B, DO       arformoterol (BROVANA) nebulizer solution 15 mcg  15 mcg Nebulization BID Vassie Loll, MD   15 mcg at 07/05/22 0933   atorvastatin (LIPITOR) tablet 20 mg  20 mg Oral Daily Zierle-Ghosh, Asia B, DO   20 mg at 07/05/22 0904   azithromycin (ZITHROMAX) 500 mg in sodium chloride 0.9 % 250 mL IVPB  500 mg Intravenous Q24H  Zierle-Ghosh, [place] B, DO   Stopped at 07/05/22 0750   budesonide (PULMICORT) nebulizer solution 0.5 mg  0.5 mg Nebulization BID Vassie Loll, MD   0.5 mg at 07/05/22 0934   [START ON 07/06/2022] cefTRIAXone (ROCEPHIN) 2 g in sodium chloride 0.9 % 100 mL IVPB  2 g Intravenous Q24H Zierle-Ghosh, Asia B, DO       dextromethorphan-guaiFENesin (MUCINEX DM) 30-600 MG per 12 hr tablet 1 tablet  1 tablet Oral BID Vassie Loll, MD   1 tablet at 07/05/22 1610   furosemide (LASIX) injection 40 mg  40 mg Intravenous Daily Vassie Loll, MD       heparin injection 5,000 Units  5,000 Units Subcutaneous Q8H  Zierle-Ghosh, Asia B, DO   5,000 Units at 07/05/22 0608   lactulose (CHRONULAC) 10 GM/15ML solution 10 g  10 g Oral Daily PRN Zierle-Ghosh, Asia B, DO       levothyroxine (SYNTHROID) tablet 175 mcg  175 mcg Oral QAC breakfast Zierle-Ghosh, Asia B, DO   175 mcg at 07/05/22 0608   ondansetron (ZOFRAN) tablet 4 mg  4 mg Oral Q6H PRN Zierle-Ghosh, Asia B, DO       Or   ondansetron (ZOFRAN) injection 4 mg  4 mg Intravenous Q6H PRN Zierle-Ghosh, Asia B, DO       oxyCODONE (Oxy IR/ROXICODONE) immediate release tablet 5 mg  5 mg Oral Q4H PRN Zierle-Ghosh, Asia B, DO       pantoprazole (PROTONIX) EC tablet 40 mg  40 mg Oral Daily Vassie Loll, MD   40 mg at 07/05/22 9604   pramipexole (MIRAPEX) tablet 1 mg  1 mg Oral QHS Zierle-Ghosh, Asia B, DO       [START ON 07/06/2022] predniSONE (DELTASONE) tablet 40 mg  40 mg Oral Q breakfast Vassie Loll, MD       sodium chloride HYPERTONIC 3 % nebulizer solution 4 mL  4 mL Nebulization Once Zierle-Ghosh, Asia B, DO       spironolactone (ALDACTONE) tablet 50 mg  50 mg Oral Daily Zierle-Ghosh, Asia B, DO   50 mg at 07/05/22 0904     Discharge Medications: Please see discharge summary for a list of discharge medications.  Relevant Imaging Results:  Relevant Lab Results:   Additional Information    Elliot Gault, LCSW

## 2022-07-05 NOTE — Assessment & Plan Note (Signed)
Continue statin. 

## 2022-07-05 NOTE — Progress Notes (Signed)
Patient received breathing treatment.  Patient is currently on 2L [place] with sats running between 92 to 94%.  Patient states he uses 2L at SNF.

## 2022-07-05 NOTE — Progress Notes (Signed)
Patient seen and examined; admitted after midnight secondary to acute on chronic respiratory failure with hypoxia and productive coughing spells; at baseline patient uses 2 L nasal cannula supplementation and in the requiring up to 6 L at time of admission to maintain saturations above 90%.  Otherwise hemodynamically stable.  No chest pain, no fever, no nausea or vomiting.  Workup demonstrating acute on chronic CHF exacerbation and COPD exacerbation; there is concern for bronchiectasis/early pneumonia.  Please refer to H&P written by Dr. Carren Rang further info/details on admission.  Plan: -Continue current antibiotic -Continue bronchodilator management using DuoNeb, Pulmicort and Brovana -Daily weights, strict intake and output, IV diuresis and breath clip measurements -Update 2D echo and follow clinical response. -continue flutter valve and mucolytic management.  Vassie Loll MD [phone]

## 2022-07-05 NOTE — Assessment & Plan Note (Signed)
-   Likely multifactorial - Patient has a history of COPD and CHF - Imaging shows cardiomegaly.  Vascular congestion.  Possible retrocardiac pneumonia - Procalcitonin pending - Lasix given in the ED - Continue Lasix - Monitor intake and output - Continue albuterol as needed and Breo scheduled - Covered with Zithromax and Rocephin in the ED, but may be able to de-escalate when procalcitonin results - On 2 L nasal cannula at baseline - Required up to 6 L nasal cannula in the ED - Currently on 3-1/2 L nasal cannula

## 2022-07-06 DIAGNOSIS — J9601 Acute respiratory failure with hypoxia: Secondary | ICD-10-CM | POA: Diagnosis not present

## 2022-07-06 DIAGNOSIS — J441 Chronic obstructive pulmonary disease with (acute) exacerbation: Secondary | ICD-10-CM | POA: Diagnosis not present

## 2022-07-06 DIAGNOSIS — E782 Mixed hyperlipidemia: Secondary | ICD-10-CM | POA: Diagnosis not present

## 2022-07-06 DIAGNOSIS — E039 Hypothyroidism, unspecified: Secondary | ICD-10-CM | POA: Diagnosis not present

## 2022-07-06 MED ORDER — CEFDINIR 300 MG PO CAPS
300.0000 mg | ORAL_CAPSULE | Freq: Two times a day (BID) | ORAL | Status: DC
  Administered 2022-07-06 – 2022-07-07 (×2): 300 mg via ORAL
  Filled 2022-07-06 (×2): qty 1

## 2022-07-06 NOTE — Progress Notes (Signed)
Progress Note   Patient: Vernon Campbell OZH:[id number] DOB: [birth year] DOA: 07/05/2022     1 DOS: the patient was seen and examined on 07/06/2022   Brief hospital course: As per H&P written by Dr.Zierle-Ghosh on 07/05/22 Vernon Campbell is a 82 y.o. male with medical history significant of hyperlipidemia, hypothyroidism, OSA, paroxysmal atrial fibrillation, pulmonary hypertension, COPD, CHF, and more presents the ER with a chief complaint of dyspnea.  Patient reports that he has had dyspnea that got acutely worse last night.  He reports that he could not catch his breath and his cough was hitting him in spells.  He has had this cough and dyspnea for about 60 days.  The cough is nonproductive.  He reports it feels like there is secretions that he needs to cough up but he cannot get them out.  He has had no hemoptysis.  Patient reports chronic peripheral edema for which he is get his legs wrapped.  He denies any chest pain, palpitations.  He denies any fever.  He does report that his dyspnea is worse on exertion.  He does not have a whole lot of mobility, but even just rolling over in bed he notices the dyspnea is worse.  He has not had any syncope or near syncopal events.  Patient reports a normal appetite.  He has not noticed a decrease in urine output, dysuria, hematuria.  He does wear 2 L nasal cannula at baseline.  He reports that when he first started having dyspnea that is what they prescribed for him.  When he came into the ER last night he was requiring 6 L nasal cannula.  Patient has no other complaints at this time.   Patient does not smoke, does not drink, does not use illicit drugs.  He is vaccinated for COVID.  Patient is DNR with ACP documents on file.  Assessment and Plan: * Acute respiratory failure with hypoxia - Acute on chronic respiratory failure with hypoxia likely multifactorial: Secondary to COPD and CHF exacerbation. -Chest x-ray at time of admission demonstrating cardiomegaly, vascular  congestion and bronchitic changes.   -Procalcitonin  <0.10 -Patient afebrile.   -Continue treatment with IV diuresis, IV steroids, nebulizer management, mucolytic therapy, flutter valve, and oral antibiotics. -Wean off oxygen supplementation back to baseline as tolerated -Patient normally uses around 3 L  -Continue daily weights, low-sodium diet and follow clinical response.   CHF (congestive heart failure) - Presenting with acute exacerbation as mentioned above -Repeat echo with ejection fraction 65-70% and no wall motion abnormalities.  There was mild left ventricle hypertrophy and grade 1 diastolic dysfunction appreciated. -Low-sodium diet, daily weights, strict intake and output and IV diuresis will be continued.  COPD (chronic obstructive pulmonary disease) -With acute exacerbation: -Continue steroids and nebulizer management -Flutter valve has been encouraged -Wean oxygen down to baseline as tolerated -Resume outpatient inhaler management at time of discharge and continue patient follow-up with pulmonology service.  Hypothyroidism - Continue Synthroid  Hyperlipidemia - Continue statin  Morbid obesity -Body mass index is 39.95 kg/m. -Low-calorie diet and portion control discussed with patient.  Subjective:  Reporting dyspnea with minimal exertion; intermittent coughing spells and mild orthopnea.  Positive expiratory wheezing, fine crackles and lower extremity swelling appreciated.  Currently requiring 4-5 L nasal cannula supplementation to maintain saturation.  Physical Exam: Vitals:   07/06/22 0500 07/06/22 0501 07/06/22 0753 07/06/22 1307  BP:  99/64  122/85  Pulse:  77  70  Resp:  18    Temp:  98.7  F (37.1 C)  98.5 F (36.9 C)  TempSrc:    Oral  SpO2:  96% 99% 92%  Weight: 126.3 kg     Height:       General exam: Alert, awake, oriented x 3; burning feeling short of breath with minimal activity and-intermittent nonproductive coughing spells with inability to  get rid off secretions. Respiratory system: Positive expiratory wheezing, fair air movement bilaterally; no using accessory muscle.  Decreased breath sounds at the bases and positive fine crackles. Cardiovascular system: Rate controlled, no rubs, no gallops, unable to properly assess JVD with body habitus. Gastrointestinal system: Abdomen is obese, nondistended, soft and nontender. No organomegaly or masses felt. Normal bowel sounds heard. Central nervous system: Alert and oriented. No focal neurological deficits. Extremities: No cyanosis or clubbing appreciated; 2+ edema appreciated bilaterally; Unna boots wrap in place. Skin: No petechiae. Psychiatry: Judgement and insight appear normal. Mood & affect appropriate.   Data Reviewed: Rights sleep measurement: 36 Basic metabolic panel: Sodium 136, potassium 4.0, chloride 94, bicarb 36, BUN 17, creatinine 1.07 and GFR more than 60. Procalcitonin: <0.10  Family Communication: Son updated over the phone (07/06/2022).  Disposition: Status is: Inpatient Remains inpatient appropriate because: Continue IV diuresis, IV steroids, nebulizer/bronchodilator management and continue weaning oxygen off to baseline.   Planned Discharge Destination: Skilled nursing facility (long-term resident).   Time spent: 35 minutes  Author: Vassie Loll, MD 07/06/2022 3:00 PM  For on call review [email or web address].

## 2022-07-06 NOTE — Progress Notes (Signed)
   07/06/22 1053  ReDS Vest / Clip  Station Marker D  Ruler Value 40  ReDS Value Range 36 - 40  ReDS Actual Value 36

## 2022-07-06 NOTE — Progress Notes (Signed)
Mobility Specialist Progress Note:    07/06/22 1015  Mobility  Activity Ambulated with assistance in hallway  Level of Assistance Standby assist, set-up cues, supervision of patient - no hands on  Assistive Device Front wheel walker  Distance Ambulated (ft) 30 ft  Activity Response Tolerated well  Mobility Referral Yes  $Mobility charge 1 Mobility   Pt agreeable to mobility session. Tolerated well, SpO2 90% on 3L during ambulation. Pt c/o leg weakness. Returned pt to room, sitting EOB, all needs met.   Feliciana Rossetti Mobility Specialist Please contact via Special educational needs teacher or  Rehab office at [phone]

## 2022-07-07 DIAGNOSIS — J9601 Acute respiratory failure with hypoxia: Secondary | ICD-10-CM | POA: Diagnosis not present

## 2022-07-07 LAB — BASIC METABOLIC PANEL
Anion gap: 8 (ref 5–15)
BUN: 24 mg/dL — ABNORMAL HIGH (ref 8–23)
CO2: 36 mmol/L — ABNORMAL HIGH (ref 22–32)
Calcium: 8.6 mg/dL — ABNORMAL LOW (ref 8.9–10.3)
Chloride: 92 mmol/L — ABNORMAL LOW (ref 98–111)
Creatinine, Ser: 0.89 mg/dL (ref 0.61–1.24)
GFR, Estimated: >60 mL/min (ref >60)
Glucose, Bld: 130 mg/dL — ABNORMAL HIGH (ref 70–99)
Potassium: 3.9 mmol/L (ref 3.5–5.1)
Sodium: 136 mmol/L (ref 135–145)

## 2022-07-07 MED ORDER — HYDROCODONE-ACETAMINOPHEN 5-325 MG PO TABS: 1.0000 | ORAL_TABLET | Freq: Three times a day (TID) | ORAL | 0 refills | Status: DC | PRN

## 2022-07-07 MED ORDER — PREDNISONE 20 MG PO TABS: 40.0000 mg | ORAL_TABLET | Freq: Every day | ORAL | 0 refills | Status: AC

## 2022-07-07 MED ORDER — PRAMIPEXOLE DIHYDROCHLORIDE 1 MG PO TABS: 1.0000 mg | ORAL_TABLET | Freq: Every day | ORAL | 0 refills | Status: DC

## 2022-07-07 MED ORDER — CEFDINIR 300 MG PO CAPS: 300.0000 mg | ORAL_CAPSULE | Freq: Two times a day (BID) | ORAL | 0 refills | Status: AC

## 2022-07-07 MED ORDER — PANTOPRAZOLE SODIUM 40 MG PO TBEC: 40.0000 mg | DELAYED_RELEASE_TABLET | Freq: Every day | ORAL | 0 refills | Status: DC

## 2022-07-07 MED ORDER — DM-GUAIFENESIN ER 30-600 MG PO TB12: 1.0000 | ORAL_TABLET | Freq: Two times a day (BID) | ORAL | 0 refills | Status: AC

## 2022-07-07 NOTE — TOC Transition Note (Signed)
Transition of Care [facility]) - CM/SW Discharge Note   Patient Details  Name: Vernon Campbell MRN: [id number] Date of Birth: [birth year]  Transition of Care [facility]) CM/SW Contact:  Leitha Bleak, RN Phone Number: 07/07/2022, 11:43 AM   Clinical Narrative:   Patient is medically ready to return to [facility]. Heather provided room number 410B. RN calling report. TOC will call EMS when RN is ready.  CM called her son to update him with DC plan.     Final next level of care: Long Term Acute Care (LTAC) Barriers to Discharge: Barriers Resolved   Patient Goals and CMS Choice     Discharge Placement                  Patient to be transferred to facility by: EMS Name of family member notified: Son Patient and family notified of of transfer: 07/07/22  Discharge Plan and Services Additional resources added to the After Visit Summary for   In-house Referral: Clinical Social Work               Social Determinants of Health (SDOH) Interventions SDOH Screenings   Food Insecurity: No Food Insecurity (07/05/2022)  Housing: Low Risk  (07/05/2022)  Transportation Needs: No Transportation Needs (07/05/2022)  Utilities: Not At Risk (07/05/2022)  Tobacco Use: Medium Risk (07/05/2022)     Readmission Risk Interventions    12/14/2021   12:46 PM  Readmission Risk Prevention Plan  Transportation Screening Complete  HRI or Home Care Consult Complete  Social Work Consult for Recovery Care Planning/Counseling Complete  Palliative Care Screening Not Applicable  Medication Review Oceanographer) Complete

## 2022-07-07 NOTE — Care Management Important Message (Signed)
Important Message  Patient Details  Name: Vernon Campbell MRN: [id number] Date of Birth: 1941-01-03   Medicare Important Message Given:  N/A - LOS <3 / Initial given by admissions     [name] 07/07/2022, 10:45 AM

## 2022-07-07 NOTE — Progress Notes (Signed)
Mobility Specialist Progress Note:    07/07/22 0950  Mobility  Activity Ambulated with assistance in hallway  Level of Assistance Standby assist, set-up cues, supervision of patient - no hands on  Assistive Device Front wheel walker  Distance Ambulated (ft) 30 ft  Activity Response Tolerated well  Mobility Referral Yes  $Mobility charge 1 Mobility   Pt agreeable to mobility session. Tolerated well, had slight audible SOB. SpO2 94% on 3L during ambulation. Return pt to room, sitting up in chair, all needs met.   Feliciana Rossetti Mobility Specialist Please contact via Special educational needs teacher or  Rehab office at [phone]

## 2022-07-07 NOTE — Discharge Summary (Signed)
Physician Discharge Summary  Vernon Campbell GNF:[id number] DOB: 11-04-1940 DOA: 07/05/2022  PCP: System, Provider Not In  Admit date: 07/05/2022  Discharge date: 07/07/2022  Admitted From:SNF  Disposition:  SNF  Recommendations for Outpatient Follow-up:  Follow up with PCP in 1-2 weeks Continue on medications as prescribed with prednisone and cefdinir for 5 more days Continue home torsemide as prior  Home Health: None  Equipment/Devices: Has chronic 3 L nasal cannula  Discharge Condition:Stable  CODE STATUS: DNR  Diet recommendation: Heart Healthy  Brief/Interim Summary:  Vernon Campbell is a 83 y.o. male with medical history significant of hyperlipidemia, hypothyroidism, OSA, paroxysmal atrial fibrillation, pulmonary hypertension, COPD, CHF, and more presents the ER with a chief complaint of dyspnea.  Patient was admitted with acute on chronic hypoxemic respiratory failure-multifactorial in the setting of COPD and CHF exacerbation.  He was placed on IV diuresis as well as IV steroids and nebulizers and has improved back to his usual baseline.  No other acute events or concerns noted throughout the duration of his stay and he is stable for discharge back to SNF on his usual 3 L nasal cannula oxygen.  Continue on medications as noted above.   Discharge Diagnoses:  Principal Problem:   Acute respiratory failure with hypoxia Active Problems:   Hyperlipidemia   Hypothyroidism   COPD (chronic obstructive pulmonary disease)   CHF (congestive heart failure)  Principal discharge diagnosis: Acute on chronic hypoxemic respiratory failure-multifactorial with acute on chronic diastolic CHF exacerbation as well as acute COPD exacerbation.  Discharge Instructions  Discharge Instructions     Diet - low sodium heart healthy   Complete by: As directed    Increase activity slowly   Complete by: As directed       Allergies as of 07/07/2022   No Known Allergies      Medication List      TAKE these medications    albuterol (2.5 MG/3ML) 0.083% nebulizer solution Commonly known as: PROVENTIL Take 3 mLs (2.5 mg total) by nebulization every 2 (two) hours as needed for shortness of breath.   atorvastatin 20 MG tablet Commonly known as: LIPITOR Take 20 mg by mouth daily.   cefdinir 300 MG capsule Commonly known as: OMNICEF Take 1 capsule (300 mg total) by mouth every 12 (twelve) hours for 5 days.   dextromethorphan-guaiFENesin 30-600 MG 12hr tablet Commonly known as: MUCINEX DM Take 1 tablet by mouth 2 (two) times daily for 10 days.   fluticasone furoate-vilanterol 100-25 MCG/ACT Aepb Commonly known as: BREO ELLIPTA Inhale 1 puff into the lungs daily.   HYDROcodone-acetaminophen 5-325 MG tablet Commonly known as: NORCO/VICODIN Take 1 tablet by mouth every 8 (eight) hours as needed for moderate pain.   ketoconazole 2 % shampoo Commonly known as: NIZORAL Apply 1 application  topically 2 (two) times a week. Apply to scalp (shampoo) topically every day shift every Tuesday and Friday for tinea versicolor (bath days)   lactulose 10 GM/15ML solution Commonly known as: CHRONULAC Take 15 mLs by mouth daily as needed for mild constipation.   levothyroxine 175 MCG tablet Commonly known as: SYNTHROID Take 175 mcg by mouth daily before breakfast.   OXYGEN Inhale 2 L into the lungs continuous.   pantoprazole 40 MG tablet Commonly known as: PROTONIX Take 1 tablet (40 mg total) by mouth daily for 10 days.   polyethylene glycol 17 g packet Commonly known as: MIRALAX / GLYCOLAX Take 17 g by mouth daily.   potassium chloride SA 20 MEQ tablet Commonly  known as: KLOR-CON M Take 20 mEq by mouth daily.   pramipexole 1 MG tablet Commonly known as: MIRAPEX Take 1 tablet (1 mg total) by mouth at bedtime.   predniSONE 20 MG tablet Commonly known as: DELTASONE Take 2 tablets (40 mg total) by mouth daily with breakfast for 5 days.   sennosides-docusate sodium 8.6-50 MG  tablet Commonly known as: SENOKOT-S Take 2 tablets by mouth at bedtime.   spironolactone 50 MG tablet Commonly known as: ALDACTONE Take 50 mg by mouth daily.   tamsulosin 0.4 MG Caps capsule Commonly known as: FLOMAX Take 1 capsule (0.4 mg total) by mouth daily after supper. What changed: when to take this   Torsemide 40 MG Tabs Take 40 mg by mouth See admin instructions. Take 60 mg every morning and 40 mg every evening        No Known Allergies  Consultations: None   Procedures/Studies: ECHOCARDIOGRAM COMPLETE  Result Date: 07/05/2022    ECHOCARDIOGRAM REPORT   Patient Name:   Vernon Campbell Date of Exam: 07/05/2022 Medical Rec #:  [id number]  Height:       70.0 in Accession #:    [id number] Weight:       285.5 lb Date of Birth:  [birth year]  BSA:          2.428 m Patient Age:    82 years   BP:           126/75 mmHg Patient Gender: M          HR:           72 bpm. [facility] Procedure: 2D Echo, Color Doppler, Cardiac Doppler and Intracardiac            Opacification Agent Indications:    Dyspnea R06.00  History:        Patient has prior history of Echocardiogram examinations, most                 recent 08/19/2021. CHF, Aortic Valve Disease; Risk                 Factors:Dyslipidemia and Former Smoker.  Sonographer:    Aron Baba Referring Phys: [id number] ASIA B ZIERLE-GHOSH  Sonographer Comments: Technically difficult study due to poor echo windows and no subcostal window. Image acquisition challenging due to patient body habitus, Image acquisition challenging due to COPD and Image acquisition challenging due to respiratory motion. IMPRESSIONS  1. Left ventricular ejection fraction, by estimation, is 65 to 70%. The left ventricle has normal function. The left ventricle has no regional wall motion abnormalities. There is mild left ventricular hypertrophy. Left ventricular diastolic parameters are consistent with Grade I diastolic dysfunction (impaired relaxation).  2. Right  ventricular systolic function is normal. The right ventricular size is normal. Tricuspid regurgitation signal is inadequate for assessing PA pressure.  3. Left atrial size was mild to moderately dilated.  4. The mitral valve is degenerative. Trivial mitral valve regurgitation.  5. The aortic valve is tricuspid. There is moderate calcification of the aortic valve. Aortic valve regurgitation is not visualized. 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 Aortic valve mean gradient measures 20.5 mmHg. Dimentionless index 0.31.  6. Aortic dilatation noted. There is mild dilatation of the aortic root, measuring 42 mm.  7. Unable to estimate CVP. Comparison(s): Prior images reviewed side by side. Aortic stenosis moderate to severe range with relatively stable mean AV gradient 21 mmHg. FINDINGS  Left Ventricle: Left ventricular ejection fraction, by estimation,  is 65 to 70%. The left ventricle has normal function. The left ventricle has no regional wall motion abnormalities. The left ventricular internal cavity size was normal in size. There is  mild left ventricular hypertrophy. Left ventricular diastolic parameters are consistent with Grade I diastolic dysfunction (impaired relaxation). Right Ventricle: The right ventricular size is normal. No increase in right ventricular wall thickness. Right ventricular systolic function is normal. Tricuspid regurgitation signal is inadequate for assessing PA pressure. Left Atrium: Left atrial size was mild to moderately dilated. Right Atrium: Right atrial size was normal in size. Pericardium: There is no evidence of pericardial effusion. Mitral Valve: The mitral valve is degenerative in appearance. There is mild calcification of the mitral valve leaflet(s). Mild mitral annular calcification. Trivial mitral valve regurgitation. Tricuspid Valve: The tricuspid valve is grossly normal. Tricuspid valve regurgitation is trivial. Aortic Valve: The aortic valve is tricuspid. There is moderate  calcification of the aortic valve. Aortic valve regurgitation is not visualized. Moderate to severe aortic stenosis is present. Aortic valve mean gradient measures 20.5 mmHg. Aortic valve peak gradient measures 31.2 mmHg. Aortic valve area, by VTI measures 1.09 cm. Pulmonic Valve: The pulmonic valve was grossly normal. Pulmonic valve regurgitation is trivial. Aorta: Aortic dilatation noted. There is mild dilatation of the aortic root, measuring 42 mm. Venous: Unable to estimate CVP. The inferior vena cava was not well visualized. IAS/Shunts: No atrial level shunt detected by color flow Doppler.  LEFT VENTRICLE PLAX 2D LVIDd:         4.90 cm   Diastology LVIDs:         3.20 cm   LV e' medial:    17.00 cm/s LV PW:         1.30 cm   LV E/e' medial:  5.0 LV IVS:        0.90 cm   LV e' lateral:   9.57 cm/s LVOT diam:     2.10 cm   LV E/e' lateral: 8.9 LV SV:         71 LV SV Index:   29 LVOT Area:     3.46 cm  RIGHT VENTRICLE RV S prime:     8.38 cm/s TAPSE (M-mode): 2.9 cm LEFT ATRIUM              Index        RIGHT ATRIUM           Index LA diam:        4.20 cm  1.73 cm/m   RA Area:     17.40 cm LA Vol (A2C):   48.7 ml  20.06 ml/m  RA Volume:   42.10 ml  17.34 ml/m LA Vol (A4C):   110.0 ml 45.30 ml/m LA Biplane Vol: 77.8 ml  32.04 ml/m  AORTIC VALVE AV Area (Vmax):    1.12 cm AV Area (Vmean):   1.12 cm AV Area (VTI):     1.09 cm AV Vmax:           279.50 cm/s AV Vmean:          214.500 cm/s AV VTI:            0.650 m AV Peak Grad:      31.2 mmHg AV Mean Grad:      20.5 mmHg LVOT Vmax:         90.20 cm/s LVOT Vmean:        69.400 cm/s LVOT VTI:  0.204 m LVOT/AV VTI ratio: 0.31  AORTA Ao Root diam: 4.20 cm Ao Asc diam:  3.50 cm MITRAL VALVE                TRICUSPID VALVE MV Area (PHT): 2.54 cm     TR Peak grad:   9.1 mmHg MV Decel Time: 299 msec     TR Vmax:        151.00 cm/s MR Peak grad: 1.9 mmHg MR Vmax:      68.40 cm/s    SHUNTS MV E velocity: 84.80 cm/s   Systemic VTI:  0.20 m MV A velocity:  104.00 cm/s  Systemic Diam: 2.10 cm MV E/A ratio:  0.82 Nona Dell MD Electronically signed by Nona Dell MD Signature Date/Time: 07/05/2022/1:54:54 PM    Final    DG Chest Portable 1 View  Result Date: 07/05/2022 CLINICAL DATA:  Extremely short of breath EXAM: PORTABLE CHEST 1 VIEW COMPARISON:  Radiograph 06/17/2022 FINDINGS: Stable marked enlargement of the cardiomediastinal silhouette. Prominent pulmonary vascularity may be due to low lung volumes or congestion. Retrocardiac atelectasis or pneumonia. Probable small left pleural effusion. No pneumothorax. IMPRESSION: 1. Stable marked enlargement of the cardiomediastinal silhouette. Prominent pulmonary vascularity may be due to low lung volumes or congestion. 2. Retrocardiac atelectasis or pneumonia. Electronically Signed   By: Minerva Fester M.D.   On: 07/05/2022 01:46   DG Chest Port 1 View  Result Date: 06/17/2022 CLINICAL DATA:  CHF, fluid overload, weight gain, increased shortness of breath EXAM: PORTABLE CHEST 1 VIEW COMPARISON:  Portable exam 1619 hours compared to 12/15/2021 FINDINGS: Enlargement of cardiac silhouette with pulmonary vascular congestion. Chronic elevation of LEFT diaphragm. Minimal chronic accentuation of interstitial markings. No acute pulmonary infiltrate or edema. Trace LEFT pleural effusion. No pneumothorax or acute osseous findings. IMPRESSION: Enlargement of cardiac silhouette with pulmonary vascular congestion. Trace LEFT pleural effusion without acute pulmonary edema. Electronically Signed   By: Ulyses Southward M.D.   On: 06/17/2022 16:29     Discharge Exam: Vitals:   07/07/22 0559 07/07/22 0721  BP: 132/85   Pulse: 80   Resp: 20   Temp: 98.5 F (36.9 C)   SpO2: 95% 96%   Vitals:   07/06/22 2027 07/06/22 2109 07/07/22 0559 07/07/22 0721  BP:  118/74 132/85   Pulse:  85 80   Resp:  (!) 22 20   Temp:  98.3 F (36.8 C) 98.5 F (36.9 C)   TempSrc:      SpO2: 97% 95% 95% 96%  Weight:   127.5 kg    Height:        General: Pt is alert, awake, not in acute distress Cardiovascular: RRR, S1/S2 +, no rubs, no gallops Respiratory: CTA bilaterally, no wheezing, no rhonchi, 3 L nasal cannula Abdominal: Soft, NT, ND, bowel sounds + Extremities: no edema, no cyanosis    The results of significant diagnostics from this hospitalization (including imaging, microbiology, ancillary and laboratory) are listed below for reference.     Microbiology: Recent Results (from the past 240 hour(s))  Resp panel by RT-PCR (RSV, Flu A&B, Covid) Anterior Nasal Swab     Status: None   Collection Time: 07/05/22  1:55 AM   Specimen: Anterior Nasal Swab  Result Value Ref Range Status   SARS Coronavirus 2 by RT PCR NEGATIVE NEGATIVE Final    Comment: (NOTE) SARS-CoV-2 target nucleic acids are NOT DETECTED.  The SARS-CoV-2 RNA is generally detectable in upper respiratory specimens during the acute phase of  infection. The lowest concentration of SARS-CoV-2 viral copies this assay can detect is 138 copies/mL. A negative result does not preclude SARS-Cov-2 infection and should not be used as the sole basis for treatment or other patient management decisions. A negative result may occur with  improper specimen collection/handling, submission of specimen other than nasopharyngeal swab, presence of viral mutation(s) within the areas targeted by this assay, and inadequate number of viral copies(<138 copies/mL). A negative result must be combined with clinical observations, patient history, and epidemiological information. The expected result is Negative.  Fact Sheet for Patients:  BloggerCourse.com  Fact Sheet for Healthcare Providers:  SeriousBroker.it  This test is no t yet approved or cleared by the [place] FDA and  has been authorized for detection and/or diagnosis of SARS-CoV-2 by FDA under an Emergency Use Authorization (EUA). This EUA will  remain  in effect (meaning this test can be used) for the duration of the COVID-19 declaration under Section 564(b)(1) of the Act, 21 U.S.C.section 360bbb-3(b)(1), unless the authorization is terminated  or revoked sooner.       Influenza A by PCR NEGATIVE NEGATIVE Final   Influenza B by PCR NEGATIVE NEGATIVE Final    Comment: (NOTE) The Xpert Xpress SARS-CoV-2/FLU/RSV plus assay is intended as an aid in the diagnosis of influenza from Nasopharyngeal swab specimens and should not be used as a sole basis for treatment. Nasal washings and aspirates are unacceptable for Xpert Xpress SARS-CoV-2/FLU/RSV testing.  Fact Sheet for Patients: BloggerCourse.com  Fact Sheet for Healthcare Providers: SeriousBroker.it  This test is not yet approved or cleared by the [place] FDA and has been authorized for detection and/or diagnosis of SARS-CoV-2 by FDA under an Emergency Use Authorization (EUA). This EUA will remain in effect (meaning this test can be used) for the duration of the COVID-19 declaration under Section 564(b)(1) of the Act, 21 U.S.C. section 360bbb-3(b)(1), unless the authorization is terminated or revoked.     Resp Syncytial Virus by PCR NEGATIVE NEGATIVE Final    Comment: (NOTE) Fact Sheet for Patients: BloggerCourse.com  Fact Sheet for Healthcare Providers: SeriousBroker.it  This test is not yet approved or cleared by the [place] FDA and has been authorized for detection and/or diagnosis of SARS-CoV-2 by FDA under an Emergency Use Authorization (EUA). This EUA will remain in effect (meaning this test can be used) for the duration of the COVID-19 declaration under Section 564(b)(1) of the Act, 21 U.S.C. section 360bbb-3(b)(1), unless the authorization is terminated or revoked.  Performed at [facility], [address], [place], [place] [address]       Labs: BNP (last 3 results) Recent Labs    12/15/21 0408 06/17/22 1531 07/05/22 0127  BNP 65.0 34.0 32.0   Basic Metabolic Panel: Recent Labs  Lab 07/05/22 0127 07/07/22 0439  NA 136 136  K 4.0 3.9  CL 94* 92*  CO2 36* 36*  GLUCOSE 107* 130*  BUN 17 24*  CREATININE 1.07 0.89  CALCIUM 8.2* 8.6*   Liver Function Tests: No results for input(s): "AST", "ALT", "ALKPHOS", "BILITOT", "PROT", "ALBUMIN" in the last 168 hours. No results for input(s): "LIPASE", "AMYLASE" in the last 168 hours. No results for input(s): "AMMONIA" in the last 168 hours. CBC: Recent Labs  Lab 07/05/22 0127  WBC 11.1*  HGB 15.9  HCT 52.6*  MCV 102.1*  PLT 172   Cardiac Enzymes: No results for input(s): "CKTOTAL", "CKMB", "CKMBINDEX", "TROPONINI" in the last 168 hours. BNP: Invalid input(s): "POCBNP" CBG: No results for  input(s): "GLUCAP" in the last 168 hours. D-Dimer No results for input(s): "DDIMER" in the last 72 hours. Hgb A1c No results for input(s): "HGBA1C" in the last 72 hours. Lipid Profile No results for input(s): "CHOL", "HDL", "LDLCALC", "TRIG", "CHOLHDL", "LDLDIRECT" in the last 72 hours. Thyroid function studies No results for input(s): "TSH", "T4TOTAL", "T3FREE", "THYROIDAB" in the last 72 hours.  Invalid input(s): "FREET3" Anemia work up No results for input(s): "VITAMINB12", "FOLATE", "FERRITIN", "TIBC", "IRON", "RETICCTPCT" in the last 72 hours. Urinalysis    Component Value Date/Time   APPEARANCEUR Cloudy (A) 01/22/2022 0927   GLUCOSEU Negative 01/22/2022 0927   BILIRUBINUR Negative 01/22/2022 0927   PROTEINUR Negative 01/22/2022 0927   NITRITE Negative 01/22/2022 0927   LEUKOCYTESUR Negative 01/22/2022 0927   Sepsis Labs Recent Labs  Lab 07/05/22 0127  WBC 11.1*   Microbiology Recent Results (from the past 240 hour(s))  Resp panel by RT-PCR (RSV, Flu A&B, Covid) Anterior Nasal Swab     Status: None   Collection Time: 07/05/22  1:55 AM   Specimen:  Anterior Nasal Swab  Result Value Ref Range Status   SARS Coronavirus 2 by RT PCR NEGATIVE NEGATIVE Final    Comment: (NOTE) SARS-CoV-2 target nucleic acids are NOT DETECTED.  The SARS-CoV-2 RNA is generally detectable in upper respiratory specimens during the acute phase of infection. The lowest concentration of SARS-CoV-2 viral copies this assay can detect is 138 copies/mL. A negative result does not preclude SARS-Cov-2 infection and should not be used as the sole basis for treatment or other patient management decisions. A negative result may occur with  improper specimen collection/handling, submission of specimen other than nasopharyngeal swab, presence of viral mutation(s) within the areas targeted by this assay, and inadequate number of viral copies(<138 copies/mL). A negative result must be combined with clinical observations, patient history, and epidemiological information. The expected result is Negative.  Fact Sheet for Patients:  BloggerCourse.com  Fact Sheet for Healthcare Providers:  SeriousBroker.it  This test is no t yet approved or cleared by the [place] FDA and  has been authorized for detection and/or diagnosis of SARS-CoV-2 by FDA under an Emergency Use Authorization (EUA). This EUA will remain  in effect (meaning this test can be used) for the duration of the COVID-19 declaration under Section 564(b)(1) of the Act, 21 U.S.C.section 360bbb-3(b)(1), unless the authorization is terminated  or revoked sooner.       Influenza A by PCR NEGATIVE NEGATIVE Final   Influenza B by PCR NEGATIVE NEGATIVE Final    Comment: (NOTE) The Xpert Xpress SARS-CoV-2/FLU/RSV plus assay is intended as an aid in the diagnosis of influenza from Nasopharyngeal swab specimens and should not be used as a sole basis for treatment. Nasal washings and aspirates are unacceptable for Xpert Xpress SARS-CoV-2/FLU/RSV testing.  Fact  Sheet for Patients: BloggerCourse.com  Fact Sheet for Healthcare Providers: SeriousBroker.it  This test is not yet approved or cleared by the [place] FDA and has been authorized for detection and/or diagnosis of SARS-CoV-2 by FDA under an Emergency Use Authorization (EUA). This EUA will remain in effect (meaning this test can be used) for the duration of the COVID-19 declaration under Section 564(b)(1) of the Act, 21 U.S.C. section 360bbb-3(b)(1), unless the authorization is terminated or revoked.     Resp Syncytial Virus by PCR NEGATIVE NEGATIVE Final    Comment: (NOTE) Fact Sheet for Patients: BloggerCourse.com  Fact Sheet for Healthcare Providers: SeriousBroker.it  This test is not yet approved or cleared by  the [employer] and has been authorized for detection and/or diagnosis of SARS-CoV-2 by FDA under an Emergency Use Authorization (EUA). This EUA will remain in effect (meaning this test can be used) for the duration of the COVID-19 declaration under Section 564(b)(1) of the Act, 21 U.S.C. section 360bbb-3(b)(1), unless the authorization is terminated or revoked.  Performed at [facility], [address], [place], [place] [address]      Time coordinating discharge: 35 minutes  SIGNED:   Erick Blinks, DO [facility] 07/07/2022, 10:17 AM  If 7PM-7AM, please contact night-coverage [email or web address]

## 2022-07-07 NOTE — Progress Notes (Signed)
Spoke with RN Danne Baxter who took report at [facility].

## 2022-07-24 IMAGING — DX DG CHEST 1V PORT
1 series · 1 of 1 positions shown · non-contrast
Comparison: 12/08/2020 chest radiograph and prior studies

CLINICAL DATA: Shortness of breath and LOWER extremity swelling.
History of heart failure.

EXAM:
PORTABLE CHEST 1 VIEW

[chest ap]
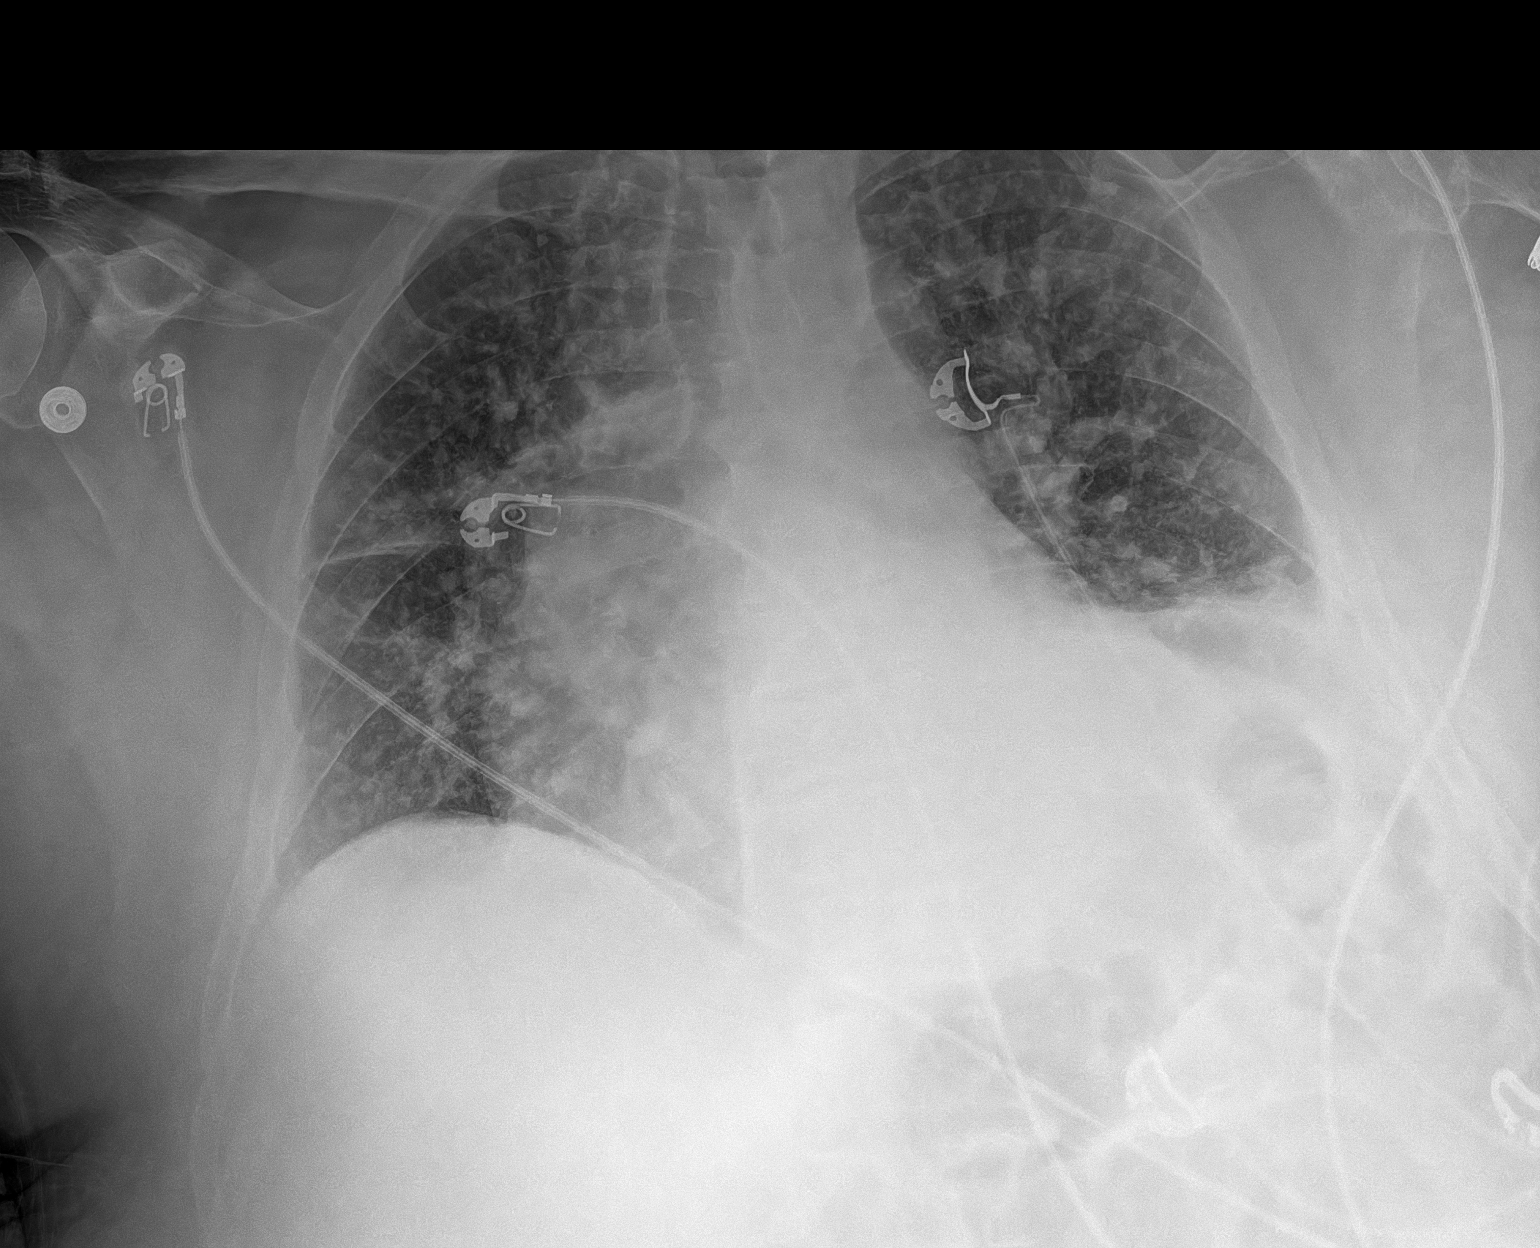

[1 of 1 positions shown; findings below may reference images not displayed]

FINDINGS: Cardiomegaly with pulmonary vascular congestion noted.

Increasing interstitial opacities identified, likely representing
edema.

LEFT basilar atelectasis/scarring again noted.

No pneumothorax or large pleural effusion identified.
IMPRESSION: Cardiomegaly with pulmonary vascular congestion. Increasing
interstitial opacities likely representing edema.

## 2022-07-30 ENCOUNTER — Ambulatory Visit: Payer: Medicare Other | Admitting: Urology

## 2022-09-02 NOTE — Progress Notes (Signed)
History of Present Illness: [name] is a 82 y.o. male who presents today for follow up visit at [facility] [place]. - GU history: 1. BPH. Taking Flomax.  2. LUTS: frequency, nocturia, urgency. - Exacerbated by diuretic use for CHF management with bilateral lower extremity lymphedema.  Per last visit note by [name], PA on 01/22/2022: - Patient resides at [facility] skilled facility.  - Doing "much better with frequency and has started new compression treatments to assist with his significant edema which has improved significantly." - He is overall very happy with his voiding habits and continues therapy, but remains in wheelchair for mobilization."  - The plan was: "Continue tamsulosin and therapies for edema.  He will follow-up in 6 months for UA and PVR."  Since last visit: 07/07/2022: Creatinine 0.89; GFR >60  Today: He reports doing well with no acute concerns. He reports ongoing urinary frequency and urgency which he attributes to diuretic use. He denies dysuria, gross hematuria, straining to void, or sensations of incomplete emptying. Denies CPAP use at this time for his OSA (re: nocturia); states it is on order.   Fall Screening: Do you usually have a device to assist in your mobility? Yes - wheelchair   Medications: Current Outpatient Medications  Medication Sig Dispense Refill   albuterol (PROVENTIL) (2.5 MG/3ML) 0.083% nebulizer solution Take 3 mLs (2.5 mg total) by nebulization every 2 (two) hours as needed for shortness of breath. 75 mL 12   atorvastatin (LIPITOR) 20 MG tablet Take 20 mg by mouth daily.     fluticasone furoate-vilanterol (BREO ELLIPTA) 100-25 MCG/ACT AEPB Inhale 1 puff into the lungs daily. 28 each 3   HYDROcodone-acetaminophen (NORCO/VICODIN) 5-325 MG tablet Take 1 tablet by mouth every 8 (eight) hours as needed for moderate pain. 5 tablet 0   ketoconazole (NIZORAL) 2 % shampoo Apply 1 application  topically 2 (two) times a week.  Apply to scalp (shampoo) topically every day shift every Tuesday and Friday for tinea versicolor (bath days)     lactulose (CHRONULAC) 10 GM/15ML solution Take 15 mLs by mouth daily as needed for mild constipation.     levothyroxine (SYNTHROID) 175 MCG tablet Take 175 mcg by mouth daily before breakfast.     OXYGEN Inhale 2 L into the lungs continuous.     polyethylene glycol (MIRALAX / GLYCOLAX) 17 g packet Take 17 g by mouth daily.     potassium chloride SA (KLOR-CON) 20 MEQ tablet Take 20 mEq by mouth daily.     pramipexole (MIRAPEX) 1 MG tablet Take 1 tablet (1 mg total) by mouth at bedtime. 10 tablet 0   sennosides-docusate sodium (SENOKOT-S) 8.6-50 MG tablet Take 2 tablets by mouth at bedtime.     spironolactone (ALDACTONE) 50 MG tablet Take 50 mg by mouth daily.     Torsemide 40 MG TABS Take 40 mg by mouth See admin instructions. Take 60 mg every morning and 40 mg every evening 120 tablet 2   pantoprazole (PROTONIX) 40 MG tablet Take 1 tablet (40 mg total) by mouth daily for 10 days. 10 tablet 0   tamsulosin (FLOMAX) 0.4 MG CAPS capsule Take 1 capsule (0.4 mg total) by mouth daily after supper. 30 capsule 11   No current facility-administered medications for this visit.    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)  Age-related physical debility    Aortic stenosis    Carpal tunnel syndrome, right    Chronic cor pulmonale (HCC)    Chronic diastolic heart failure (HCC)    Chronic respiratory failure with hypoxia (HCC)    Combined forms of age-related cataract, bilateral    Constipation, chronic    Dermatochalasis of eyelid    Extreme obesity with alveolar hypoventilation (HCC)    History of tobacco use    Hyperlipidemia    Hypermetropia, bilateral    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 No family history on file. Social History   Socioeconomic History   Marital status: Single    Spouse name: Not on file   Number of children: Not on file   Years of education: Not on file   Highest education level: Not on file  Occupational History   Not on file  Tobacco Use   Smoking status: Former    Types: Cigarettes   Smokeless tobacco: Never  Vaping Use   Vaping Use: Never used  Substance and Sexual Activity   Alcohol use: Not Currently   Drug use: Not Currently   Sexual activity: Not Currently  Other Topics Concern   Not on file  Social History Narrative   Not on file   Social Determinants of Health   Financial Resource Strain: Not on file  Food Insecurity: No Food Insecurity (07/05/2022)   Hunger Vital Sign    Worried About Running Out of Food in the Last Year: Never true    Ran Out of Food in the Last Year: Never true  Transportation Needs: No Transportation Needs (07/05/2022)   PRAPARE - Administrator, Civil Service (Medical): No    Lack of Transportation (Non-Medical): No  Physical Activity: Not on file  Stress: Not on file  Social Connections: Not on file  Intimate Partner Violence: Not At Risk (07/05/2022)   Humiliation, Afraid, Rape, and Kick questionnaire    Fear of Current or Ex-Partner: No    Emotionally Abused: No    Physically Abused: No    Sexually Abused: No    Review of Systems Constitutional: Patient denies any unintentional weight  loss or change in strength lntegumentary: Patient denies any rashes or pruritus Eyes: Patient denies dry eyes ENT: Patient denies dry mouth Cardiovascular: Patient denies chest pain or syncope Respiratory: Patient denies shortness of breath Gastrointestinal: Patient denies nausea, vomiting, constipation, or diarrhea Musculoskeletal: Patient denies muscle cramps or weakness Neurologic: Patient denies convulsions or seizures Psychiatric: Patient denies memory problems Allergic/Immunologic: Patient denies recent allergic reaction(s) Hematologic/Lymphatic: Patient denies bleeding tendencies Endocrine: Patient denies heat/cold intolerance  GU: As per HPI.  OBJECTIVE Vitals:   09/06/22 1221  BP: 111/67  Pulse: 73  Temp: 98.1 F (36.7 C)   There is no height or weight on file to calculate BMI.  Physical Examination  Constitutional: No obvious distress; patient is non-toxic appearing  Cardiovascular: BLE wrapped as per SNF for lower extremity edema.  Respiratory: The patient does not have audible wheezing/stridor; respirations do not appear labored  Gastrointestinal: Obese abdomen  Musculoskeletal: Normal ROM of UEs  Skin: No obvious rashes/open sores  Neurologic: CN 2-12 grossly intact Psychiatric: Answered questions appropriately with normal affect  Hematologic/Lymphatic/Immunologic: No obvious bruises or sites of spontaneous bleeding  UA: negative  ASSESSMENT Benign prostatic hyperplasia with urinary frequency - Plan: tamsulosin (FLOMAX) 0.4 MG CAPS capsule  Nocturia - Plan: tamsulosin (FLOMAX) 0.4 MG CAPS capsule  Urinary urgency - Plan: tamsulosin (FLOMAX) 0.4 MG CAPS capsule  He is doing well. Flomax refills sent; he agreed to continue that. Will plan for follow up in 1 year or sooner if needed. Pt verbalized understanding and agreement. All questions were answered.  PLAN Advised the following: 1. Continue Flomax daily. 2. Return in about 1 year (around 09/06/2023) for  UA, PVR, & f/u with Evette Georges NP.  No orders of the defined types were placed in this encounter.   It has been explained that the patient is to follow regularly with their PCP in addition to all other providers involved in their care and to follow instructions provided by these respective offices. Patient advised to contact urology clinic if any urologic-pertaining questions, concerns, new symptoms or problems arise in the interim period.  There are no Patient Instructions on file for this visit.  Electronically signed by:  Donnita Falls, FNP   09/06/22    12:59 PM

## 2022-09-06 ENCOUNTER — Ambulatory Visit (INDEPENDENT_AMBULATORY_CARE_PROVIDER_SITE_OTHER): Payer: Medicare Other | Admitting: Urology

## 2022-09-06 ENCOUNTER — Encounter: Payer: Self-pay | Admitting: Urology

## 2022-09-06 VITALS — BP 111/67 | HR 73 | Temp 98.1°F

## 2022-09-06 DIAGNOSIS — R351 Nocturia: Secondary | ICD-10-CM

## 2022-09-06 DIAGNOSIS — R350 Frequency of micturition: Secondary | ICD-10-CM | POA: Diagnosis not present

## 2022-09-06 DIAGNOSIS — R3915 Urgency of urination: Secondary | ICD-10-CM | POA: Diagnosis not present

## 2022-09-06 DIAGNOSIS — N401 Enlarged prostate with lower urinary tract symptoms: Secondary | ICD-10-CM | POA: Diagnosis not present

## 2022-09-06 MED ORDER — TAMSULOSIN HCL 0.4 MG PO CAPS
0.4000 mg | ORAL_CAPSULE | Freq: Every day | ORAL | 11 refills | Status: DC
Start: 2022-09-06 — End: 2023-01-24

## 2022-09-21 NOTE — Progress Notes (Unsigned)
Name: Vernon Campbell DOB: [birth year] MRN: [id number]  History of Present Illness: [name] is a 82 y.o. male who presents today for follow up visit at [facility] [place]. He resides at [facility]. He is accompanied by Vernon Perla, CNA.  - GU history: 1. BPH with LUTS (frequency, nocturia, urgency). - Taking Flomax.  - Exacerbated by diuretic use for CHF management with bilateral lower extremity lymphedema. Uses compression therapy.    At last visit on 09/06/2022: - Doing well; no acute concerns. Reported ongoing urinary frequency and urgency which he attributes to diuretic use. Denies CPAP use at this time for his OSA (re: nocturia); states it is on order. - The plan was to continue Flomax and follow up in 1 year.  Today: He reports swelling of his penis for the past 2 weeks. He denies penile pain, warmth, tenderness, rash, or discharge. He is unsure if there is any redness. He denies fevers. Reports spraying urinary stream. Denies increased urinary urgency, frequency, dysuria, gross hematuria, straining to void, or sensations of incomplete emptying.   Fall Screening: Do you usually have a device to assist in your mobility? Yes - wheelchair  Medications: Current Outpatient Medications  Medication Sig Dispense Refill   albuterol (PROVENTIL) (2.5 MG/3ML) 0.083% nebulizer solution Take 3 mLs (2.5 mg total) by nebulization every 2 (two) hours as needed for shortness of breath. 75 mL 12   atorvastatin (LIPITOR) 20 MG tablet Take 20 mg by mouth daily.     fluticasone furoate-vilanterol (BREO ELLIPTA) 100-25 MCG/ACT AEPB Inhale 1 puff into the lungs daily. 28 each 3   HYDROcodone-acetaminophen (NORCO/VICODIN) 5-325 MG tablet Take 1 tablet by mouth every 8 (eight) hours as needed for moderate pain. 5 tablet 0   ketoconazole (NIZORAL) 2 % shampoo Apply 1 application  topically 2 (two) times a week. Apply to scalp (shampoo) topically every day shift every Tuesday and Friday for  tinea versicolor (bath days)     lactulose (CHRONULAC) 10 GM/15ML solution Take 15 mLs by mouth daily as needed for mild constipation.     levothyroxine (SYNTHROID) 175 MCG tablet Take 175 mcg by mouth daily before breakfast.     OXYGEN Inhale 2 L into the lungs continuous.     polyethylene glycol (MIRALAX / GLYCOLAX) 17 g packet Take 17 g by mouth daily.     potassium chloride SA (KLOR-CON) 20 MEQ tablet Take 20 mEq by mouth daily.     pramipexole (MIRAPEX) 1 MG tablet Take 1 tablet (1 mg total) by mouth at bedtime. 10 tablet 0   sennosides-docusate sodium (SENOKOT-S) 8.6-50 MG tablet Take 2 tablets by mouth at bedtime.     spironolactone (ALDACTONE) 50 MG tablet Take 50 mg by mouth daily.     sulfamethoxazole-trimethoprim (BACTRIM DS) 800-160 MG tablet Take 1 tablet by mouth every 12 (twelve) hours. 14 tablet 0   tamsulosin (FLOMAX) 0.4 MG CAPS capsule Take 1 capsule (0.4 mg total) by mouth daily after supper. 30 capsule 11   Torsemide 40 MG TABS Take 40 mg by mouth See admin instructions. Take 60 mg every morning and 40 mg every evening 120 tablet 2   pantoprazole (PROTONIX) 40 MG tablet Take 1 tablet (40 mg total) by mouth daily for 10 days. 10 tablet 0   No current facility-administered medications for this visit.    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)    Age-related physical debility    Aortic stenosis    Carpal tunnel syndrome, right    Chronic cor pulmonale (HCC)    Chronic diastolic heart failure (HCC)    Chronic respiratory failure with hypoxia (HCC)    Combined forms of age-related cataract, bilateral    Constipation, chronic    Dermatochalasis of eyelid    Extreme obesity with alveolar hypoventilation (HCC)    History of tobacco use    Hyperlipidemia    Hypermetropia, bilateral    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 No pertinent surgical history. History reviewed. No pertinent family history. Social History   Socioeconomic History   Marital status: Single    Spouse name: Not on file   Number of children: Not on file   Years of education: Not on file   Highest education level: Not on file  Occupational History   Not on file  Tobacco Use   Smoking status: Former    Types: Cigarettes   Smokeless tobacco: Never  Vaping Use   Vaping Use: Never used  Substance and Sexual Activity   Alcohol use: Not Currently   Drug use: Not Currently   Sexual activity: Not Currently  Other Topics Concern   Not on file  Social History Narrative   Not on file   Social Determinants of Health   Financial Resource Strain: Not on file  Food Insecurity: No Food Insecurity (07/05/2022)   Hunger Vital Sign    Worried About Running Out of Food in the Last Year: Never true    Ran Out of Food in the Last Year: Never true  Transportation Needs: No Transportation Needs (07/05/2022)   PRAPARE - Administrator, Civil Service (Medical): No    Lack of Transportation (Non-Medical): No  Physical Activity: Not on file  Stress: Not on file  Social Connections: Not on file  Intimate Partner Violence: Not At Risk (07/05/2022)   Humiliation, Afraid, Rape, and Kick questionnaire    Fear of Current or Ex-Partner: No    Emotionally Abused: No    Physically Abused: No    Sexually Abused: No     Review of Systems Constitutional: Patient denies any unintentional weight loss or change in strength lntegumentary: Patient denies any rashes or pruritus Cardiovascular: Patient denies chest pain or syncope Respiratory: Patient denies shortness of breath at rest Gastrointestinal: Patient denies nausea, vomiting, constipation, or diarrhea Musculoskeletal: Patient denies muscle cramps or weakness Neurologic: Patient denies convulsions or seizures Psychiatric: Patient denies memory problems Allergic/Immunologic: Patient denies recent allergic reaction(s) Hematologic/Lymphatic: Patient denies bleeding tendencies Endocrine: Patient denies heat/cold intolerance  GU: As per HPI.  OBJECTIVE Vitals:   09/22/22 1138  BP: 109/75  Pulse: 74  Temp: 97.9 F (36.6 C)   There is no height or weight on file to calculate BMI.  Physical Examination  Constitutional: No obvious distress; patient is non-toxic appearing  Cardiovascular: BLE edema; wearing BLE compression wraps Respiratory: Some dyspnea on exertion (standing) Gastrointestinal: Obese abdomen  Musculoskeletal: Normal ROM of UEs; difficulty standing Skin: No obvious rashes/open sores; BLE edema Neurologic: CN 2-12 grossly intact Psychiatric: Answered questions appropriately with normal affect  Hematologic/Lymphatic/Immunologic: No obvious bruises or sites of spontaneous bleeding  Genitourinary: Penis is edematous circumferentially; unable to view urethral orifice. Scrotum is also edematous bilaterally (R > L). No fluctuance, warmth, rash, lesions, or tenderness to palpation. Mild erythema. Pelvic exam was chaperoned by S. Chestine Spore, CMA along with Judeth Cornfield, CNA from SNF.  UA: no evidence of UTI or microscopic hematuria PVR: 42 ml  ASSESSMENT Penis, cellulitis - Plan: sulfamethoxazole-trimethoprim (BACTRIM DS) 800-160 MG tablet  Scrotal swelling - Plan: Urinalysis, Routine w reflex microscopic, BLADDER SCAN AMB NON-IMAGING,  sulfamethoxazole-trimethoprim (BACTRIM DS) 800-160 MG tablet  Penile swelling - Plan: sulfamethoxazole-trimethoprim (BACTRIM DS) 800-160 MG tablet  Will treat with Bactrim for penoscrotal cellulitis with edema. He was advised to elevate scrotum and penis when sedentary. Will plan for follow up in 1 week for recheck. Pt verbalized understanding and agreement. All questions were answered.  PLAN Advised the following: 1. Bactrim DS 2x/day for 7 days. 2. Return in about 1 week (around 09/29/2022) for UA, PVR, & f/u with Evette Georges NP.  Orders Placed This Encounter  Procedures   Urinalysis, Routine w reflex microscopic   BLADDER SCAN AMB NON-IMAGING    It has been explained that the patient is to follow regularly with their PCP in addition to all other providers involved in their care and to follow instructions provided by these respective offices. Patient advised to contact urology clinic if any urologic-pertaining questions, concerns, new symptoms or problems arise in the interim period.  There are no Patient Instructions on file for this visit.  Electronically signed by:  Donnita Falls, FNP   09/22/22    12:09 PM

## 2022-09-22 ENCOUNTER — Encounter: Payer: Self-pay | Admitting: Urology

## 2022-09-22 ENCOUNTER — Ambulatory Visit (INDEPENDENT_AMBULATORY_CARE_PROVIDER_SITE_OTHER): Payer: Medicare Other | Admitting: Urology

## 2022-09-22 VITALS — BP 109/75 | HR 74 | Temp 97.9°F

## 2022-09-22 DIAGNOSIS — N4822 Cellulitis of corpus cavernosum and penis: Secondary | ICD-10-CM | POA: Diagnosis not present

## 2022-09-22 DIAGNOSIS — N5089 Other specified disorders of the male genital organs: Secondary | ICD-10-CM | POA: Diagnosis not present

## 2022-09-22 DIAGNOSIS — N4889 Other specified disorders of penis: Secondary | ICD-10-CM | POA: Diagnosis not present

## 2022-09-22 LAB — URINALYSIS, ROUTINE W REFLEX MICROSCOPIC
Bilirubin, UA: NEGATIVE
Glucose, UA: NEGATIVE
Ketones, UA: NEGATIVE
Leukocytes,UA: NEGATIVE
Nitrite, UA: NEGATIVE
Protein,UA: NEGATIVE
RBC, UA: NEGATIVE
Specific Gravity, UA: <1.005 — ABNORMAL LOW (ref 1.005–1.030)
Urobilinogen, Ur: 4 mg/dL — ABNORMAL HIGH (ref 0.2–1.0)
pH, UA: 8.5 — ABNORMAL HIGH (ref 5.0–7.5)

## 2022-09-22 LAB — BLADDER SCAN AMB NON-IMAGING: Scan Result: 42

## 2022-09-22 MED ORDER — SULFAMETHOXAZOLE-TRIMETHOPRIM 800-160 MG PO TABS
1.0000 | ORAL_TABLET | Freq: Two times a day (BID) | ORAL | 0 refills | Status: DC
Start: 2022-09-22 — End: 2022-10-04

## 2022-09-22 NOTE — Progress Notes (Deleted)
Name: Vernon Campbell DOB: 12/28/1940 MRN: [id number]  History of Present Illness: [name] is a 82 y.o. male who presents today for follow up visit at [facility] [place]. He resides at [facility]. He is accompanied by ***. - GU history: 1. BPH with LUTS (frequency, nocturia, urgency). - Taking Flomax.  - Exacerbated by diuretic use for CHF management with bilateral lower extremity lymphedema. Uses compression therapy.  At last visit with 09/22/2022: Seen for penoscrotal cellulitis. Treated with Bactrim DS 2x/day for 7 days and advised to elevate scrotum when sedentary.  Since last visit: ***  Today: He reports ***  He {Actions; denies-reports:120008} penile / scrotal swelling, redness, warmth, tenderness, swelling, or discharge. He {Actions; denies-reports:120008} fevers. He {Actions; denies-reports:120008} any acute urinary complaints.   Fall Screening: Do you usually have a device to assist in your mobility? {yes/no:20286} ***cane / ***walker / ***wheelchair   Medications: Current Outpatient Medications  Medication Sig Dispense Refill   albuterol (PROVENTIL) (2.5 MG/3ML) 0.083% nebulizer solution Take 3 mLs (2.5 mg total) by nebulization every 2 (two) hours as needed for shortness of breath. 75 mL 12   atorvastatin (LIPITOR) 20 MG tablet Take 20 mg by mouth daily.     fluticasone furoate-vilanterol (BREO ELLIPTA) 100-25 MCG/ACT AEPB Inhale 1 puff into the lungs daily. 28 each 3   HYDROcodone-acetaminophen (NORCO/VICODIN) 5-325 MG tablet Take 1 tablet by mouth every 8 (eight) hours as needed for moderate pain. 5 tablet 0   ketoconazole (NIZORAL) 2 % shampoo Apply 1 application  topically 2 (two) times a week. Apply to scalp (shampoo) topically every day shift every Tuesday and Friday for tinea versicolor (bath days)     lactulose (CHRONULAC) 10 GM/15ML solution Take 15 mLs by mouth daily as needed for mild constipation.     levothyroxine (SYNTHROID) 175 MCG tablet  Take 175 mcg by mouth daily before breakfast.     OXYGEN Inhale 2 L into the lungs continuous.     pantoprazole (PROTONIX) 40 MG tablet Take 1 tablet (40 mg total) by mouth daily for 10 days. 10 tablet 0   polyethylene glycol (MIRALAX / GLYCOLAX) 17 g packet Take 17 g by mouth daily.     potassium chloride SA (KLOR-CON) 20 MEQ tablet Take 20 mEq by mouth daily.     pramipexole (MIRAPEX) 1 MG tablet Take 1 tablet (1 mg total) by mouth at bedtime. 10 tablet 0   sennosides-docusate sodium (SENOKOT-S) 8.6-50 MG tablet Take 2 tablets by mouth at bedtime.     spironolactone (ALDACTONE) 50 MG tablet Take 50 mg by mouth daily.     sulfamethoxazole-trimethoprim (BACTRIM DS) 800-160 MG tablet Take 1 tablet by mouth every 12 (twelve) hours. 14 tablet 0   tamsulosin (FLOMAX) 0.4 MG CAPS capsule Take 1 capsule (0.4 mg total) by mouth daily after supper. 30 capsule 11   Torsemide 40 MG TABS Take 40 mg by mouth See admin instructions. Take 60 mg every morning and 40 mg every evening 120 tablet 2   No current facility-administered medications for this visit.    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)    Age-related physical debility    Aortic stenosis    Carpal tunnel syndrome, right    Chronic cor pulmonale (HCC)    Chronic diastolic heart failure (HCC)    Chronic respiratory failure  with hypoxia (HCC)    Combined forms of age-related cataract, bilateral    Constipation, chronic    Dermatochalasis of eyelid    Extreme obesity with alveolar hypoventilation (HCC)    History of tobacco use    Hyperlipidemia    Hypermetropia, bilateral    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 No family history on file. Social History   Socioeconomic History   Marital status: Single    Spouse name: Not on file   Number of children: Not on file   Years of education: Not on file   Highest education level: Not on file  Occupational History   Not on file  Tobacco Use   Smoking status: Former    Types: Cigarettes   Smokeless tobacco: Never  Vaping Use   Vaping Use: Never used  Substance and Sexual Activity   Alcohol use: Not Currently   Drug use: Not Currently   Sexual activity: Not Currently  Other Topics Concern   Not on file  Social History Narrative   Not on file   Social Determinants of Health   Financial Resource Strain: Not on file  Food Insecurity: No Food Insecurity (07/05/2022)   Hunger Vital Sign    Worried About Running Out of Food in the Last Year: Never true    Ran Out of Food in the Last Year: Never true  Transportation Needs: No Transportation Needs (07/05/2022)   PRAPARE - Administrator, Civil Service (Medical): No    Lack of Transportation (Non-Medical): No  Physical Activity: Not on file  Stress: Not on file  Social Connections: Not on file  Intimate Partner Violence: Not At Risk (07/05/2022)   Humiliation, Afraid, Rape, and Kick questionnaire    Fear of Current or Ex-Partner: No    Emotionally Abused: No    Physically Abused: No    Sexually Abused: No    Review of Systems Constitutional: Patient ***denies any unintentional weight loss or change in strength lntegumentary: Patient ***denies any rashes or pruritus Eyes: Patient denies ***dry eyes ENT:  Patient ***denies dry mouth Cardiovascular: Patient ***denies chest pain or syncope Respiratory: Patient ***denies shortness of breath Gastrointestinal: Patient ***denies nausea, vomiting, constipation, or diarrhea Musculoskeletal: Patient ***denies muscle cramps or weakness Neurologic: Patient ***denies convulsions or seizures Psychiatric: Patient ***denies memory problems Allergic/Immunologic: Patient ***denies recent allergic reaction(s) Hematologic/Lymphatic: Patient denies bleeding tendencies Endocrine: Patient ***denies heat/cold intolerance  GU: As per HPI.  OBJECTIVE There were no vitals filed for this visit. There is no height or weight on file to calculate BMI.  Physical Examination  Constitutional: ***No obvious distress; patient is ***non-toxic appearing  Cardiovascular: ***No visible lower extremity edema.  Respiratory: The patient does ***not have audible wheezing/stridor; respirations do ***not appear labored  Gastrointestinal: Abdomen ***non-distended Musculoskeletal: ***Normal ROM of UEs  Skin: ***No obvious rashes/open sores  Neurologic:  CN 2-12 grossly ***intact Psychiatric: Answered questions ***appropriately with ***normal affect  Hematologic/Lymphatic/Immunologic: ***No obvious bruises or sites of spontaneous bleeding  UA: {Desc; negative/positive:13464} for *** WBC/hpf, *** RBC/hpf, bacteria (***) *** nitrites, *** leukocytes, *** blood PVR: *** ml  ASSESSMENT No diagnosis found. ***  Will plan for follow up in *** months / ***1 year or sooner if needed. Pt verbalized understanding and agreement. All questions were answered.  PLAN Advised the following: 1. *** 2. ***No follow-ups on file.  No orders of the defined types were placed in this encounter.   It has been explained that the patient is to follow regularly with their PCP in addition to all other providers involved in their care and to follow instructions provided by these respective offices.  Patient advised to contact urology clinic if any urologic-pertaining questions, concerns, new symptoms or problems arise in the interim period.  There are no Patient Instructions on file for this visit.  Electronically signed by:  Donnita Falls, FNP   09/22/22    12:38 PM

## 2022-09-27 ENCOUNTER — Other Ambulatory Visit: Payer: Self-pay

## 2022-09-27 ENCOUNTER — Inpatient Hospital Stay (HOSPITAL_COMMUNITY)
Admission: EM | Admit: 2022-09-27 | Discharge: 2022-10-04 | DRG: 291 | Disposition: A | Discharge status: Skilled Nursing Facility | Payer: Medicare Other | Source: Skilled Nursing Facility | Attending: Internal Medicine | Admitting: Internal Medicine

## 2022-09-27 ENCOUNTER — Emergency Department (HOSPITAL_COMMUNITY): Payer: Medicare Other

## 2022-09-27 ENCOUNTER — Encounter (HOSPITAL_COMMUNITY): Payer: Self-pay

## 2022-09-27 DIAGNOSIS — I272 Pulmonary hypertension, unspecified: Secondary | ICD-10-CM | POA: Diagnosis present

## 2022-09-27 DIAGNOSIS — M7989 Other specified soft tissue disorders: Secondary | ICD-10-CM | POA: Diagnosis present

## 2022-09-27 DIAGNOSIS — E785 Hyperlipidemia, unspecified: Secondary | ICD-10-CM | POA: Diagnosis present

## 2022-09-27 DIAGNOSIS — I480 Paroxysmal atrial fibrillation: Secondary | ICD-10-CM | POA: Diagnosis present

## 2022-09-27 DIAGNOSIS — K219 Gastro-esophageal reflux disease without esophagitis: Secondary | ICD-10-CM | POA: Diagnosis present

## 2022-09-27 DIAGNOSIS — I10 Essential (primary) hypertension: Secondary | ICD-10-CM | POA: Diagnosis present

## 2022-09-27 DIAGNOSIS — J9621 Acute and chronic respiratory failure with hypoxia: Secondary | ICD-10-CM | POA: Diagnosis present

## 2022-09-27 DIAGNOSIS — E782 Mixed hyperlipidemia: Secondary | ICD-10-CM | POA: Diagnosis not present

## 2022-09-27 DIAGNOSIS — E662 Morbid (severe) obesity with alveolar hypoventilation: Secondary | ICD-10-CM | POA: Diagnosis not present

## 2022-09-27 DIAGNOSIS — Z9981 Dependence on supplemental oxygen: Secondary | ICD-10-CM

## 2022-09-27 DIAGNOSIS — E039 Hypothyroidism, unspecified: Secondary | ICD-10-CM | POA: Diagnosis present

## 2022-09-27 DIAGNOSIS — G2581 Restless legs syndrome: Secondary | ICD-10-CM | POA: Diagnosis present

## 2022-09-27 DIAGNOSIS — Z66 Do not resuscitate: Secondary | ICD-10-CM | POA: Diagnosis present

## 2022-09-27 DIAGNOSIS — I5033 Acute on chronic diastolic (congestive) heart failure: Secondary | ICD-10-CM | POA: Diagnosis present

## 2022-09-27 DIAGNOSIS — I110 Hypertensive heart disease with heart failure: Secondary | ICD-10-CM | POA: Diagnosis present

## 2022-09-27 DIAGNOSIS — J9622 Acute and chronic respiratory failure with hypercapnia: Secondary | ICD-10-CM | POA: Diagnosis present

## 2022-09-27 DIAGNOSIS — I509 Heart failure, unspecified: Secondary | ICD-10-CM

## 2022-09-27 DIAGNOSIS — I872 Venous insufficiency (chronic) (peripheral): Secondary | ICD-10-CM | POA: Diagnosis present

## 2022-09-27 DIAGNOSIS — Z7189 Other specified counseling: Secondary | ICD-10-CM | POA: Diagnosis not present

## 2022-09-27 DIAGNOSIS — J441 Chronic obstructive pulmonary disease with (acute) exacerbation: Secondary | ICD-10-CM | POA: Diagnosis present

## 2022-09-27 DIAGNOSIS — I350 Nonrheumatic aortic (valve) stenosis: Secondary | ICD-10-CM

## 2022-09-27 DIAGNOSIS — Z79899 Other long term (current) drug therapy: Secondary | ICD-10-CM

## 2022-09-27 DIAGNOSIS — N400 Enlarged prostate without lower urinary tract symptoms: Secondary | ICD-10-CM | POA: Diagnosis present

## 2022-09-27 DIAGNOSIS — Z87891 Personal history of nicotine dependence: Secondary | ICD-10-CM | POA: Diagnosis not present

## 2022-09-27 DIAGNOSIS — G4733 Obstructive sleep apnea (adult) (pediatric): Secondary | ICD-10-CM | POA: Diagnosis not present

## 2022-09-27 DIAGNOSIS — N4822 Cellulitis of corpus cavernosum and penis: Secondary | ICD-10-CM | POA: Diagnosis present

## 2022-09-27 DIAGNOSIS — N492 Inflammatory disorders of scrotum: Secondary | ICD-10-CM | POA: Diagnosis not present

## 2022-09-27 DIAGNOSIS — Z6841 Body Mass Index (BMI) 40.0 and over, adult: Secondary | ICD-10-CM

## 2022-09-27 DIAGNOSIS — I5031 Acute diastolic (congestive) heart failure: Secondary | ICD-10-CM | POA: Diagnosis present

## 2022-09-27 DIAGNOSIS — N5089 Other specified disorders of the male genital organs: Secondary | ICD-10-CM | POA: Diagnosis present

## 2022-09-27 DIAGNOSIS — I2729 Other secondary pulmonary hypertension: Secondary | ICD-10-CM | POA: Diagnosis present

## 2022-09-27 DIAGNOSIS — Z79890 Hormone replacement therapy (postmenopausal): Secondary | ICD-10-CM

## 2022-09-27 DIAGNOSIS — I2510 Atherosclerotic heart disease of native coronary artery without angina pectoris: Secondary | ICD-10-CM | POA: Diagnosis present

## 2022-09-27 DIAGNOSIS — D472 Monoclonal gammopathy: Secondary | ICD-10-CM | POA: Diagnosis present

## 2022-09-27 DIAGNOSIS — N179 Acute kidney failure, unspecified: Secondary | ICD-10-CM | POA: Diagnosis present

## 2022-09-27 DIAGNOSIS — I4510 Unspecified right bundle-branch block: Secondary | ICD-10-CM | POA: Diagnosis present

## 2022-09-27 DIAGNOSIS — J449 Chronic obstructive pulmonary disease, unspecified: Secondary | ICD-10-CM | POA: Diagnosis present

## 2022-09-27 DIAGNOSIS — R0602 Shortness of breath: Principal | ICD-10-CM

## 2022-09-27 DIAGNOSIS — J9611 Chronic respiratory failure with hypoxia: Secondary | ICD-10-CM | POA: Diagnosis present

## 2022-09-27 DIAGNOSIS — Z89421 Acquired absence of other right toe(s): Secondary | ICD-10-CM

## 2022-09-27 DIAGNOSIS — Z515 Encounter for palliative care: Secondary | ICD-10-CM | POA: Diagnosis not present

## 2022-09-27 DIAGNOSIS — R54 Age-related physical debility: Secondary | ICD-10-CM | POA: Diagnosis present

## 2022-09-27 LAB — BLOOD GAS, VENOUS
Acid-Base Excess: 14.7 mmol/L — ABNORMAL HIGH (ref 0.0–2.0)
Bicarbonate: 45.9 mmol/L — ABNORMAL HIGH (ref 20.0–28.0)
Drawn by: 66297
O2 Saturation: 85.6 %
Patient temperature: 36.5
pCO2, Ven: 87 mmHg (ref 44–60)
pH, Ven: 7.33 (ref 7.25–7.43)
pO2, Ven: 51 mmHg — ABNORMAL HIGH (ref 32–45)

## 2022-09-27 LAB — BLOOD GAS, ARTERIAL
Acid-Base Excess: 13.4 mmol/L — ABNORMAL HIGH (ref 0.0–2.0)
Bicarbonate: 44.8 mmol/L — ABNORMAL HIGH (ref 20.0–28.0)
Drawn by: 41977
FIO2: 36 %
O2 Saturation: 97.7 %
Patient temperature: 37
pCO2 arterial: 91 mmHg (ref 32–48)
pH, Arterial: 7.3 — ABNORMAL LOW (ref 7.35–7.45)
pO2, Arterial: 84 mmHg (ref 83–108)

## 2022-09-27 LAB — BASIC METABOLIC PANEL
Anion gap: 7 (ref 5–15)
BUN: 30 mg/dL — ABNORMAL HIGH (ref 8–23)
CO2: 37 mmol/L — ABNORMAL HIGH (ref 22–32)
Calcium: 8.7 mg/dL — ABNORMAL LOW (ref 8.9–10.3)
Chloride: 92 mmol/L — ABNORMAL LOW (ref 98–111)
Creatinine, Ser: 1.46 mg/dL — ABNORMAL HIGH (ref 0.61–1.24)
GFR, Estimated: 47 mL/min — ABNORMAL LOW (ref >60)
Glucose, Bld: 109 mg/dL — ABNORMAL HIGH (ref 70–99)
Potassium: 4.6 mmol/L (ref 3.5–5.1)
Sodium: 136 mmol/L (ref 135–145)

## 2022-09-27 LAB — CBC
HCT: 49.4 % (ref 39.0–52.0)
Hemoglobin: 14.8 g/dL (ref 13.0–17.0)
MCH: 31.4 pg (ref 26.0–34.0)
MCHC: 30 g/dL (ref 30.0–36.0)
MCV: 104.9 fL — ABNORMAL HIGH (ref 80.0–100.0)
Platelets: 177 10*3/uL (ref 150–400)
RBC: 4.71 MIL/uL (ref 4.22–5.81)
RDW: 15.9 % — ABNORMAL HIGH (ref 11.5–15.5)
WBC: 8.6 10*3/uL (ref 4.0–10.5)
nRBC: 0 % (ref 0.0–0.2)

## 2022-09-27 LAB — HIV ANTIBODY (ROUTINE TESTING W REFLEX): HIV Screen 4th Generation wRfx: NONREACTIVE

## 2022-09-27 LAB — LACTIC ACID, PLASMA: Lactic Acid, Venous: 1.2 mmol/L (ref 0.5–1.9)

## 2022-09-27 LAB — MRSA NEXT GEN BY PCR, NASAL: MRSA by PCR Next Gen: NOT DETECTED

## 2022-09-27 LAB — BRAIN NATRIURETIC PEPTIDE: B Natriuretic Peptide: 28 pg/mL (ref 0.0–100.0)

## 2022-09-27 LAB — MAGNESIUM: Magnesium: 2.2 mg/dL (ref 1.7–2.4)

## 2022-09-27 MED ORDER — ASPIRIN 81 MG PO TBEC
81.0000 mg | DELAYED_RELEASE_TABLET | Freq: Every day | ORAL | Status: DC
  Administered 2022-09-28 – 2022-10-04 (×6): 81 mg via ORAL
  Filled 2022-09-27 (×7): qty 1

## 2022-09-27 MED ORDER — POTASSIUM CHLORIDE CRYS ER 20 MEQ PO TBCR
20.0000 meq | EXTENDED_RELEASE_TABLET | Freq: Two times a day (BID) | ORAL | Status: DC
  Administered 2022-09-27 – 2022-10-04 (×14): 20 meq via ORAL
  Filled 2022-09-27 (×14): qty 1

## 2022-09-27 MED ORDER — ATORVASTATIN CALCIUM 20 MG PO TABS
20.0000 mg | ORAL_TABLET | Freq: Every day | ORAL | Status: DC
  Administered 2022-09-27 – 2022-10-03 (×7): 20 mg via ORAL
  Filled 2022-09-27 (×7): qty 1

## 2022-09-27 MED ORDER — LEVOTHYROXINE SODIUM 50 MCG PO TABS
175.0000 ug | ORAL_TABLET | Freq: Every day | ORAL | Status: DC
  Administered 2022-09-28 – 2022-10-04 (×7): 175 ug via ORAL
  Filled 2022-09-27 (×7): qty 1

## 2022-09-27 MED ORDER — SENNOSIDES-DOCUSATE SODIUM 8.6-50 MG PO TABS
2.0000 | ORAL_TABLET | Freq: Every day | ORAL | Status: DC
  Administered 2022-09-27 – 2022-10-03 (×6): 2 via ORAL
  Filled 2022-09-27 (×7): qty 2

## 2022-09-27 MED ORDER — SPIRONOLACTONE 25 MG PO TABS
50.0000 mg | ORAL_TABLET | Freq: Every day | ORAL | Status: DC
  Administered 2022-09-28 – 2022-10-03 (×6): 50 mg via ORAL
  Filled 2022-09-27 (×6): qty 2

## 2022-09-27 MED ORDER — FLUTICASONE FUROATE-VILANTEROL 100-25 MCG/ACT IN AEPB
1.0000 | INHALATION_SPRAY | Freq: Every day | RESPIRATORY_TRACT | Status: DC
  Administered 2022-09-28 – 2022-10-04 (×7): 1 via RESPIRATORY_TRACT
  Filled 2022-09-27: qty 28

## 2022-09-27 MED ORDER — METHYLPREDNISOLONE SODIUM SUCC 40 MG IJ SOLR
40.0000 mg | Freq: Two times a day (BID) | INTRAMUSCULAR | Status: DC
  Administered 2022-09-27 – 2022-10-01 (×8): 40 mg via INTRAVENOUS
  Filled 2022-09-27 (×8): qty 1

## 2022-09-27 MED ORDER — FUROSEMIDE 10 MG/ML IJ SOLN
80.0000 mg | Freq: Once | INTRAMUSCULAR | Status: AC
  Administered 2022-09-27: 80 mg via INTRAVENOUS
  Filled 2022-09-27: qty 8

## 2022-09-27 MED ORDER — FUROSEMIDE 10 MG/ML IJ SOLN
80.0000 mg | Freq: Two times a day (BID) | INTRAMUSCULAR | Status: DC
  Administered 2022-09-27 – 2022-10-01 (×9): 80 mg via INTRAVENOUS
  Filled 2022-09-27 (×9): qty 8

## 2022-09-27 MED ORDER — PRAMIPEXOLE DIHYDROCHLORIDE 1 MG PO TABS
1.0000 mg | ORAL_TABLET | Freq: Every day | ORAL | Status: DC
  Administered 2022-09-27 – 2022-10-03 (×7): 1 mg via ORAL
  Filled 2022-09-27 (×7): qty 1

## 2022-09-27 MED ORDER — TAMSULOSIN HCL 0.4 MG PO CAPS
0.4000 mg | ORAL_CAPSULE | Freq: Every day | ORAL | Status: DC
  Administered 2022-09-27 – 2022-10-03 (×7): 0.4 mg via ORAL
  Filled 2022-09-27 (×7): qty 1

## 2022-09-27 MED ORDER — SODIUM CHLORIDE 0.9 % IV SOLN: 250.0000 mL | INTRAVENOUS | Status: DC | PRN

## 2022-09-27 MED ORDER — POLYETHYLENE GLYCOL 3350 17 G PO PACK
17.0000 g | PACK | Freq: Every day | ORAL | Status: DC
  Administered 2022-10-03: 17 g via ORAL
  Filled 2022-09-27 (×6): qty 1

## 2022-09-27 MED ORDER — ENOXAPARIN SODIUM 40 MG/0.4ML IJ SOSY: 40.0000 mg | PREFILLED_SYRINGE | INTRAMUSCULAR | Status: DC

## 2022-09-27 MED ORDER — ACETAMINOPHEN 325 MG PO TABS
650.0000 mg | ORAL_TABLET | ORAL | Status: DC | PRN
  Administered 2022-09-30 – 2022-10-02 (×4): 650 mg via ORAL
  Filled 2022-09-27 (×4): qty 2

## 2022-09-27 MED ORDER — HYDROCODONE-ACETAMINOPHEN 5-325 MG PO TABS
1.0000 | ORAL_TABLET | Freq: Three times a day (TID) | ORAL | Status: DC | PRN
  Administered 2022-09-28 – 2022-10-03 (×11): 1 via ORAL
  Filled 2022-09-27 (×11): qty 1

## 2022-09-27 MED ORDER — SODIUM CHLORIDE 0.9% FLUSH
3.0000 mL | INTRAVENOUS | Status: DC | PRN
  Administered 2022-09-30: 3 mL via INTRAVENOUS

## 2022-09-27 MED ORDER — DOXYCYCLINE HYCLATE 100 MG PO TABS
100.0000 mg | ORAL_TABLET | Freq: Two times a day (BID) | ORAL | Status: AC
  Administered 2022-09-27 – 2022-10-02 (×10): 100 mg via ORAL
  Filled 2022-09-27 (×10): qty 1

## 2022-09-27 MED ORDER — LACTULOSE 10 GM/15ML PO SOLN: 10.0000 g | Freq: Every day | ORAL | Status: DC | PRN

## 2022-09-27 MED ORDER — ONDANSETRON HCL 4 MG/2ML IJ SOLN: 4.0000 mg | Freq: Four times a day (QID) | INTRAMUSCULAR | Status: DC | PRN

## 2022-09-27 MED ORDER — IPRATROPIUM-ALBUTEROL 0.5-2.5 (3) MG/3ML IN SOLN
3.0000 mL | RESPIRATORY_TRACT | Status: DC
  Administered 2022-09-27 – 2022-09-28 (×4): 3 mL via RESPIRATORY_TRACT
  Filled 2022-09-27 (×4): qty 3

## 2022-09-27 MED ORDER — HYDROCODONE-ACETAMINOPHEN 5-325 MG PO TABS
1.0000 | ORAL_TABLET | Freq: Once | ORAL | Status: AC
  Administered 2022-09-27: 1 via ORAL
  Filled 2022-09-27: qty 1

## 2022-09-27 MED ORDER — SODIUM CHLORIDE 0.9% FLUSH
3.0000 mL | Freq: Two times a day (BID) | INTRAVENOUS | Status: DC
  Administered 2022-09-27 – 2022-10-04 (×12): 3 mL via INTRAVENOUS

## 2022-09-27 MED ORDER — ENOXAPARIN SODIUM 60 MG/0.6ML IJ SOSY
60.0000 mg | PREFILLED_SYRINGE | INTRAMUSCULAR | Status: DC
  Administered 2022-09-27: 60 mg via SUBCUTANEOUS
  Filled 2022-09-27: qty 0.6

## 2022-09-27 MED ORDER — PANTOPRAZOLE SODIUM 40 MG PO TBEC
40.0000 mg | DELAYED_RELEASE_TABLET | Freq: Every day | ORAL | Status: DC
  Administered 2022-09-27 – 2022-10-03 (×7): 40 mg via ORAL
  Filled 2022-09-27 (×7): qty 1

## 2022-09-27 NOTE — ED Provider Notes (Signed)
[place] EMERGENCY DEPARTMENT AT [facility] Provider Note   CSN: [id number] Arrival date & time: 09/27/22  1013     History  Chief Complaint  Patient presents with   Shortness of Breath    Vernon Campbell is a 82 y.o. male with history including CAD, hypertension, obesity hypoventilation syndrome, COPD and history of CHF presenting for evaluation of increasing shortness of breath and a reported 10 pound weight gain over the past 5 days.  He presents from a local nursing facility, he is chronically on 3 L of oxygen nasal cannula, presented on his 3 L in the mid 80% range, maintaining 93 to 94% on 4 L.  He denies fevers or chills, he endorses this feels like his fluid overload.  He has significant swelling in his lower extremities, he presents with bilateral Unna boots which he states is due to be replaced tomorrow.  He denies chest pain, denies abdominal pain.  He has had little to no sleep over the past 24 hours, per EMS he was found to be hallucinating on the way here which they state is not normal for him.  The history is provided by the patient.       Home Medications Prior to Admission medications   Medication Sig Start Date End Date Taking? Authorizing Provider  albuterol (PROVENTIL) (2.5 MG/3ML) 0.083% nebulizer solution Take 3 mLs (2.5 mg total) by nebulization every 2 (two) hours as needed for shortness of breath. 12/15/21   Shon Hale, MD  atorvastatin (LIPITOR) 20 MG tablet Take 20 mg by mouth daily.    [provider]  fluticasone furoate-vilanterol (BREO ELLIPTA) 100-25 MCG/ACT AEPB Inhale 1 puff into the lungs daily. 12/16/21   Shon Hale, MD  HYDROcodone-acetaminophen (NORCO/VICODIN) 5-325 MG tablet Take 1 tablet by mouth every 8 (eight) hours as needed for moderate pain. 07/07/22   Sherryll Burger, Pratik D, DO  ketoconazole (NIZORAL) 2 % shampoo Apply 1 application  topically 2 (two) times a week. Apply to scalp (shampoo) topically every day shift every  Tuesday and Friday for tinea versicolor (bath days)    [provider]  lactulose (CHRONULAC) 10 GM/15ML solution Take 15 mLs by mouth daily as needed for mild constipation.    [provider]  levothyroxine (SYNTHROID) 175 MCG tablet Take 175 mcg by mouth daily before breakfast.    [provider]  OXYGEN Inhale 2 L into the lungs continuous.    [provider]  pantoprazole (PROTONIX) 40 MG tablet Take 1 tablet (40 mg total) by mouth daily for 10 days. 07/07/22 07/17/22  Sherryll Burger, Pratik D, DO  polyethylene glycol (MIRALAX / GLYCOLAX) 17 g packet Take 17 g by mouth daily.    [provider]  potassium chloride SA (KLOR-CON) 20 MEQ tablet Take 20 mEq by mouth daily.    [provider]  pramipexole (MIRAPEX) 1 MG tablet Take 1 tablet (1 mg total) by mouth at bedtime. 07/07/22   Sherryll Burger, Pratik D, DO  sennosides-docusate sodium (SENOKOT-S) 8.6-50 MG tablet Take 2 tablets by mouth at bedtime.    [provider]  spironolactone (ALDACTONE) 50 MG tablet Take 50 mg by mouth daily.    [provider]  sulfamethoxazole-trimethoprim (BACTRIM DS) 800-160 MG tablet Take 1 tablet by mouth every 12 (twelve) hours. 09/22/22   Donnita Falls, FNP  tamsulosin (FLOMAX) 0.4 MG CAPS capsule Take 1 capsule (0.4 mg total) by mouth daily after supper. 09/06/22   Donnita Falls, FNP  Torsemide 40  MG TABS Take 40 mg by mouth See admin instructions. Take 60 mg every morning and 40 mg every evening 12/15/21   Shon Hale, MD      Allergies    Patient has no known allergies.    Review of Systems   Review of Systems  Constitutional:  Negative for fever.  Respiratory:  Positive for shortness of breath.   Cardiovascular:  Positive for leg swelling. Negative for chest pain and palpitations.  Psychiatric/Behavioral:  Positive for hallucinations and sleep disturbance.     Physical Exam Updated Vital Signs BP (!) 123/52 (BP Location: Left Arm)   Pulse 83    Temp 97.9 F (36.6 C) (Oral)   Resp 20   Ht 5\' 10"  (1.778 m)   Wt 117.9 kg   SpO2 95%   BMI 37.31 kg/m  Physical Exam Vitals and nursing note reviewed.  Constitutional:      Appearance: He is well-developed.  HENT:     Head: Normocephalic and atraumatic.  Eyes:     Conjunctiva/sclera: Conjunctivae normal.  Cardiovascular:     Rate and Rhythm: Normal rate and regular rhythm.     Heart sounds: Normal heart sounds.  Pulmonary:     Effort: Pulmonary effort is normal.     Breath sounds: Decreased breath sounds present. No wheezing.  Abdominal:     General: Bowel sounds are normal.     Palpations: Abdomen is soft.     Tenderness: There is no abdominal tenderness.  Musculoskeletal:        General: Normal range of motion.     Cervical back: Normal range of motion.  Skin:    General: Skin is warm and dry.     Findings: Erythema present.     Comments: Bilateral thigh erythema, patient presenting in bilateral Unna boots to the upper tibia's.,  also some mild erythema lower abdominal wall.  Significant pitting edema to mid thighs, scrotal edema.   Neurological:     Mental Status: He is alert.     ED Results / Procedures / Treatments   Labs (all labs ordered are listed, but only abnormal results are displayed) Labs Reviewed  BASIC METABOLIC PANEL - Abnormal; Notable for the following components:      Result Value   Chloride 92 (*)    CO2 37 (*)    Glucose, Bld 109 (*)    BUN 30 (*)    Creatinine, Ser 1.46 (*)    Calcium 8.7 (*)    GFR, Estimated 47 (*)    All other components within normal limits  CBC - Abnormal; Notable for the following components:   MCV 104.9 (*)    RDW 15.9 (*)    All other components within normal limits  MAGNESIUM  BRAIN NATRIURETIC PEPTIDE  BLOOD GAS, ARTERIAL    EKG EKG Interpretation Date/Time:  Monday September 27 2022 10:35:37 EDT Ventricular Rate:  82 PR Interval:  150 QRS Duration:  156 QT Interval:  394 QTC Calculation: 461 R  Axis:   265  Text Interpretation: Sinus rhythm Right bundle branch block Inferior infarct, old Abnormal lateral Q waves No significant change since prior 4/24 Confirmed by Meridee Score [phone]) on 09/27/2022 12:22:30 PM  Radiology DG Chest Portable 1 View  Result Date: 09/27/2022 CLINICAL DATA:  sob EXAM: PORTABLE CHEST 1 VIEW COMPARISON:  CXR 07/05/22 FINDINGS: Cardiomegaly. Trace left pleural effusion. Unchanged hazy opacity at the left lung base could represent atelectasis or infection. Prominent bilateral interstitial opacities most likely represent  pulmonary venous congestion. No radiographically apparent displaced rib fractures. Visualized upper abdomen is notable for colonic gaseous distention. IMPRESSION: 1. Cardiomegaly with pulmonary venous congestion. 2. Trace left pleural effusion. Electronically Signed   By: Lorenza Cambridge M.D.   On: 09/27/2022 11:41    Procedures Procedures    Medications Ordered in ED Medications  furosemide (LASIX) injection 80 mg (80 mg Intravenous Given 09/27/22 1207)  HYDROcodone-acetaminophen (NORCO/VICODIN) 5-325 MG per tablet 1 tablet (1 tablet Oral Given 09/27/22 1253)    ED Course/ Medical Decision Making/ A&P                             Medical Decision Making Patient with a history of COPD and CHF, also CAD and hypertension presenting with increased shortness of breath and a 10 pound weight gain over the past 5 days, requiring increased home oxygen from 3 to 4 L to maintain O2 sats over 93%.  He denies chest pain but has had increased peripheral edema and scrotal edema.  Orthopnea.  Exam and history suggesting worsening CHF.  He has been compliant with his diuretics, he is on torsemide and spironolactone.  He presents from a local nursing home.  He will need diuresis, he was given Lasix 80 mg, will need admission for further diuresis.  Patient denies chest pain, denies palpitations, doubt this could represent PE.  Amount and/or Complexity of Data  Reviewed Labs: ordered.    Details: Labs are significant for creatinine of 1.46, he has a normal WBC count at 8.6, hemoglobin is 14.8.  Although he has a BNP of 28, clinically he is in failure. Radiology: ordered.    Details: Chest x-ray significant for trace pleural effusion, pulmonary vascular congestion.  Cardiomegaly. Discussion of management or test interpretation with external provider(s): Patient discussed with Dr. Laural Benes of the hospitalist service who accepts patient for admission.  Risk Decision regarding hospitalization.           Final Clinical Impression(s) / ED Diagnoses Final diagnoses:  SOB (shortness of breath)  Acute on chronic congestive heart failure, unspecified heart failure type [facility])    Rx / DC Orders ED Discharge Orders     None         Victoriano Lain 09/27/22 1306    Terrilee Files, MD 09/27/22 1815

## 2022-09-27 NOTE — ED Notes (Signed)
Respiratory called for bipap at this time.

## 2022-09-27 NOTE — ED Notes (Signed)
PCO2 91 Informed MD

## 2022-09-27 NOTE — ED Notes (Signed)
ED TO INPATIENT HANDOFF REPORT  ED Nurse Name and Phone #: Jennette Kettle [phone]  S Name/Age/Gender Vernon Campbell 82 y.o. male Room/Bed: APA07/APA07  Code Status   Code Status: Prior  Home/SNF/Other Skilled nursing facility Patient oriented to: self, place, time, and situation Is this baseline? 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 Pt states he is suppose to be weighed daily but has not been. Pt has had a 10 lb weight increase in the past 5 days. Pt states pain and swelling in his legs, abdomen, and scrotum. Pt on 3 L at baseline in the 80s, pt increased to 4L increased 93-94%. Pt has hx of CHF. Per EMS pt is having some hullicinations on the way here seeing cliffs that aren't there and children playing that are not there. Per EMS pt usually A & O x 4.   Allergies No Known Allergies  Level of Care/Admitting Diagnosis ED Disposition     ED Disposition  Admit   Condition  --   Comment  Hospital Area: [facility] [[id number]]  Level of Care: Stepdown [14]  Covid Evaluation: Asymptomatic - no recent exposure (last 10 days) testing not required  Diagnosis: Acute heart failure with preserved ejection fraction [facility]) [[id number]]  Admitting Physician: Cleora Fleet [[id number]]  Attending Physician: Cleora Fleet [[id number]]  Certification:: I certify this patient will need inpatient services for at least 2 midnights          B Medical/Surgery History 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,  bilateral    Constipation, chronic    Dermatochalasis of eyelid    Extreme obesity with alveolar hypoventilation (HCC)    History of tobacco use    Hyperlipidemia    Hypermetropia, bilateral    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 No pertinent surgical history.   A IV Location/Drains/Wounds Patient Lines/Drains/Airways Status     Active Line/Drains/Airways     Name Placement date Placement time Site Days   Peripheral IV 09/27/22 20 G Anterior;Proximal;Right Forearm 09/27/22  1156  Forearm  less than 1   External Urinary Catheter 09/27/22  1210  --  less than 1            Intake/Output Last 24 hours  Intake/Output Summary (Last 24 hours) at 09/27/2022 1410 Last data filed at 09/27/2022 1242 Gross per 24 hour  Intake --  Output 500 ml  Net -500 ml    Labs/Imaging Results for orders placed or performed during the hospital encounter of 09/27/22 (from the past 48 hour(s))  Basic metabolic panel     Status: Abnormal   Collection Time: 09/27/22 12:04 PM  Result Value Ref Range   Sodium 136 135 - 145 mmol/L   Potassium 4.6 3.5 - 5.1 mmol/L   Chloride 92 (L) 98 - 111 mmol/L   CO2 37 (H) 22 - 32 mmol/L   Glucose, Bld 109 (H) 70 - 99 mg/dL    Comment: Glucose reference range applies only to samples taken after fasting for at least 8 hours.   BUN 30 (H) 8 - 23 mg/dL   Creatinine, Ser 1.61 (H) 0.61 - 1.24 mg/dL   Calcium 8.7 (L)  8.9 - 10.3 mg/dL   GFR, Estimated 47 (L) >60 mL/min    Comment: (NOTE) Calculated using the CKD-EPI Creatinine Equation (2021)    Anion gap 7 5 - 15    Comment: Performed at [facility], [address] [place], [place] [address]  Magnesium     Status: None   Collection Time: 09/27/22 12:04 PM  Result Value Ref Range   Magnesium 2.2 1.7 - 2.4 mg/dL    Comment: Performed at [facility], [address], [place], [place] [address]  Brain natriuretic peptide (order ONLY if patient c/o SOB)     Status: None   Collection Time: 09/27/22 12:04 PM  Result Value Ref Range   B Natriuretic Peptide 28.0 0.0 - 100.0 pg/mL    Comment: Performed at [facility], [address] [place], [place] [address]  CBC     Status: Abnormal   Collection Time: 09/27/22 12:04 PM  Result Value Ref Range   WBC 8.6 4.0 - 10.5 K/uL   RBC 4.71 4.22 - 5.81 MIL/uL   Hemoglobin 14.8 13.0 - 17.0 g/dL   HCT 78.2 95.6 - 21.3 %   MCV 104.9 (H) 80.0 - 100.0 fL   MCH 31.4 26.0 - 34.0 pg   MCHC 30.0 30.0 - 36.0 g/dL   RDW 08.6 (H) 57.8 - 46.9 %   Platelets 177 150 - 400 K/uL   nRBC 0.0 0.0 - 0.2 %    Comment: Performed at [facility], [address], [place], [place] [address]  Blood gas, arterial (at [facility])     Status: Abnormal   Collection Time: 09/27/22  1:00 PM  Result Value Ref Range   FIO2 36.00 %   pH, Arterial 7.3 (L) 7.35 - 7.45   pCO2 arterial 91 (HH) 32 - 48 mmHg    Comment: CRITICAL RESULT CALLED TO, READ BACK BY AND VERIFIED WITH: BOBBIE GREENWOOD @ 1308 ON 09/27/22 C VARNER    pO2, Arterial 84 83 - 108 mmHg   Bicarbonate 44.8 (H) 20.0 - 28.0 mmol/L   Acid-Base Excess 13.4 (H) 0.0 - 2.0 mmol/L   O2 Saturation 97.7 %   Patient temperature 37.0    Collection site RIGHT RADIAL    Drawn by [id number]    Allens test (pass/fail) PASS PASS    Comment: Performed at [facility], [address], [place], [place] [address]   DG Chest Portable 1 View  Result Date: 09/27/2022 CLINICAL DATA:  sob EXAM: PORTABLE  CHEST 1 VIEW COMPARISON:  CXR 07/05/22 FINDINGS: Cardiomegaly. Trace left pleural effusion. Unchanged hazy opacity at the left lung base could represent atelectasis or infection. Prominent bilateral interstitial opacities most  likely represent pulmonary venous congestion. No radiographically apparent displaced rib fractures. Visualized upper abdomen is notable for colonic gaseous distention. IMPRESSION: 1. Cardiomegaly with pulmonary venous congestion. 2. Trace left pleural effusion. Electronically Signed   By: Lorenza Cambridge M.D.   On: 09/27/2022 11:41    Pending Labs Unresulted Labs (From admission, onward)    None       Vitals/Pain Today's Vitals   09/27/22 1327 09/27/22 1400 09/27/22 1409 09/27/22 1409  BP: 113/82 (!) 121/59    Pulse: 85 86    Resp: (!) 26 20    Temp:    (!) 97.4 F (36.3 C)  TempSrc:    Axillary  SpO2: 95% 95%    Weight:      Height:      PainSc:   10-Worst pain ever     Isolation Precautions No active isolations  Medications Medications  furosemide (LASIX) injection 80 mg (80 mg Intravenous Given 09/27/22 1207)  HYDROcodone-acetaminophen (NORCO/VICODIN) 5-325 MG per tablet 1 tablet (1 tablet Oral Given 09/27/22 1253)    Mobility power wheelchair     Focused Assessments Pulmonary Assessment Handoff:  Lung sounds: Bilateral Breath Sounds: Diminished O2 Device: Nasal Cannula O2 Flow Rate (L/min): 4 L/min    R Recommendations: See Admitting Provider Note  Report given to: Norva Pavlov, RN  Additional Notes: n/a

## 2022-09-27 NOTE — ED Triage Notes (Signed)
Pt called EMS for shortness of breath. Pt states he is suppose to be weighed daily but has not been. Pt has had a 10 lb weight increase in the past 5 days. Pt states pain and swelling in his legs, abdomen, and scrotum. Pt on 3 L at baseline in the 80s, pt increased to 4L increased 93-94%. Pt has hx of CHF. Per EMS pt is having some hullicinations on the way here seeing cliffs that aren't there and children playing that are not there. Per EMS pt usually A & O x 4.

## 2022-09-27 NOTE — H&P (Addendum)
History and Physical    Vernon Campbell WJX:[id number] DOB: [birth year] DOA: 09/27/2022   Patient coming from: [facility]  Chief Complaint: Dyspnea  HPI: Vernon Campbell is a 82 y.o. male with medical history significant for HFpEF, COPD c/b chronic respiratory failure with hypoxia (on baseline 3L [place]), CAD, HTN, obesity-hypoventilation syndrome, HLD, hypothyroidism, OSA and restless leg syndrome who presents with increasing dyspnea and 10 pound weight gain over the last few days, and is admitted for treatment and management of his acute HFpEF exacerbation.   Vernon Campbell notes that he began having worsened SOB over the last 2-3 days. He also notes that he isn't weighed every day, but has gained at least ten pounds in the last 5 days. He denies chest pain. His O2 requirement is increased from baseline 3L to 4-5L currently. While being transported to [facility] ED via EMS, he reportedly had a few hallucinations, not consistent with his baseline, but adds that he wasn't able to sleep last night. Typically, he takes Torsemide 60mg  qAM and 40mg  at bedtime, and wears Una boots. He has chronic venous stasis dermatitis and notes he also has restless legs.   Recently, he went to the Urologist on 7/3 and was diagnosed with penile cellulitis, for which he was prescribed double strength bactrim BID x7 days, which he has not completed.    ED Course:  Cr at 1.46 (b/l 0.8-1.1) and BNP of 28. However 7/8 CXR demonstrated prominent bilateral interstitial opacities consistent with venous congestion. Given 80mg  IV Lasix. Presenting ABG with 7.3/91/84/44.8 and moved to Bipap.   Review of Systems: Reviewed as noted above, otherwise negative.  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 No pertinent surgical history.   reports that he has quit smoking. His smoking use included cigarettes. He has never used smokeless tobacco. He reports that he does not currently use alcohol. He reports that he does not currently use drugs.  No Known Allergies  History reviewed. No pertinent family history.  Prior to Admission medications   Medication Sig Start Date End Date Taking? Authorizing Provider  albuterol (PROVENTIL) (2.5 MG/3ML) 0.083% nebulizer solution Take 3 mLs (2.5 mg total) by nebulization every 2 (two) hours  as needed for shortness of breath. 12/15/21   Shon Hale, MD  atorvastatin (LIPITOR) 20 MG tablet Take 20 mg by mouth daily.    [provider]  fluticasone furoate-vilanterol (BREO ELLIPTA) 100-25 MCG/ACT AEPB Inhale 1 puff into the lungs daily. 12/16/21   Shon Hale, MD  HYDROcodone-acetaminophen (NORCO/VICODIN) 5-325 MG tablet Take 1 tablet by mouth every 8 (eight) hours as needed for moderate pain. 07/07/22   Sherryll Burger, Pratik D, DO  ketoconazole (NIZORAL) 2 % shampoo  Apply 1 application  topically 2 (two) times a week. Apply to scalp (shampoo) topically every day shift every Tuesday and Friday for tinea versicolor (bath days)    [provider]  lactulose (CHRONULAC) 10 GM/15ML solution Take 15 mLs by mouth daily as needed for mild constipation.    [provider]  levothyroxine (SYNTHROID) 175 MCG tablet Take 175 mcg by mouth daily before breakfast.    [provider]  OXYGEN Inhale 2 L into the lungs continuous.    [provider]  pantoprazole (PROTONIX) 40 MG tablet Take 1 tablet (40 mg total) by mouth daily for 10 days. 07/07/22 07/17/22  Sherryll Burger, Pratik D, DO  polyethylene glycol (MIRALAX / GLYCOLAX) 17 g packet Take 17 g by mouth daily.    [provider]  potassium chloride SA (KLOR-CON) 20 MEQ tablet Take 20 mEq by mouth daily.    [provider]  pramipexole (MIRAPEX) 1 MG tablet Take 1 tablet (1 mg total) by mouth at bedtime. 07/07/22   Sherryll Burger, Pratik D, DO  sennosides-docusate sodium (SENOKOT-S) 8.6-50 MG tablet Take 2 tablets by mouth at bedtime.    [provider]  spironolactone (ALDACTONE) 50 MG tablet Take 50 mg by mouth daily.    [provider]  sulfamethoxazole-trimethoprim (BACTRIM DS) 800-160 MG tablet Take 1 tablet by mouth every 12 (twelve) hours. 09/22/22   Donnita Falls, FNP  tamsulosin (FLOMAX) 0.4 MG CAPS capsule Take 1 capsule (0.4 mg total) by mouth daily after supper. 09/06/22   Donnita Falls, FNP  Torsemide 40 MG TABS Take 40 mg by mouth See admin instructions. Take 60 mg every morning and 40 mg every evening 12/15/21   Shon Hale, MD    Physical Exam: Vitals:   09/27/22 1024 09/27/22 1026 09/27/22 1327  BP: (!) 123/52  113/82  Pulse: 83  85  Resp: 20  (!) 26  Temp: 97.9 F (36.6 C)    TempSrc: Oral    SpO2: 95%  95%  Weight:  117.9 kg   Height:  5\' 10"  (1.778 m)     Constitutional: NAD, calm, comfortable Vitals:   09/27/22 1024 09/27/22 1026  09/27/22 1327  BP: (!) 123/52  113/82  Pulse: 83  85  Resp: 20  (!) 26  Temp: 97.9 F (36.6 C)    TempSrc: Oral    SpO2: 95%  95%  Weight:  117.9 kg   Height:  5\' 10"  (1.778 m)    Eyes: lids and conjunctivae normal Neck: normal, supple Respiratory: On 4L [place] with normal respiratory effort. Mild crackles but diminished breath sounds bilaterally.  Cardiovascular: Regular rate and rhythm, prominent holosystolic blowing murmur heard best at bilateral upper sternal borders. Abdomen: Bowel sounds positive. No tenderness to palpation. Musculoskeletal:  3+ pitting edema to lower abdomen. Chronic venous stasis dermatitis changes over BLEs.  Skin: diffuse erythema over lower abdomen Psychiatric: Responsive affect   Labs on Admission: I have personally reviewed following labs and imaging studies  CBC:  Recent Labs  Lab 09/27/22 1204  WBC 8.6  HGB 14.8  HCT 49.4  MCV 104.9*  PLT 177   Basic Metabolic Panel: Recent Labs  Lab 09/27/22 1204  NA 136  K 4.6  CL 92*  CO2 37*  GLUCOSE 109*  BUN 30*  CREATININE 1.46*  CALCIUM 8.7*  MG 2.2   GFR: Estimated Creatinine Clearance: 48.5 mL/min (A) (by C-G formula based on SCr of 1.46 mg/dL (H)). Liver Function Tests: No results for input(s): "AST", "ALT", "ALKPHOS", "BILITOT", "PROT", "ALBUMIN" in the last 168 hours. No results for input(s): "LIPASE", "AMYLASE" in the last 168 hours. No results for input(s): "AMMONIA" in the last 168 hours. Coagulation Profile: No results for input(s): "INR", "PROTIME" in the last 168 hours. Cardiac Enzymes: No results for input(s): "CKTOTAL", "CKMB", "CKMBINDEX", "TROPONINI" in the last 168 hours. BNP (last 3 results) No results for input(s): "PROBNP" in the last 8760 hours. HbA1C: No results for input(s): "HGBA1C" in the last 72 hours. CBG: No results for input(s): "GLUCAP" in the last 168 hours. Lipid Profile: No results for input(s): "CHOL", "HDL", "LDLCALC", "TRIG", "CHOLHDL", "LDLDIRECT" in  the last 72 hours. Thyroid Function Tests: No results for input(s): "TSH", "T4TOTAL", "FREET4", "T3FREE", "THYROIDAB" in the last 72 hours. Anemia Panel: No results for input(s): "VITAMINB12", "FOLATE", "FERRITIN", "TIBC", "IRON", "RETICCTPCT" in the last 72 hours. Urine analysis:    Component Value Date/Time   APPEARANCEUR Clear 09/22/2022 1131   GLUCOSEU Negative 09/22/2022 1131   BILIRUBINUR Negative 09/22/2022 1131   PROTEINUR Negative 09/22/2022 1131   NITRITE Negative 09/22/2022 1131   LEUKOCYTESUR Negative 09/22/2022 1131    Radiological Exams on Admission: DG Chest Portable 1 View  Result Date: 09/27/2022 CLINICAL DATA:  sob EXAM: PORTABLE CHEST 1 VIEW COMPARISON:  CXR 07/05/22 FINDINGS: Cardiomegaly. Trace left pleural effusion. Unchanged hazy opacity at the left lung base could represent atelectasis or infection. Prominent bilateral interstitial opacities most likely represent pulmonary venous congestion. No radiographically apparent displaced rib fractures. Visualized upper abdomen is notable for colonic gaseous distention. IMPRESSION: 1. Cardiomegaly with pulmonary venous congestion. 2. Trace left pleural effusion. Electronically Signed   By: Lorenza Cambridge M.D.   On: 09/27/2022 11:41    EKG: Independently reviewed.   Assessment/Plan Principal Problem:   Acute heart failure with preserved ejection fraction (HCC) Active Problems:   Hyperlipidemia   Hypothyroidism   Chronic stasis dermatitis   Restless leg syndrome   Chronic respiratory failure with hypoxia (HCC)   COPD (chronic obstructive pulmonary disease) (HCC)   Coronary artery disease involving native coronary artery   Esophageal reflux   MGUS (monoclonal gammopathy of unknown significance)   Essential hypertension   Mild pulmonary hypertension (HCC)   Obesity hypoventilation syndrome (HCC)   OSA (obstructive sleep apnea)   BPH without urinary obstruction   Mod to Severe Aortic stenosis   Cellulitis of scrotum    Scrotal edema  Acute HFpEF Exacerbation Cr at 1.46 (b/l 0.8-1.1). 7/8 CXR demonstrated prominent bilateral interstitial opacities consistent with venous congestion. Given 80mg  IV Lasix. Presenting ABG with 7.3/91/84/44.8 and moved to Bipap. Pt with increased weight gain and progressive dyspnea over last few days, and takes Torsemide 60mg  qAM and 40mg  at bedtime. Despite BNP of 28, pt with clinical heart failure exacerbation. - Continue IV Lasix 80mg  BID, may need to escalate further - Spironolactone 50mg  - Strict I/Os - Daily weights  Acute on Chronic Hypoxic and Hypercapnic Respiratory Failure Presenting ABG with 7.3/91/84/44.8 and moved to Bipap, without somnolence, though  EMS reported pt had some visual hallucinations. 7/8 CXR demonstrated prominent bilateral interstitial opacities consistent with venous congestion. Will treat underlying insult with diuresis.  - Continue Bipap - Recheck ABG in 4 hours  COPD Exacerbation Some wheezing on exam and pt with increased O2 requirement. Though presenting with HFpEF symptoms and signs, cannot exclude overlapping COPD exacerbation, will empirically treat. - Duonebs q4 hours - IV Methylprednisolone 40mg  BID - Breo Ellipta daily - Doxycycline x 5 days ending 7/13, which will also cover cellulitis, as below.  AKI Cr at 1.46 (b/l 0.8-1.1) iso Bactrim (which does falsely elevate Creatinine) and pre-renal insult of acute HFpEF exacerbation and intravascular depletion. - Order UA - Will continue to monitor - Avoid nephrotoxic medications as able  Penile cellulitis Was treated with double strength Bactrim after 7/3 outpatient Urology visit. Would have completed course by Wednesday 7/10, but in setting of AKI and likely COPD exacerbation, will switch to Doxycycline. - Begin Doxycycline for 5 days  Chronic venous stasis dermatitis Wears leg wraps (Una boots) which are changed twice a week.  - Continue leg wraps while admitted  Restless Leg  Syndrome - Continue Norco-Vicodin 5-325mg  - Continue Pramipixole 1mg   CAD - Continue 81mg  ASA  - Continue Atorvastatin 20mg   Hypothyroidism - Continue Levothyroxine  BPH - Flomax 0.4mg   DVT prophylaxis: Lovenox Code Status: DNR Family Communication: Will ask pt Disposition Plan: Back to [facility] Consults called: None Admission status:   Severity of Illness: The appropriate patient status for this patient is INPATIENT. Inpatient status is judged to be reasonable and necessary in order to provide the required intensity of service to ensure the patient's safety. The patient's presenting symptoms, physical exam findings, and initial radiographic and laboratory data in the context of their chronic comorbidities is felt to place them at high risk for further clinical deterioration. Furthermore, it is not anticipated that the patient will be medically stable for discharge from the hospital within 2 midnights of admission.   * I certify that at the point of admission it is my clinical judgment that the patient will require inpatient hospital care spanning beyond 2 midnights from the point of admission due to high intensity of service, high risk for further deterioration and high frequency of surveillance required.*  Signed,  Marcelline Mates, MS4 Working with Dr. Standley Dakins  ATTENDING NOTE  Patient seen and examined with Marcelline Mates, Medical student. In addition to supervising the encounter, I played a key role in the decision making process as well as reviewed key findings.    Pt is massively volume overloaded.  He has moderate to severe AS with a harsh systolic murmur.  He has a high pCO2 of 91 and now on bipap therapy.  We are treating this aggressively in hopes of a rapid turn around.  He is DNR and we will honor his DNR order in hospital.  Continue management as noted.  Following labs closely.   Agree with documentation noted by Desiree Lucy MS4  Rodney Langton, MD  DACD FAAFP How to contact the [facility] Attending or Consulting provider 7A - 7P or covering provider during after hours 7P -7A, for this patient?  Check the care team in [facility] and look for a) attending/consulting [facility] provider listed and b) the [facility] team listed Log into [email or web address] and use [place] universal password to access. If you do not have the password, please contact the hospital operator. Locate the [facility] provider you are looking for under [facility]  and page to a number that you can be directly reached. If you still have difficulty reaching the provider, please page the [facility] (Director on Call) for the Hospitalists listed on amion for assistance.

## 2022-09-28 ENCOUNTER — Encounter (HOSPITAL_COMMUNITY): Payer: Self-pay | Admitting: Family Medicine

## 2022-09-28 ENCOUNTER — Ambulatory Visit: Payer: Medicare Other | Admitting: Cardiovascular Disease

## 2022-09-28 DIAGNOSIS — E782 Mixed hyperlipidemia: Secondary | ICD-10-CM

## 2022-09-28 DIAGNOSIS — G4733 Obstructive sleep apnea (adult) (pediatric): Secondary | ICD-10-CM

## 2022-09-28 DIAGNOSIS — Z515 Encounter for palliative care: Secondary | ICD-10-CM | POA: Diagnosis not present

## 2022-09-28 DIAGNOSIS — Z7189 Other specified counseling: Secondary | ICD-10-CM | POA: Diagnosis not present

## 2022-09-28 DIAGNOSIS — I10 Essential (primary) hypertension: Secondary | ICD-10-CM | POA: Diagnosis not present

## 2022-09-28 DIAGNOSIS — G2581 Restless legs syndrome: Secondary | ICD-10-CM | POA: Diagnosis not present

## 2022-09-28 DIAGNOSIS — I5031 Acute diastolic (congestive) heart failure: Secondary | ICD-10-CM | POA: Diagnosis not present

## 2022-09-28 DIAGNOSIS — N492 Inflammatory disorders of scrotum: Secondary | ICD-10-CM | POA: Diagnosis not present

## 2022-09-28 DIAGNOSIS — I872 Venous insufficiency (chronic) (peripheral): Secondary | ICD-10-CM | POA: Diagnosis not present

## 2022-09-28 DIAGNOSIS — N5089 Other specified disorders of the male genital organs: Secondary | ICD-10-CM

## 2022-09-28 LAB — BASIC METABOLIC PANEL
Anion gap: 10 (ref 5–15)
BUN: 30 mg/dL — ABNORMAL HIGH (ref 8–23)
CO2: 36 mmol/L — ABNORMAL HIGH (ref 22–32)
Calcium: 8.4 mg/dL — ABNORMAL LOW (ref 8.9–10.3)
Chloride: 91 mmol/L — ABNORMAL LOW (ref 98–111)
Creatinine, Ser: 1.37 mg/dL — ABNORMAL HIGH (ref 0.61–1.24)
GFR, Estimated: 51 mL/min — ABNORMAL LOW (ref >60)
Glucose, Bld: 126 mg/dL — ABNORMAL HIGH (ref 70–99)
Potassium: 4.4 mmol/L (ref 3.5–5.1)
Sodium: 137 mmol/L (ref 135–145)

## 2022-09-28 LAB — CBC WITH DIFFERENTIAL/PLATELET
Abs Immature Granulocytes: 0.05 10*3/uL (ref 0.00–0.07)
Basophils Absolute: 0 10*3/uL (ref 0.0–0.1)
Basophils Relative: 1 %
Eosinophils Absolute: 0 10*3/uL (ref 0.0–0.5)
Eosinophils Relative: 0 %
HCT: 49.1 % (ref 39.0–52.0)
Hemoglobin: 14.4 g/dL (ref 13.0–17.0)
Immature Granulocytes: 1 %
Lymphocytes Relative: 11 %
Lymphs Abs: 0.6 10*3/uL — ABNORMAL LOW (ref 0.7–4.0)
MCH: 30.9 pg (ref 26.0–34.0)
MCHC: 29.3 g/dL — ABNORMAL LOW (ref 30.0–36.0)
MCV: 105.4 fL — ABNORMAL HIGH (ref 80.0–100.0)
Monocytes Absolute: 0.3 10*3/uL (ref 0.1–1.0)
Monocytes Relative: 5 %
Neutro Abs: 5 10*3/uL (ref 1.7–7.7)
Neutrophils Relative %: 82 %
Platelets: 178 10*3/uL (ref 150–400)
RBC: 4.66 MIL/uL (ref 4.22–5.81)
RDW: 15.5 % (ref 11.5–15.5)
WBC: 6 10*3/uL (ref 4.0–10.5)
nRBC: 0 % (ref 0.0–0.2)

## 2022-09-28 LAB — BRAIN NATRIURETIC PEPTIDE: B Natriuretic Peptide: 53 pg/mL (ref 0.0–100.0)

## 2022-09-28 LAB — MAGNESIUM: Magnesium: 2.2 mg/dL (ref 1.7–2.4)

## 2022-09-28 MED ORDER — ENOXAPARIN SODIUM 80 MG/0.8ML IJ SOSY
65.0000 mg | PREFILLED_SYRINGE | INTRAMUSCULAR | Status: DC
  Administered 2022-09-28 – 2022-09-30 (×3): 65 mg via SUBCUTANEOUS
  Filled 2022-09-28 (×3): qty 0.8

## 2022-09-28 MED ORDER — IPRATROPIUM-ALBUTEROL 0.5-2.5 (3) MG/3ML IN SOLN
3.0000 mL | Freq: Two times a day (BID) | RESPIRATORY_TRACT | Status: DC
  Administered 2022-09-28 – 2022-10-04 (×12): 3 mL via RESPIRATORY_TRACT
  Filled 2022-09-28 (×12): qty 3

## 2022-09-28 MED ORDER — ALBUTEROL SULFATE (2.5 MG/3ML) 0.083% IN NEBU: 2.5000 mg | INHALATION_SOLUTION | RESPIRATORY_TRACT | Status: DC | PRN

## 2022-09-28 NOTE — Progress Notes (Signed)
Mobility Specialist Progress Note:    09/28/22 1244  Mobility  Activity Dangled on edge of bed;Moved into chair position in bed;Turned to back - supine  Level of Assistance Minimal assist, patient does 75% or more  Assistive Device None  Range of Motion/Exercises Passive;Right leg;Left leg  Activity Response Tolerated well  Mobility Referral Yes  $Mobility charge 1 Mobility  Mobility Specialist Start Time (ACUTE ONLY) 1210  Mobility Specialist Stop Time (ACUTE ONLY) 1220  Mobility Specialist Time Calculation (min) (ACUTE ONLY) 10 min   Pt received dangling EOB, requested assistance to return to supine position in bed. Tolerated well, asx throughout. Required MinA to lift legs back into bed. Left pt in bed, call bell in reach, all needs met.   Feliciana Rossetti Mobility Specialist Please contact via Special educational needs teacher or  Rehab office at [phone]

## 2022-09-28 NOTE — Consult Note (Signed)
Consultation Note Date: 09/28/2022   Patient Name: Vernon Campbell  DOB: Oct 23, 1940  MRN: [id number]  Age / Sex: 82 y.o., male  PCP: System, Provider Not In Referring Physician: Cleora Fleet, MD  Reason for Consultation: Establishing goals of care  HPI/Patient Profile: 82 y.o. male  with past medical history of HFpEF, COPD chronic respiratory failure with hypoxia with baseline 3 L, obesity hypoventilation syndrome, HTN/HLD, CAD, hypothyroid, obstructive sleep apnea, restless leg syndrome, chronic venous stasis dermatitis seen by urology on 7/3 diagnosed with penile cellulitis admitted on 09/27/2022 with acute heart failure with preserved EF exacerbation, acute on chronic hypoxic respiratory failure.   Clinical Assessment and Goals of Care: I have reviewed medical records including EPIC notes, labs and imaging, received report from RN, assessed the patient.  Vernon Campbell is lying quietly in bed.  He appears chronically ill and somewhat frail.  He greets me, making and mostly keeping eye contact.  He is alert and oriented, able to make his needs known.  There is no family at bedside.  Bedside nursing staff is present attending to needs preparing him for transfer out of the intensive care.  We meet at the bedside to discuss diagnosis prognosis, GOC, EOL wishes, disposition and options.  I introduced Palliative Medicine as specialized medical care for people living with serious illness. It focuses on providing relief from the symptoms and stress of a serious illness. The goal is to improve quality of life for both the patient and the family.  We discussed a brief life review of the patient.  Vernon Campbell tells me that he has lived at [facility] under long-term care for 2.5 years.  We then focused on their current illness.  We talk about his heart failure and the treatment plan.  He shares that he is on a no salt diet.  We  talked about the need for fluid restrictions.  I encouraged him to discuss this with his cardiologist.  We talk about his cellulitis and the treatment plan.  Vernon Campbell tells me that he would return to [facility] under long-term care when ready.  I offered to update his phone Vernon Campbell via phone but he declines.  Anticipate that he would be skillable. The natural disease trajectory and expectations at EOL were discussed.  Advanced directives, concepts specific to code status, artifical feeding and hydration, and rehospitalization were considered and discussed.  DNR verified.  Palliative Care services outpatient were explained and offered.  We talked about the benefits of outpatient palliative services for further goals of care discussions.  At this point he is agreeable for extra support.  Vernon Campbell has their own in-house palliative services.  Discussed the importance of continued conversation with family and the medical providers regarding overall plan of care and treatment options, ensuring decisions are within the context of the patient's values and GOCs.  Questions and concerns were addressed. The patient was encouraged to call with questions or concerns.  PMT will continue to support holistically.  Conference with attending, bedside nursing  staff, transition care team related to patient condition, needs, goals of care, disposition.    HCPOA  NEXT OF KIN -Vernon Campbell, Vernon Campbell.    SUMMARY OF RECOMMENDATIONS   At this point continue to treat the treatable but no CPR or intubation. Return to [facility] under long-term care when able Outpatient palliative services through [facility]   Code Status/Advance Care Planning: DNR  Symptom Management:  Per hospitalist, no additional needs at this time.  Palliative Prophylaxis:  Frequent Pain Assessment, Palliative Wound Care, and Turn Reposition  Additional Recommendations (Limitations, Scope, Preferences): Continue to treat but no CPR or  intubation  Psycho-social/Spiritual:  Desire for further Chaplaincy support:no Additional Recommendations: Caregiving  Support/Resources  Prognosis:  Unable to determine, based on outcomes.  6 months or less would not be surprising based on chronic illness burden, decreasing functional status, 2 hospital stays in the last 6 months, average life expectancy of 3 years in a skilled nursing facility.  Discharge Planning: Return to long-term care at [facility] where he has been for 2.5 years with in-house palliative care      Primary Diagnoses: Present on Admission:  Acute heart failure with preserved ejection fraction (HCC)  Restless leg syndrome  Chronic respiratory failure with hypoxia (HCC)  Coronary artery disease involving native coronary artery  Essential hypertension  Obesity hypoventilation syndrome (HCC)  Hyperlipidemia  Hypothyroidism  Chronic stasis dermatitis  COPD (chronic obstructive pulmonary disease) (HCC)  Esophageal reflux  MGUS (monoclonal gammopathy of unknown significance)  Mild pulmonary hypertension (HCC)  OSA (obstructive sleep apnea)  BPH without urinary obstruction  Cellulitis of scrotum  Scrotal edema   I have reviewed the medical record, interviewed the patient and family, and examined the patient. The following aspects are pertinent.  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)    Hypokalemia    Hypothyroidism, adult    Ileus (HCC)    Leg pain, diffuse    Lymphedema    Muscle weakness (generalized)    Obstructive sleep apnea syndrome    Osteoarthritis, unspecified osteoarthritis type, unspecified site    Other seborrheic keratosis    Paroxysmal atrial fibrillation (HCC)    Peripheral venous insufficiency    Pulmonary hypertension (HCC)    Recurrent major depression (HCC)    Restless leg syndrome    Vitamin D deficiency, unspecified    Vitreous degeneration of left eye    Social History   Socioeconomic History   Marital status: Single    Spouse name: Not on file   Number of children: Not on file   Years of education: Not on file   Highest education level: Not on file  Occupational History   Not on file  Tobacco Use   Smoking status: Former    Types: Cigarettes   Smokeless tobacco: Never  Vaping Use   Vaping Use: Never used  Substance and Sexual Activity   Alcohol use: Not Currently   Drug use: Not Currently   Sexual activity: Not Currently  Other Topics Concern   Not on file  Social History Narrative   Not on file   Social Determinants of Health   Financial Resource Strain: Not on file  Food Insecurity: No Food Insecurity (09/27/2022)   Hunger Vital Sign    Worried About Running Out of Food in the Last Year: Never true    Ran Out of Food in the Last Year: Never true  Transportation Needs: No Transportation Needs (09/27/2022)   PRAPARE - Administrator, Civil Service (Medical): No    Lack of Transportation (Non-Medical): No  Physical Activity: Not on file  Stress: Not on file  Social Connections: Not on file   History reviewed. No pertinent family history. Scheduled Meds:  aspirin EC  81 mg Oral Daily   atorvastatin  20 mg Oral QHS   doxycycline  100 mg Oral Q12H   enoxaparin (LOVENOX) injection  65 mg Subcutaneous Q24H   fluticasone furoate-vilanterol  1 puff Inhalation Daily   furosemide   80 mg Intravenous BID   ipratropium-albuterol  3 mL Nebulization BID   levothyroxine  175 mcg Oral QAC breakfast   methylPREDNISolone (SOLU-MEDROL) injection  40 mg Intravenous Q12H   pantoprazole  40 mg Oral QHS   polyethylene glycol  17 g Oral Daily   potassium chloride  20 mEq Oral BID   pramipexole  1 mg Oral QHS   senna-docusate  2 tablet Oral QHS   sodium chloride flush  3 mL Intravenous Q12H   spironolactone  50 mg Oral Daily   tamsulosin  0.4 mg Oral QPC supper   Continuous Infusions:  sodium chloride     PRN Meds:.sodium chloride, acetaminophen, albuterol, HYDROcodone-acetaminophen, lactulose, ondansetron (ZOFRAN) IV, sodium chloride flush Medications Prior to Admission:  Prior to Admission medications   Medication Sig Start Date End Date Taking? Authorizing Provider  albuterol (PROVENTIL) (2.5 MG/3ML) 0.083% nebulizer solution Take 3 mLs (2.5 mg total) by nebulization every 2 (two) hours as needed for shortness of breath. 12/15/21   Shon Hale, MD  atorvastatin (LIPITOR) 20 MG tablet Take 20 mg by mouth daily.    [provider]  fluticasone furoate-vilanterol (BREO ELLIPTA) 100-25 MCG/ACT AEPB Inhale 1 puff into the lungs daily. 12/16/21   Shon Hale, MD  HYDROcodone-acetaminophen (NORCO/VICODIN) 5-325 MG tablet Take 1 tablet by mouth every 8 (eight) hours as needed for moderate pain. 07/07/22   Sherryll Burger, Pratik D, DO  ketoconazole (NIZORAL) 2 % shampoo Apply 1 application  topically 2 (two) times a week. Apply to scalp (shampoo) topically every day shift every Tuesday and Friday for tinea versicolor (bath days)    [provider]  lactulose (CHRONULAC) 10 GM/15ML solution Take 15 mLs by mouth daily as needed for mild constipation.    [provider]  levothyroxine (SYNTHROID) 175 MCG tablet Take 175 mcg by mouth daily before breakfast.    [provider]  OXYGEN Inhale 2 L into the lungs continuous.    [provider]   pantoprazole (PROTONIX) 40 MG tablet Take 1 tablet (40 mg total) by mouth daily for 10 days. 07/07/22 07/17/22  Sherryll Burger, Pratik D, DO  polyethylene glycol (MIRALAX / GLYCOLAX) 17 g packet Take 17 g by mouth daily.    [provider]  potassium chloride SA (KLOR-CON) 20 MEQ tablet  Take 20 mEq by mouth daily.    [provider]  pramipexole (MIRAPEX) 1 MG tablet Take 1 tablet (1 mg total) by mouth at bedtime. 07/07/22   Sherryll Burger, Pratik D, DO  sennosides-docusate sodium (SENOKOT-S) 8.6-50 MG tablet Take 2 tablets by mouth at bedtime.    [provider]  spironolactone (ALDACTONE) 50 MG tablet Take 50 mg by mouth daily.    [provider]  sulfamethoxazole-trimethoprim (BACTRIM DS) 800-160 MG tablet Take 1 tablet by mouth every 12 (twelve) hours. 09/22/22   Donnita Falls, FNP  tamsulosin (FLOMAX) 0.4 MG CAPS capsule Take 1 capsule (0.4 mg total) by mouth daily after supper. 09/06/22   Donnita Falls, FNP  Torsemide 40 MG TABS Take 40 mg by mouth See admin instructions. Take 60 mg every morning and 40 mg every evening 12/15/21   Shon Hale, MD   No Known Allergies Review of Systems  Unable to perform ROS: Age    Physical Exam Vitals and nursing note reviewed.  Constitutional:      Appearance: He is obese.  Pulmonary:     Effort: Pulmonary effort is normal.  Neurological:     Mental Status: He is alert and oriented to person, place, and time.  Psychiatric:        Mood and Affect: Mood normal.        Behavior: Behavior normal.     Vital Signs: BP 93/63 (BP Location: Left Arm)   Pulse 71   Temp 97.9 F (36.6 C) (Oral)   Resp 18   Ht 5\' 9"  (1.753 m)   Wt 135.7 kg   SpO2 97%   BMI 44.18 kg/m  Pain Scale: 0-10   Pain Score: 4    SpO2: SpO2: 97 % O2 Device:SpO2: 97 % O2 Flow Rate: .O2 Flow Rate (L/min): 4 L/min  IO: Intake/output summary:  Intake/Output Summary (Last 24 hours) at 09/28/2022 1509 Last data filed at 09/28/2022 1110 Gross per 24  hour  Intake 240 ml  Output 3900 ml  Net -3660 ml    LBM: Last BM Date : 09/28/22 Baseline Weight: Weight: 117.9 kg Most recent weight: Weight: 135.7 kg     Palliative Assessment/Data:     Time In: 1030 Time Out: 1125 Time Total: 55 minutes  Greater than 50%  of this time was spent counseling and coordinating care related to the above assessment and plan.  Signed by: Katheran Awe, NP   Please contact Palliative Medicine Team phone at [phone] for questions and concerns.  For individual provider: See Loretha Stapler

## 2022-09-28 NOTE — Progress Notes (Signed)
PROGRESS NOTE    Vernon Campbell  ZOX:[id number] DOB: 1940-08-06 DOA: 09/27/2022 PCP: System, Provider Not In   Brief Narrative:  Vernon Campbell is a 82 y.o. male with medical history significant for HFpEF, COPD c/b chronic respiratory failure with hypoxia (on baseline 3L [place]), CAD, HTN, obesity-hypoventilation syndrome, HLD, hypothyroidism, OSA and restless leg syndrome who presented with increasing dyspnea and 10 pound weight gain over the last few days, and is admitted for treatment and management of his acute HFpEF exacerbation.    Vernon Campbell notes that he began having worsened SOB over the last 2-3 days. Recently, he went to the Urologist on 7/3 and was diagnosed with penile cellulitis, for which he was prescribed double strength bactrim BID x7 days.  On arrival, Cr at 1.46 (b/l 0.8-1.1) and BNP of 28. However 7/8 CXR demonstrated prominent bilateral interstitial opacities consistent with venous congestion. Given 80mg  IV Lasix. Presenting ABG with 7.3/91/84/44.8 and moved to Bipap. VBG improved on recheck with venous CO2 at 87, without acidosis. No in's charted from 7/8, but diuresed 4.2L with 1.4kg weight decrease.    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 (HCC) Active Problems:   Hyperlipidemia   Hypothyroidism   Chronic stasis dermatitis   Restless leg syndrome   Chronic respiratory failure with hypoxia (HCC)   COPD (chronic obstructive pulmonary disease) (HCC)   Coronary artery disease involving native coronary artery   Esophageal reflux   MGUS (monoclonal gammopathy of unknown significance)   Essential hypertension   Mild pulmonary hypertension (HCC)   Obesity hypoventilation syndrome (HCC)   OSA (obstructive sleep apnea)   BPH without urinary obstruction   Mod to Severe Aortic stenosis   Cellulitis of scrotum   Scrotal edema  Assessment and Plan: Acute HFpEF Exacerbation Cr at 1.46 (b/l 0.8-1.1). 7/8 CXR demonstrated  prominent bilateral interstitial opacities consistent with venous congestion. Given 80mg  IV Lasix. Presenting ABG with 7.3/91/84/44.8 and moved to Bipap. No in's charted on 7/8, but lost 1.4kg after 4.2L urinary output on IV 80mg  Lasix BID. Still with 3+ pitting edema into lower abdomen, appears to be a chronic problem c/b venous stasis dermatitis, despite 100mg  total Torsemide daily as outpatient. - Continue IV Lasix 80mg  BID - Continue Spironolactone 50mg  - Strict I/Os - Daily weights   Acute on Chronic Hypoxic and Hypercapnic Respiratory Failure Presenting ABG with 7.3/91/84/44.8 and moved to Bipap, without somnolence, though EMS reported pt had some visual hallucinations. 7/8 CXR demonstrated prominent bilateral interstitial opacities consistent with venous congestion. Will treat underlying insult with diuresis. Rechecked with VBG on 7/8 PM, with improvement in venous CO2 at 87. Wore Bipap at night and now breathing comfortably on 4L [place] (baseline 3L).  - Continue Bipap prn   COPD Exacerbation Some wheezing on exam and pt with increased O2 requirement. Though presenting with HFpEF symptoms and signs, cannot exclude overlapping COPD exacerbation, will empirically treat. - Duonebs q4 hours - IV Methylprednisolone 40mg  BID - Breo Ellipta daily - Doxycycline x 5 days ending 7/13, which will also cover cellulitis, as below.   AKI Cr at 1.46 on presentation (b/l 0.8-1.1) iso Bactrim (which does falsely elevate Creatinine) and pre-renal insult of acute HFpEF exacerbation and intravascular depletion. Cr has improved to 1.37, still above baseline.  - Need to collect UA - Will continue to monitor - Avoid nephrotoxic medications as able   Penile cellulitis Was treated with double strength Bactrim after 7/3 outpatient Urology visit. Would have  completed course by Wednesday 7/10, but in setting of AKI and likely COPD exacerbation, have switched to Doxycycline. - Doxycycline for 5 days, 7/9-7/13    Chronic venous stasis dermatitis Wears leg wraps (Una boots) which are changed twice a week.  - Will continue to monitor   Restless Leg Syndrome - Continue Norco-Vicodin 5-325mg  - Continue Pramipixole 1mg    CAD - Continue 81mg  ASA  - Continue Atorvastatin 20mg    Hypothyroidism - Continue Levothyroxine   BPH - Flomax 0.4mg    DVT prophylaxis: Lovenox Code Status: DNR  Family Communication:  Will ask pt Disposition Plan: back to [facility] Status is: Inpatient   Consultants:  None  Procedures:  None  Antimicrobials:  Doxycycline initiated 7/9, treat 5 days, end 7/13   Subjective: Patient seen and evaluated today with no new acute complaints or concerns. No acute concerns or events noted overnight. He notes that he is breathing much more comfortably and endorses a good appetite.   Objective: Vitals:   09/28/22 1000 09/28/22 1100 09/28/22 1123 09/28/22 1129  BP: (!) 105/57 (!) 96/47  93/63  Pulse: 83 72  71  Resp: 16 20  18   Temp:   98.1 F (36.7 C) 97.9 F (36.6 C)  TempSrc:   Oral Oral  SpO2: 98% 99%  97%  Weight:      Height:        Intake/Output Summary (Last 24 hours) at 09/28/2022 1139 Last data filed at 09/28/2022 1110 Gross per 24 hour  Intake 240 ml  Output 5000 ml  Net -4760 ml   Filed Weights   09/27/22 1026 09/27/22 1442 09/28/22 0259  Weight: 117.9 kg (!) 137.1 kg 135.7 kg    Examination:  General exam: Appears calm and comfortable, obese Respiratory system: On 4L [place]. Clear to auscultation, but diminished breath sounds. Respiratory effort normal. Cardiovascular system: S1 & S2 heard, RRR.  Gastrointestinal system: Abdominal wall soft tissue is edematous Central nervous system: Alert and awake Extremities: 3+ pitting edema Skin: No significant lesions noted Psychiatry: Responsive affect.    Data Reviewed: I have personally reviewed following labs and imaging studies  CBC: Recent Labs  Lab 09/27/22 1204  09/28/22 0459  WBC 8.6 6.0  NEUTROABS  --  5.0  HGB 14.8 14.4  HCT 49.4 49.1  MCV 104.9* 105.4*  PLT 177 178   Basic Metabolic Panel: Recent Labs  Lab 09/27/22 1204 09/28/22 0459  NA 136 137  K 4.6 4.4  CL 92* 91*  CO2 37* 36*  GLUCOSE 109* 126*  BUN 30* 30*  CREATININE 1.46* 1.37*  CALCIUM 8.7* 8.4*  MG 2.2 2.2   GFR: Estimated Creatinine Clearance: 54.9 mL/min (A) (by C-G formula based on SCr of 1.37 mg/dL (H)). Liver Function Tests: No results for input(s): "AST", "ALT", "ALKPHOS", "BILITOT", "PROT", "ALBUMIN" in the last 168 hours. No results for input(s): "LIPASE", "AMYLASE" in the last 168 hours. No results for input(s): "AMMONIA" in the last 168 hours. Coagulation Profile: No results for input(s): "INR", "PROTIME" in the last 168 hours. Cardiac Enzymes: No results for input(s): "CKTOTAL", "CKMB", "CKMBINDEX", "TROPONINI" in the last 168 hours. BNP (last 3 results) No results for input(s): "PROBNP" in the last 8760 hours. HbA1C: No results for input(s): "HGBA1C" in the last 72 hours. CBG: No results for input(s): "GLUCAP" in the last 168 hours. Lipid Profile: No results for input(s): "CHOL", "HDL", "LDLCALC", "TRIG", "CHOLHDL", "LDLDIRECT" in the last 72 hours. Thyroid Function Tests: No results for input(s): "TSH", "T4TOTAL", "  FREET4", "T3FREE", "THYROIDAB" in the last 72 hours. Anemia Panel: No results for input(s): "VITAMINB12", "FOLATE", "FERRITIN", "TIBC", "IRON", "RETICCTPCT" in the last 72 hours. Sepsis Labs: Recent Labs  Lab 09/27/22 1450  LATICACIDVEN 1.2    Recent Results (from the past 240 hour(s))  MRSA Next Gen by PCR, Nasal     Status: None   Collection Time: 09/27/22  3:20 PM   Specimen: Nasal Mucosa; Nasal Swab  Result Value Ref Range Status   MRSA by PCR Next Gen NOT DETECTED NOT DETECTED Final    Comment: (NOTE) The GeneXpert MRSA Assay (FDA approved for NASAL specimens only), is one component of a comprehensive MRSA colonization  surveillance program. It is not intended to diagnose MRSA infection nor to guide or monitor treatment for MRSA infections. Test performance is not FDA approved in patients less than 38 years old. Performed at [facility], [address], [place], [place] [address]          Radiology Studies: DG Chest Portable 1 View  Result Date: 09/27/2022 CLINICAL DATA:  sob EXAM: PORTABLE CHEST 1 VIEW COMPARISON:  CXR 07/05/22 FINDINGS: Cardiomegaly. Trace left pleural effusion. Unchanged hazy opacity at the left lung base could represent atelectasis or infection. Prominent bilateral interstitial opacities most likely represent pulmonary venous congestion. No radiographically apparent displaced rib fractures. Visualized upper abdomen is notable for colonic gaseous distention. IMPRESSION: 1. Cardiomegaly with pulmonary venous congestion. 2. Trace left pleural effusion. Electronically Signed   By: Lorenza Cambridge M.D.   On: 09/27/2022 11:41        Scheduled Meds:  aspirin EC  81 mg Oral Daily   atorvastatin  20 mg Oral QHS   doxycycline  100 mg Oral Q12H   enoxaparin (LOVENOX) injection  65 mg Subcutaneous Q24H   fluticasone furoate-vilanterol  1 puff Inhalation Daily   furosemide  80 mg Intravenous BID   ipratropium-albuterol  3 mL Nebulization BID   levothyroxine  175 mcg Oral QAC breakfast   methylPREDNISolone (SOLU-MEDROL) injection  40 mg Intravenous Q12H   pantoprazole  40 mg Oral QHS   polyethylene glycol  17 g Oral Daily   potassium chloride  20 mEq Oral BID   pramipexole  1 mg Oral QHS   senna-docusate  2 tablet Oral QHS   sodium chloride flush  3 mL Intravenous Q12H   spironolactone  50 mg Oral Daily   tamsulosin  0.4 mg Oral QPC supper   Continuous Infusions:  sodium chloride       LOS: 1 day    Time spent: 35 minutes  Signed,  Marcelline Mates, MS4 Working with Dr. Standley Dakins

## 2022-09-28 NOTE — TOC Initial Note (Signed)
Transition of Care [facility]) - Initial/Assessment Note    Patient Details  Name: Vernon Campbell MRN: [id number] Date of Birth: [birth year]  Transition of Care [facility]) CM/SW Contact:    Leitha Bleak, RN Phone Number: 09/28/2022, 2:06 PM  Clinical Narrative:     Patient admitted with acute heart failure. Patient has a high risk for readmission. Patient is a long term resident at [facility]. He has been there 2 1/2 years and plans to return. Palliative consulted. He will need to be followed by Palliative in house at [facility]. Discharge planning for 2 days.               Expected Discharge Plan: Long Term Nursing Home Barriers to Discharge: Continued Medical Work up  Patient Goals and CMS Choice Patient states their goals for this hospitalization and ongoing recovery are:: return to [email or web address] Compare Post Acute Care list provided to:: Patient Represenative (must comment)        Expected Discharge Plan and Services        Prior Living Arrangements/Services   Lives with:: Facility Resident      Activities of Daily Living Home Assistive Devices/Equipment: Eyeglasses, Oxygen, Other (Comment) (scooter) ADL Screening (condition at time of admission) Patient's cognitive ability adequate to safely complete daily activities?: Yes Is the patient deaf or have difficulty hearing?: No Does the patient have difficulty seeing, even when wearing glasses/contacts?: No Does the patient have difficulty concentrating, remembering, or making decisions?: No Patient able to express need for assistance with ADLs?: Yes Does the patient have difficulty dressing or bathing?: Yes Independently performs ADLs?: No Communication: Independent Dressing (OT): Needs assistance Is this a change from baseline?: Pre-admission baseline Grooming: Needs assistance Is this a change from baseline?: Pre-admission baseline Feeding: Independent Bathing: Needs assistance Is this a change from  baseline?: Pre-admission baseline Toileting: Needs assistance Is this a change from baseline?: Pre-admission baseline In/Out Bed: Needs assistance Is this a change from baseline?: Pre-admission baseline Walks in Home: Needs assistance Is this a change from baseline?: Pre-admission baseline Does the patient have difficulty walking or climbing stairs?: Yes Weakness of Legs: Both Weakness of Arms/Hands: None  Permission Sought/Granted       Emotional Assessment     Affect (typically observed): Accepting Orientation: : Oriented to Self, Oriented to Place, Oriented to  Time, Oriented to Situation Alcohol / Substance Use: Not Applicable Psych Involvement: No (comment)  Admission diagnosis:  SOB (shortness of breath) [R06.02] Acute on chronic congestive heart failure, unspecified heart failure type (HCC) [I50.9] Acute heart failure with preserved ejection fraction (HCC) [I50.31] Patient Active Problem List   Diagnosis Date Noted   Acute heart failure with preserved ejection fraction (HCC) 09/27/2022   Mod to Severe Aortic stenosis 09/27/2022   Cellulitis of scrotum 09/27/2022   Scrotal edema 09/27/2022   CHF (congestive heart failure) (HCC) 07/05/2022   COPD (chronic obstructive pulmonary disease) (HCC) 12/11/2021   Hypokalemia 08/20/2021   Restless leg syndrome 08/18/2021   Chronic respiratory failure with hypoxia (HCC) 08/18/2021   Hyperlipidemia 10/01/2020   Hypothyroidism 10/01/2020   Macrocytic anemia 10/01/2020   Obesity, Class III, BMI 40-49.9 (morbid obesity) (HCC) 10/01/2020   Chronic stasis dermatitis 10/01/2020   Right carpal tunnel syndrome 12/19/2018   Mild pulmonary hypertension (HCC) 04/15/2018   Debility 09/23/2017   Obesity hypoventilation syndrome (HCC) 03/06/2017   Chronic knee pain after total replacement of right knee joint 11/09/2016   Coronary artery disease involving native coronary artery 10/29/2015  MGUS (monoclonal gammopathy of unknown  significance) 08/15/2015   Osteoarthritis of right hip 01/19/2013   DJD (degenerative joint disease) of knee 09/18/2012   OSA (obstructive sleep apnea) 01/26/2012   Esophageal reflux 05/27/2009   Essential hypertension 08/08/2008   BPH without urinary obstruction 04/24/2007   PCP:  System, Provider Not In Pharmacy:   [facility] - [place], [place] - [address] [place] [address] Phone: [phone] Fax: [phone]   Social Determinants of Health (SDOH) Social History: SDOH Screenings   Food Insecurity: No Food Insecurity (09/27/2022)  Housing: Low Risk  (09/27/2022)  Transportation Needs: No Transportation Needs (09/27/2022)  Utilities: Not At Risk (09/27/2022)  Tobacco Use: Medium Risk (09/27/2022)   SDOH Interventions:     Readmission Risk Interventions    09/28/2022    2:05 PM 12/14/2021   12:46 PM  Readmission Risk Prevention Plan  Transportation Screening Complete Complete  PCP or Specialist Appt within 3-5 Days Complete   HRI or Home Care Consult Complete Complete  Social Work Consult for Recovery Care Planning/Counseling Complete Complete  Palliative Care Screening Not Applicable Not Applicable  Medication Review Oceanographer) Complete Complete

## 2022-09-28 NOTE — Progress Notes (Signed)
REDs clip performed X2. "Low Quality" reading both times.

## 2022-09-28 NOTE — Hospital Course (Addendum)
Vernon Campbell is a 82 y.o. male with medical history significant for HFpEF, COPD c/b chronic respiratory failure with hypoxia (on baseline 3L [place]), CAD, HTN, obesity-hypoventilation syndrome, HLD, hypothyroidism, OSA and restless leg syndrome who presented with increasing dyspnea and 10 pound weight gain over the last few days, and is admitted for treatment and management of his acute HFpEF exacerbation.    Mr Kumpf notes that he began having worsened SOB over the last 2-3 days. Recently, he went to the Urologist on 7/3 and was diagnosed with penile cellulitis, for which he was prescribed double strength bactrim BID x7 days.  On arrival, Cr at 1.46 (b/l 0.8-1.1) and BNP of 28. However 7/8 CXR demonstrated prominent bilateral interstitial opacities consistent with venous congestion. Given 80mg  IV Lasix. Presenting ABG with 7.3/91/84/44.8 and moved to Bipap. VBG improved on recheck with venous CO2 at 87, without acidosis. Is cumulative net negative more than 6.4L during this admission, due to leaking catheter 2/2 scrotal edema. 7/10 repeat CXR with continued cephalization of pulmonary vasculature, so added Metolazone on 7/10 and 7/11 with improved diuresis. Is on 4L [place] today with resolved crackles, baseline is 3L O2 via [place]. Cr stable at 1.23 on 7/12. Will continue Metolazone and Lasix, discontinue steroids day 5/5 today from COPD exacerbation.

## 2022-09-28 NOTE — Progress Notes (Signed)
Patient taken off bipap and placed back on nasal cannula at 0530 per patient request.   Currently 96% on 3L and tolerating well.

## 2022-09-28 NOTE — Evaluation (Signed)
Physical Therapy Evaluation Patient Details Name: Vernon Campbell MRN: [id number] DOB: 1940/03/26 Today's Date: 09/28/2022  History of Present Illness  Vernon Campbell is a 82 y.o. male with medical history significant for HFpEF, COPD c/b chronic respiratory failure with hypoxia (on baseline 3L [place]), CAD, HTN, obesity-hypoventilation syndrome, HLD, hypothyroidism, OSA and restless leg syndrome who presents with increasing dyspnea and 10 pound weight gain over the last few days, and is admitted for treatment and management of his acute HFpEF exacerbation.      Mr Weismiller notes that he began having worsened SOB over the last 2-3 days. He also notes that he isn't weighed every day, but has gained at least ten pounds in the last 5 days. He denies chest pain. His O2 requirement is increased from baseline 3L to 4-5L currently. While being transported to [facility] ED via EMS, he reportedly had a few hallucinations, not consistent with his baseline, but adds that he wasn't able to sleep last night. Typically, he takes Torsemide 60mg  qAM and 40mg  at bedtime, and wears Una boots. He has chronic venous stasis dermatitis and notes he also has restless legs.   Clinical Impression  Pt admitted with above diagnosis. Patient supine in bed upon therapist arrival. Patient agreeable to participating in evaluation today. After subjective portion of eval complete, patient reported he needed the bedpan. Nursing notified. Per patient and staff (3rd floor and ICU) report: patient requires min assist and use of bedrails for bed mobility; patient requires min assist for his lower extremities with sit to supine bed mobility; patient requires an elevated bed surface for sit to stand transfers and upon standing reaches for the armrest of the chair to pivot to sitting in the chair with min guard assistance. Patient reports he does not walk and is functioning at the level he has been for the past 2 1/2 years while at [facility] and this is where he  would like to return. Pt currently with functional limitations due to the deficits listed below (see PT Problem List). Pt will benefit from acute skilled PT to increase their independence and safety with mobility to allow discharge.           Assistance Recommended at Discharge Intermittent Supervision/Assistance  If plan is discharge home, recommend the following:  Can travel by private vehicle  Assist for transportation;Assistance with cooking/housework;A little help with walking and/or transfers;A little help with bathing/dressing/bathroom        Equipment Recommendations None recommended by PT  Recommendations for Other Services       Functional Status Assessment Patient has not had a recent decline in their functional status     Precautions / Restrictions Precautions Precautions: Fall Precaution Comments: patient reports at least 2 falls in the last six months Restrictions Weight Bearing Restrictions: No      Mobility  Bed Mobility Overal bed mobility: Needs Assistance Bed Mobility: Rolling, Sit to Supine Rolling: Min assist     Sit to supine: Min assist   General bed mobility comments: reported min assist and bedrails use for rolling and min assist for lower extremities with sit to supine    Transfers Overall transfer level: Needs assistance Equipment used: None Transfers: Sit to/from Stand, Bed to chair/wheelchair/BSC Sit to Stand: From elevated surface, Min guard Stand pivot transfers: Min assist, Min guard         General transfer comment: patient reaches for armrest of chair upon standing to steady himself for pivot transfers    Ambulation/Gait  General Gait Details: patient wheelchair bound and has been reported to be for 2 1/2 years.  Stairs      Wheelchair Mobility     Tilt Bed    Modified Rankin (Stroke Patients Only)       Balance Overall balance assessment: History of Falls, Needs assistance Sitting-balance support:  Bilateral upper extremity supported, Feet supported Sitting balance-Leahy Scale: Good Sitting balance - Comments: seated at EOB   Standing balance support: Bilateral upper extremity supported, During functional activity, Reliant on assistive device for balance Standing balance-Leahy Scale: Poor         Pertinent Vitals/Pain Pain Assessment Pain Assessment: PAINAD Breathing: normal Negative Vocalization: none Facial Expression: smiling or inexpressive Body Language: relaxed Consolability: no need to console PAINAD Score: 0    Home Living Family/patient expects to be discharged to:: Skilled nursing facility       Prior Function Prior Level of Function : History of Falls (last six months);Needs assist       Physical Assist : Mobility (physical);ADLs (physical) Mobility (physical): Transfers;Gait;Stairs ADLs (physical): IADLs Mobility Comments: reports wheelchair bound performed stand pivot transfers to/from recliner, scooter, commode and bed with assistance from staff       Hand Dominance        Extremity/Trunk Assessment   Upper Extremity Assessment Upper Extremity Assessment: Generalized weakness    Lower Extremity Assessment Lower Extremity Assessment: Generalized weakness    Cervical / Trunk Assessment Cervical / Trunk Assessment: Kyphotic  Communication   Communication: No difficulties  Cognition Arousal/Alertness: Awake/alert Behavior During Therapy: WFL for tasks assessed/performed Overall Cognitive Status: Within Functional Limits for tasks assessed        General Comments      Exercises     Assessment/Plan    PT Assessment Patient needs continued PT services  PT Problem List Decreased strength;Decreased activity tolerance;Decreased mobility;Decreased balance       PT Treatment Interventions DME instruction;Functional mobility training;Balance training;Patient/family education;Therapeutic activities;Therapeutic exercise    PT Goals  (Current goals can be found in the Care Plan section)  Acute Rehab PT Goals Patient Stated Goal: Go back to [facility]. PT Goal Formulation: With patient Time For Goal Achievement: 10/12/22 Potential to Achieve Goals: Fair    Frequency Min 2X/week        AM-PAC PT "6 Clicks" Mobility  Outcome Measure Help needed turning from your back to your side while in a flat bed without using bedrails?: A Little Help needed moving from lying on your back to sitting on the side of a flat bed without using bedrails?: A Little Help needed moving to and from a bed to a chair (including a wheelchair)?: A Little Help needed standing up from a chair using your arms (e.g., wheelchair or bedside chair)?: A Little Help needed to walk in hospital room?: Total Help needed climbing 3-5 steps with a railing? : Total 6 Click Score: 14    End of Session Equipment Utilized During Treatment: Oxygen Activity Tolerance: Patient tolerated treatment well;Patient limited by fatigue Patient left: in bed;with call bell/phone within reach;with bed alarm set;with family/visitor present Nurse Communication: Mobility status PT Visit Diagnosis: Other abnormalities of gait and mobility (R26.89);History of falling (Z91.81);Unsteadiness on feet (R26.81);Muscle weakness (generalized) (M62.81)    Time: 1610-9604 PT Time Calculation (min) (ACUTE ONLY): 24 min   Charges:   PT Evaluation $PT Eval Low Complexity: 1 Low   PT General Charges $$ ACUTE PT VISIT: 1 Visit        Katina Dung.  Hartnett-Rands, MS, PT [facility] [place] [phone]  Britta Mccreedy  Hartnett-Rands 09/28/2022, 2:49 PM

## 2022-09-28 NOTE — Progress Notes (Signed)
Patient placed on bipap at 0115.  Tolerating well at this time.

## 2022-09-28 NOTE — Plan of Care (Signed)
  Problem: Acute Rehab PT Goals(only PT should resolve) Goal: Pt will Roll Supine to Side Outcome: Progressing Flowsheets (Taken 09/28/2022 1456) Pt will Roll Supine to Side:  min guard  with rail Goal: Pt Will Go Supine/Side To Sit Outcome: Progressing Flowsheets (Taken 09/28/2022 1456) Pt will go Supine/Side to Sit:  with minimal assist  with HOB elevated Goal: Pt Will Go Sit To Supine/Side Outcome: Progressing Flowsheets (Taken 09/28/2022 1456) Pt will go Sit to Supine/Side:  with HOB  elevated  with min guard assist Goal: Patient Will Transfer Sit To/From Stand Outcome: Progressing Flowsheets (Taken 09/28/2022 1456) Patient will transfer sit to/from stand:  with min guard assist  from elevated surface Goal: Pt Will Transfer Bed To Chair/Chair To Bed Outcome: Progressing Flowsheets (Taken 09/28/2022 1456) Pt will Transfer Bed to Chair/Chair to Bed:  with min assist  min guard assist Goal: Pt/caregiver will Perform Home Exercise Program Outcome: Progressing Flowsheets (Taken 09/28/2022 1456) Pt/caregiver will Perform Home Exercise Program:  For increased strengthening  For increased ROM  With Supervision, verbal cues required/provided    Katina Dung. Hartnett-Rands, MS, PT [facility] [phone] 09/28/2022

## 2022-09-28 NOTE — Progress Notes (Signed)
Patient placed on Bipap; 14/8, flow rate at 5L.  Patient sat at 94%.

## 2022-09-28 NOTE — Progress Notes (Signed)
Per RN patient requested to be placed on BIPAP at 0100. Since machine was on standby mode RN placed patient on BIPAP with previous settings. Patient then came off at 0300 and then asked to be placed back on at 0400. RT was not made aware. Patient still on BIPAP at this time, doing well. Breathing treatment given through machine via HHN.

## 2022-09-29 ENCOUNTER — Ambulatory Visit: Payer: Medicare Other | Admitting: Urology

## 2022-09-29 ENCOUNTER — Inpatient Hospital Stay (HOSPITAL_COMMUNITY): Payer: Medicare Other

## 2022-09-29 DIAGNOSIS — N4889 Other specified disorders of penis: Secondary | ICD-10-CM

## 2022-09-29 DIAGNOSIS — N4822 Cellulitis of corpus cavernosum and penis: Secondary | ICD-10-CM

## 2022-09-29 DIAGNOSIS — Z87891 Personal history of nicotine dependence: Secondary | ICD-10-CM

## 2022-09-29 DIAGNOSIS — N5089 Other specified disorders of the male genital organs: Secondary | ICD-10-CM

## 2022-09-29 LAB — CBC WITH DIFFERENTIAL/PLATELET
Abs Immature Granulocytes: 0.06 10*3/uL (ref 0.00–0.07)
Basophils Absolute: 0 10*3/uL (ref 0.0–0.1)
Basophils Relative: 0 %
Eosinophils Absolute: 0 10*3/uL (ref 0.0–0.5)
Eosinophils Relative: 0 %
HCT: 48.5 % (ref 39.0–52.0)
Hemoglobin: 14.5 g/dL (ref 13.0–17.0)
Immature Granulocytes: 1 %
Lymphocytes Relative: 12 %
Lymphs Abs: 1.1 10*3/uL (ref 0.7–4.0)
MCH: 31.2 pg (ref 26.0–34.0)
MCHC: 29.9 g/dL — ABNORMAL LOW (ref 30.0–36.0)
MCV: 104.3 fL — ABNORMAL HIGH (ref 80.0–100.0)
Monocytes Absolute: 0.7 10*3/uL (ref 0.1–1.0)
Monocytes Relative: 7 %
Neutro Abs: 7.2 10*3/uL (ref 1.7–7.7)
Neutrophils Relative %: 80 %
Platelets: 194 10*3/uL (ref 150–400)
RBC: 4.65 MIL/uL (ref 4.22–5.81)
RDW: 15.6 % — ABNORMAL HIGH (ref 11.5–15.5)
WBC: 9 10*3/uL (ref 4.0–10.5)
nRBC: 0 % (ref 0.0–0.2)

## 2022-09-29 LAB — URINALYSIS, ROUTINE W REFLEX MICROSCOPIC
Bilirubin Urine: NEGATIVE
Glucose, UA: NEGATIVE mg/dL
Hgb urine dipstick: NEGATIVE
Ketones, ur: NEGATIVE mg/dL
Leukocytes,Ua: NEGATIVE
Nitrite: NEGATIVE
Protein, ur: NEGATIVE mg/dL
Specific Gravity, Urine: 1.015 (ref 1.005–1.030)
pH: 5 (ref 5.0–8.0)

## 2022-09-29 LAB — BASIC METABOLIC PANEL
Anion gap: 8 (ref 5–15)
BUN: 39 mg/dL — ABNORMAL HIGH (ref 8–23)
CO2: 37 mmol/L — ABNORMAL HIGH (ref 22–32)
Calcium: 8.9 mg/dL (ref 8.9–10.3)
Chloride: 90 mmol/L — ABNORMAL LOW (ref 98–111)
Creatinine, Ser: 1.54 mg/dL — ABNORMAL HIGH (ref 0.61–1.24)
GFR, Estimated: 44 mL/min — ABNORMAL LOW (ref >60)
Glucose, Bld: 139 mg/dL — ABNORMAL HIGH (ref 70–99)
Potassium: 4.4 mmol/L (ref 3.5–5.1)
Sodium: 135 mmol/L (ref 135–145)

## 2022-09-29 LAB — MAGNESIUM: Magnesium: 2.2 mg/dL (ref 1.7–2.4)

## 2022-09-29 MED ORDER — METOLAZONE 5 MG PO TABS
2.5000 mg | ORAL_TABLET | Freq: Once | ORAL | Status: AC
  Administered 2022-09-29: 2.5 mg via ORAL
  Filled 2022-09-29: qty 1

## 2022-09-29 NOTE — Progress Notes (Signed)
Patient declined BIPAP tonight. Unit still at bedside if needed. Told patient to call if he changed his mind and wanted to wear.

## 2022-09-29 NOTE — Progress Notes (Signed)
PROGRESS NOTE    Vernon Campbell  WUJ:[id number] DOB: 10/01/1940 DOA: 09/27/2022 PCP: System, Provider Not In   Brief Narrative:  Vernon Campbell is a 82 y.o. male with medical history significant for HFpEF, COPD c/b chronic respiratory failure with hypoxia (on baseline 3L [place]), CAD, HTN, obesity-hypoventilation syndrome, HLD, hypothyroidism, OSA and restless leg syndrome who presented with increasing dyspnea and 10 pound weight gain over the last few days, and is admitted for treatment and management of his acute HFpEF exacerbation.    Mr Howington notes that he began having worsened SOB over the last 2-3 days. Recently, he went to the Urologist on 7/3 and was diagnosed with penile cellulitis, for which he was prescribed double strength bactrim BID x7 days.  On arrival, Cr at 1.46 (b/l 0.8-1.1) and BNP of 28. However 7/8 CXR demonstrated prominent bilateral interstitial opacities consistent with venous congestion. Given 80mg  IV Lasix. Presenting ABG with 7.3/91/84/44.8 and moved to Bipap. VBG improved on recheck with venous CO2 at 87, without acidosis. Is cumulative net negative 4.8L during this admission. Is on 4L [place] today with crackles, baseline is 3L O2 via [place]. Cr up to 1.54 and repeat CXR with continued cephalization of pulmonary vasculature.    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 (HCC) Active Problems:   Hyperlipidemia   Hypothyroidism   Chronic stasis dermatitis   Restless leg syndrome   Chronic respiratory failure with hypoxia (HCC)   COPD (chronic obstructive pulmonary disease) (HCC)   Coronary artery disease involving native coronary artery   Esophageal reflux   MGUS (monoclonal gammopathy of unknown significance)   Essential hypertension   Mild pulmonary hypertension (HCC)   Obesity hypoventilation syndrome (HCC)   OSA (obstructive sleep apnea)   BPH without urinary obstruction   Mod to Severe Aortic stenosis   Cellulitis of  scrotum   Scrotal edema  Assessment and Plan: Acute HFpEF Exacerbation Cr mildly increased from 1.37 yesterday to 1.54 today (b/l 0.8-1.1). 7/8 CXR demonstrated prominent bilateral interstitial opacities consistent with venous congestion, and repeat on 7/10 with continued cephalization of pulm vasculature. Is net negative 4.8L cumulative this admission on 80mg  IV Lasix BID (more urine output not captured 2/2 spill). Still with 3+ pitting edema into lower abdomen, appears to be a chronic problem c/b venous stasis dermatitis, despite 100mg  total Torsemide daily as outpatient. On 4L (3L baseline) [place]. With rising Cr and pt close to baseline, will add last dose of 2.5mg  Metolazone today, and plan to switch to home dose Torsemide tomorrow. - Last day of IV Lasix 80mg  BID today - Spot dose Metolazone 2.5mg  today - Continue Spironolactone 50mg  - Strict I/Os - Daily weights   Acute on Chronic Hypoxic and Hypercapnic Respiratory Failure Presenting ABG with 7.3/91/84/44.8 and moved to Bipap, without somnolence, though EMS reported pt had some visual hallucinations. 7/8 CXR demonstrated prominent bilateral interstitial opacities consistent with venous congestion. Treating underlying insult with diuresis. Rechecked with VBG on 7/8 PM, with improvement in venous CO2 at 87. Continues Bipap at night and now breathing comfortably on 4L [place] (baseline 3L).  - Continue Bipap at night - Wean O2 [place] to baseline 3L as tolerated   COPD Exacerbation Some wheezing on exam and pt with increased O2 requirement. Though presenting with HFpEF symptoms and signs, cannot exclude overlapping COPD exacerbation, have chosen to empirically treat. - Duonebs q4 hours - IV Methylprednisolone 40mg  BID - Breo Ellipta daily - Doxycycline x 5 days  ending 7/13, which will also cover cellulitis, as below.   AKI Cr at 1.46 on presentation (b/l 0.8-1.1) iso Bactrim (which does falsely elevate Creatinine) and pre-renal insult of acute HFpEF  exacerbation and intravascular depletion. Cr stable to mildly worsened at 1.54 in the setting of tenuous diuresis. 7/10 UA is unremarkable. - Will continue to monitor - Avoid nephrotoxic medications as able   Penile cellulitis Was treated with double strength Bactrim after 7/3 outpatient Urology visit. Would have completed course by Wednesday 7/10, but in setting of AKI and likely COPD exacerbation, have switched to Doxycycline. Clinically improving. - Doxycycline for 5 days, 7/9-7/13   Chronic venous stasis dermatitis Wears leg wraps (Una boots) which are changed twice a week.  - Wrapping legs and elevating scrotum today - Will continue to monitor   Restless Leg Syndrome - Continue Norco-Vicodin 5-325mg  - Continue Pramipixole 1mg    CAD - Continue 81mg  ASA  - Continue Atorvastatin 20mg    Hypothyroidism - Continue Levothyroxine   BPH - Flomax 0.4mg    DVT prophylaxis: Lovenox Code Status: DNR  Family Communication:  Will ask pt Disposition Plan: back to [facility] Status is: Inpatient   Consultants:  None  Procedures:  None  Antimicrobials:  Doxycycline initiated 7/9, treat 5 days, end 7/13   Subjective: Patient seen and evaluated today with no new acute complaints or concerns. No acute concerns or events noted overnight. He notes that he is breathing much more comfortably and endorses a good appetite. Denies any scrotal or groin pain.  Objective: Vitals:   09/28/22 2315 09/29/22 0420 09/29/22 0500 09/29/22 0659  BP:  125/75    Pulse: 74 74    Resp: (!) 23 16    Temp:  97.6 F (36.4 C)    TempSrc:  Oral    SpO2: 94% 96%  96%  Weight:   (!) 136.6 kg   Height:        Intake/Output Summary (Last 24 hours) at 09/29/2022 1147 Last data filed at 09/29/2022 0915 Gross per 24 hour  Intake 1083 ml  Output 1200 ml  Net -117 ml   Filed Weights   09/27/22 1442 09/28/22 0259 09/29/22 0500  Weight: (!) 137.1 kg 135.7 kg (!) 136.6 kg     Examination:  General exam: Appears calm and comfortable, obese Respiratory system: On 4L [place]. Crackles in bilateral bases. Respiratory effort normal. Cardiovascular system: S1 & S2 heard, RRR.  Gastrointestinal system: Abdominal wall soft tissue is less edematous Central nervous system: Alert and awake Extremities: 3+ pitting edema, chronic venous stasis changes Skin: No significant lesions noted Psychiatry: Responsive affect.    Data Reviewed: I have personally reviewed following labs and imaging studies  CBC: Recent Labs  Lab 09/27/22 1204 09/28/22 0459 09/29/22 0458  WBC 8.6 6.0 9.0  NEUTROABS  --  5.0 7.2  HGB 14.8 14.4 14.5  HCT 49.4 49.1 48.5  MCV 104.9* 105.4* 104.3*  PLT 177 178 194   Basic Metabolic Panel: Recent Labs  Lab 09/27/22 1204 09/28/22 0459 09/29/22 0458  NA 136 137 135  K 4.6 4.4 4.4  CL 92* 91* 90*  CO2 37* 36* 37*  GLUCOSE 109* 126* 139*  BUN 30* 30* 39*  CREATININE 1.46* 1.37* 1.54*  CALCIUM 8.7* 8.4* 8.9  MG 2.2 2.2 2.2   GFR: Estimated Creatinine Clearance: 49 mL/min (A) (by C-G formula based on SCr of 1.54 mg/dL (H)). Liver Function Tests: No results for input(s): "AST", "ALT", "ALKPHOS", "BILITOT", "PROT", "ALBUMIN" in  the last 168 hours. No results for input(s): "LIPASE", "AMYLASE" in the last 168 hours. No results for input(s): "AMMONIA" in the last 168 hours. Coagulation Profile: No results for input(s): "INR", "PROTIME" in the last 168 hours. Cardiac Enzymes: No results for input(s): "CKTOTAL", "CKMB", "CKMBINDEX", "TROPONINI" in the last 168 hours. BNP (last 3 results) No results for input(s): "PROBNP" in the last 8760 hours. HbA1C: No results for input(s): "HGBA1C" in the last 72 hours. CBG: No results for input(s): "GLUCAP" in the last 168 hours. Lipid Profile: No results for input(s): "CHOL", "HDL", "LDLCALC", "TRIG", "CHOLHDL", "LDLDIRECT" in the last 72 hours. Thyroid Function Tests: No results for input(s):  "TSH", "T4TOTAL", "FREET4", "T3FREE", "THYROIDAB" in the last 72 hours. Anemia Panel: No results for input(s): "VITAMINB12", "FOLATE", "FERRITIN", "TIBC", "IRON", "RETICCTPCT" in the last 72 hours. Sepsis Labs: Recent Labs  Lab 09/27/22 1450  LATICACIDVEN 1.2    Recent Results (from the past 240 hour(s))  MRSA Next Gen by PCR, Nasal     Status: None   Collection Time: 09/27/22  3:20 PM   Specimen: Nasal Mucosa; Nasal Swab  Result Value Ref Range Status   MRSA by PCR Next Gen NOT DETECTED NOT DETECTED Final    Comment: (NOTE) The GeneXpert MRSA Assay (FDA approved for NASAL specimens only), is one component of a comprehensive MRSA colonization surveillance program. It is not intended to diagnose MRSA infection nor to guide or monitor treatment for MRSA infections. Test performance is not FDA approved in patients less than 71 years old. Performed at [facility], [address] [place], [place] [address]          Radiology Studies: [facility] Chest [place] 1 View  Result Date: 09/29/2022 CLINICAL DATA:  82 year old male with history of heart failure with preserved ejection fraction. History of COPD. Hypertension. EXAM: PORTABLE CHEST 1 VIEW COMPARISON:  Chest x-ray 09/27/2022. FINDINGS: Elevation of the left hemidiaphragm unchanged. Opacity at the left base increasing compared to the prior study, which may reflect evolving atelectasis and/or consolidation, likely with superimposed small left pleural effusion. No pneumothorax. There is cephalization of the pulmonary vasculature and slight indistinctness of the interstitial markings suggestive of mild pulmonary edema. Mild cardiomegaly. The patient is rotated to the right on today's exam, resulting in distortion of the mediastinal contours and reduced diagnostic sensitivity and specificity for mediastinal pathology. IMPRESSION: 1. The appearance of the chest suggests mild congestive heart failure, as above. 2. Worsening aeration at the left lung  base which may reflect increasing areas of atelectasis and/or consolidation, along with superimposed small left pleural effusion. 3. Elevation of the left hemidiaphragm again noted. Electronically Signed   By: Trudie Reed M.D.   On: 09/29/2022 05:45        Scheduled Meds:  aspirin EC  81 mg Oral Daily   atorvastatin  20 mg Oral QHS   doxycycline  100 mg Oral Q12H   enoxaparin (LOVENOX) injection  65 mg Subcutaneous Q24H   fluticasone furoate-vilanterol  1 puff Inhalation Daily   furosemide  80 mg Intravenous BID   ipratropium-albuterol  3 mL Nebulization BID   levothyroxine  175 mcg Oral QAC breakfast   methylPREDNISolone (SOLU-MEDROL) injection  40 mg Intravenous Q12H   metolazone  2.5 mg Oral Once   pantoprazole  40 mg Oral QHS   polyethylene glycol  17 g Oral Daily   potassium chloride  20 mEq Oral BID   pramipexole  1 mg Oral QHS   senna-docusate  2 tablet Oral  QHS   sodium chloride flush  3 mL Intravenous Q12H   spironolactone  50 mg Oral Daily   tamsulosin  0.4 mg Oral QPC supper   Continuous Infusions:  sodium chloride       LOS: 2 days    Time spent: 35 minutes  Signed,  Marcelline Mates, MS4 Working with Dr. Maurilio Lovely

## 2022-09-29 NOTE — Progress Notes (Signed)
Alert with periods of confusion, thinking he I not in right room.  Has male wick on for urine collection but not collecting all urine due to unable to achieve secure fit due to scrotal edema, etc.  Elevated scrotum on rolled up sheet.  Swat nurse going to get unaboot material from ED and wrap legs.

## 2022-09-29 NOTE — Progress Notes (Signed)
Scant amount of blood seen around nares under [place], patient said it happens from time to time. Nasal cannula removed to clean nares, no active bleed found. No pain reported.

## 2022-09-29 NOTE — Progress Notes (Signed)
Patient requested to be taken off the Bipap machine and replace nasal cannula with 5LO2. Patient also requested to sit in chair to sleep better.

## 2022-09-30 DIAGNOSIS — Z515 Encounter for palliative care: Secondary | ICD-10-CM | POA: Diagnosis not present

## 2022-09-30 DIAGNOSIS — E662 Morbid (severe) obesity with alveolar hypoventilation: Secondary | ICD-10-CM | POA: Diagnosis not present

## 2022-09-30 DIAGNOSIS — Z7189 Other specified counseling: Secondary | ICD-10-CM | POA: Diagnosis not present

## 2022-09-30 DIAGNOSIS — I5031 Acute diastolic (congestive) heart failure: Secondary | ICD-10-CM | POA: Diagnosis not present

## 2022-09-30 LAB — BASIC METABOLIC PANEL
Anion gap: 10 (ref 5–15)
BUN: 48 mg/dL — ABNORMAL HIGH (ref 8–23)
CO2: 38 mmol/L — ABNORMAL HIGH (ref 22–32)
Calcium: 9 mg/dL (ref 8.9–10.3)
Chloride: 87 mmol/L — ABNORMAL LOW (ref 98–111)
Creatinine, Ser: 1.25 mg/dL — ABNORMAL HIGH (ref 0.61–1.24)
GFR, Estimated: 57 mL/min — ABNORMAL LOW (ref >60)
Glucose, Bld: 170 mg/dL — ABNORMAL HIGH (ref 70–99)
Potassium: 4 mmol/L (ref 3.5–5.1)
Sodium: 135 mmol/L (ref 135–145)

## 2022-09-30 LAB — MAGNESIUM: Magnesium: 2.1 mg/dL (ref 1.7–2.4)

## 2022-09-30 LAB — CBC WITH DIFFERENTIAL/PLATELET
Abs Immature Granulocytes: 0.06 10*3/uL (ref 0.00–0.07)
Basophils Absolute: 0 10*3/uL (ref 0.0–0.1)
Basophils Relative: 0 %
Eosinophils Absolute: 0 10*3/uL (ref 0.0–0.5)
Eosinophils Relative: 0 %
HCT: 51.9 % (ref 39.0–52.0)
Hemoglobin: 15.9 g/dL (ref 13.0–17.0)
Immature Granulocytes: 1 %
Lymphocytes Relative: 10 %
Lymphs Abs: 0.9 10*3/uL (ref 0.7–4.0)
MCH: 31.5 pg (ref 26.0–34.0)
MCHC: 30.6 g/dL (ref 30.0–36.0)
MCV: 103 fL — ABNORMAL HIGH (ref 80.0–100.0)
Monocytes Absolute: 0.7 10*3/uL (ref 0.1–1.0)
Monocytes Relative: 8 %
Neutro Abs: 7.3 10*3/uL (ref 1.7–7.7)
Neutrophils Relative %: 81 %
Platelets: 203 10*3/uL (ref 150–400)
RBC: 5.04 MIL/uL (ref 4.22–5.81)
RDW: 15.4 % (ref 11.5–15.5)
WBC: 8.9 10*3/uL (ref 4.0–10.5)
nRBC: 0 % (ref 0.0–0.2)

## 2022-09-30 MED ORDER — METOLAZONE 5 MG PO TABS
2.5000 mg | ORAL_TABLET | Freq: Once | ORAL | Status: AC
  Administered 2022-09-30: 2.5 mg via ORAL
  Filled 2022-09-30: qty 1

## 2022-09-30 NOTE — Progress Notes (Signed)
Palliative: Mr. Llorente is resting quietly in bed.  He appears chronically ill and somewhat frail.  He greets me, making but not really keeping eye contact.  He is alert and oriented, able to make his needs known.  There is no family at bedside at this time.  We talk about his acute health concerns including, but not limited to, heart failure, fluid overload, fluid restrictions, obesity.  Mr. Mattie tells me that he has seen 2 physicians so far this morning and they also spoke with him about fluid restrictions.  We talk about his large abdomen.  He tells me that he has been restricting his intake with a goal of losing some weight.  We talked about the difficulty in weight loss but the benefits of easier breathing and increased functional status.  We talk about returning to [facility] under [facility]-term care where he has been for the last 2.5 years.  He continues to be agreeable to outpatient palliative services.  Conference with attending, cardiology, bedside nursing staff, transition of care team related to patient condition, needs, goals of care, disposition.  Plan:   At this point continue to treat the treatable but no CPR or intubation.  Return to [facility] where he has been for the last 2.5 years under long-term care.  Outpatient palliative services to follow. DNR/goldenrod form on chart.  35 minutes  Lillia Carmel, NP Palliative medicine team Team phone [phone] Greater than 50% of this time was spent counseling and coordinating care related to the above assessment and plan.

## 2022-09-30 NOTE — Progress Notes (Signed)
Patient sat up in chair all shift he stated that it's too uncomfortable to lay in bed. Male external catheter replace X2 for leaking. Assisted to bedside commode X1 and patient had a bowel movement. Complaints of pain in legs and patient medicated X1 for pain.

## 2022-09-30 NOTE — Progress Notes (Signed)
PROGRESS NOTE    Vernon Campbell  ZOX:[id number] DOB: Feb 16, 1941 DOA: 09/27/2022 PCP: System, Provider Not In   Brief Narrative:  Vernon Campbell is a 82 y.o. male with medical history significant for HFpEF, COPD c/b chronic respiratory failure with hypoxia (on baseline 3L [place]), CAD, HTN, obesity-hypoventilation syndrome, HLD, hypothyroidism, OSA and restless leg syndrome who presented with increasing dyspnea and 10 pound weight gain over the last few days, and is admitted for treatment and management of his acute HFpEF exacerbation.    Vernon Campbell notes that he began having worsened SOB over the last 2-3 days. Recently, he went to the Urologist on 7/3 and was diagnosed with penile cellulitis, for which he was prescribed double strength bactrim BID x7 days.  On arrival, Cr at 1.46 (b/l 0.8-1.1) and BNP of 28. However 7/8 CXR demonstrated prominent bilateral interstitial opacities consistent with venous congestion. Given 80mg  IV Lasix. Presenting ABG with 7.3/91/84/44.8 and moved to Bipap. VBG improved on recheck with venous CO2 at 87, without acidosis. Is cumulative net negative more than 6.4L during this admission, due to leaking catheter 2/2 scrotal edema. 7/10 repeat CXR with continued cephalization of pulmonary vasculature, so added Metolazone on 7/10 with improved diuresis. Is on 4L [place] today with crackles, baseline is 3L O2 via [place]. Cr improved from 1.54 to 1.25 on 7/11.    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 (HCC) Active Problems:   Hyperlipidemia   Hypothyroidism   Chronic stasis dermatitis   Restless leg syndrome   Chronic respiratory failure with hypoxia (HCC)   COPD (chronic obstructive pulmonary disease) (HCC)   Coronary artery disease involving native coronary artery   Esophageal reflux   MGUS (monoclonal gammopathy of unknown significance)   Essential hypertension   Mild pulmonary hypertension (HCC)   Obesity hypoventilation  syndrome (HCC)   OSA (obstructive sleep apnea)   BPH without urinary obstruction   Mod to Severe Aortic stenosis   Cellulitis of scrotum   Scrotal edema  Assessment and Plan: Acute HFpEF Exacerbation 7/8 CXR demonstrated prominent bilateral interstitial opacities consistent with venous congestion, and repeat on 7/10 with continued cephalization of pulm vasculature. Added spot dose of 2.5mg  Metolazone on 7/10 on top of IV 80mg  Lasix BID, and saw Cr improve from 1.54 to 1.25 with mildly less edema in abdomen. Will continue Metolazone and Lasix again today. His total outs are not accurate due to poor fitting external cath 2/2 scrotal edema, but is charted at cumulative net negative 6.4L. On 4L (3L baseline) [place].  - Continue IV Lasix 80mg  BID today - Continue dose Metolazone 2.5mg  today - Continue Spironolactone 50mg  - Strict I/Os - Daily weights   Acute on Chronic Hypoxic and Hypercapnic Respiratory Failure Presenting ABG with 7.3/91/84/44.8 and moved to Bipap, without somnolence, though EMS reported pt had some visual hallucinations. 7/8 CXR demonstrated prominent bilateral interstitial opacities consistent with venous congestion. Treating underlying insult with diuresis. Rechecked with VBG on 7/8 PM, with improvement in venous CO2 at 87. Continues Bipap at night and now breathing comfortably on 4L [place] (baseline 3L).  - Continue Bipap at night - Wean O2 [place] to baseline 3L as tolerated   COPD Exacerbation Some wheezing on exam and pt with increased O2 requirement. Though presenting with HFpEF symptoms and signs, cannot exclude overlapping COPD exacerbation, have chosen to empirically treat. - Duonebs BID - IV Methylprednisolone 40mg  BID, initiated 7/8, plan for 5 days, last dose 7/12 - Breo  Ellipta daily - Doxycycline x 5 days ending 7/13, which will also cover cellulitis, as below.   AKI Cr at 1.46 on presentation (b/l 0.8-1.1) iso Bactrim (which does falsely elevate Creatinine) and pre-renal  insult of acute HFpEF exacerbation and intravascular depletion. Cr improved to 1.25 from 1.54, in the setting of increased diuresis. 7/10 UA is unremarkable. - Will continue to monitor - Avoid nephrotoxic medications as able   Penile cellulitis Was treated with double strength Bactrim after 7/3 outpatient Urology visit. Would have completed course by Wednesday 7/10, but in setting of AKI and likely COPD exacerbation, have switched to Doxycycline. Clinically improving. - Doxycycline for 5 days, 7/9-7/13   Chronic venous stasis dermatitis Wears leg wraps (Una boots) which are changed twice a week. - Wrapped legs and elevating scrotum  - Will continue to monitor   Restless Leg Syndrome - Continue Norco-Vicodin 5-325mg  - Continue Pramipixole 1mg    CAD - Continue 81mg  ASA  - Continue Atorvastatin 20mg    Hypothyroidism - Continue Levothyroxine   BPH - Flomax 0.4mg    DVT prophylaxis: Lovenox Code Status: DNR  Family Communication:  Will ask pt Disposition Plan: back to [facility] Status is: Inpatient     Consultants:  None  Procedures:  None  Antimicrobials:  Doxycycline initiated 7/9, treat 5 days, end 7/13     Subjective: Patient seen and evaluated today with no new acute complaints or concerns. Wasn't able to sleep last night due to not being able to get comfortable in his chair, preferred not to wear bipap, but does continue on 4L [place] (baseline 3L) and notes he is breathing comfortably.   Objective: Vitals:   09/29/22 2014 09/29/22 2052 09/30/22 0349 09/30/22 0709  BP:  132/74 110/65   Pulse:  84 80   Resp:  16    Temp:  98.2 F (36.8 C) 97.7 F (36.5 C)   TempSrc:  Oral Oral   SpO2: 96% 95% 98% 98%  Weight:      Height:        Intake/Output Summary (Last 24 hours) at 09/30/2022 0930 Last data filed at 09/30/2022 0500 Gross per 24 hour  Intake 480 ml  Output 2050 ml  Net -1570 ml   Filed Weights   09/27/22 1442 09/28/22 0259 09/29/22 0500   Weight: (!) 137.1 kg 135.7 kg (!) 136.6 kg    Examination:  General exam: Appears calm and comfortable  Respiratory system: Mild diffuse crackles R>L. Respiratory effort normal. Cardiovascular system: S1 & S2 heard, RRR.  Gastrointestinal system: Abdomen is mildly taught, improved erythema over lower abdomen. 1+ edema in lower abdomen. Central nervous system: Alert and awake Extremities: BLE wrapped in Unna boots. 1+ edema above wraps over lower femurs. Skin: Chronic venous stasis changes of BLE Psychiatry: Responsive affect.    Data Reviewed: I have personally reviewed following labs and imaging studies  CBC: Recent Labs  Lab 09/27/22 1204 09/28/22 0459 09/29/22 0458 09/30/22 0449  WBC 8.6 6.0 9.0 8.9  NEUTROABS  --  5.0 7.2 7.3  HGB 14.8 14.4 14.5 15.9  HCT 49.4 49.1 48.5 51.9  MCV 104.9* 105.4* 104.3* 103.0*  PLT 177 178 194 203   Basic Metabolic Panel: Recent Labs  Lab 09/27/22 1204 09/28/22 0459 09/29/22 0458 09/30/22 0449  NA 136 137 135 135  K 4.6 4.4 4.4 4.0  CL 92* 91* 90* 87*  CO2 37* 36* 37* 38*  GLUCOSE 109* 126* 139* 170*  BUN 30* 30* 39* 48*  CREATININE  1.46* 1.37* 1.54* 1.25*  CALCIUM 8.7* 8.4* 8.9 9.0  MG 2.2 2.2 2.2 2.1   GFR: Estimated Creatinine Clearance: 60.4 mL/min (A) (by C-G formula based on SCr of 1.25 mg/dL (H)). Liver Function Tests: No results for input(s): "AST", "ALT", "ALKPHOS", "BILITOT", "PROT", "ALBUMIN" in the last 168 hours. No results for input(s): "LIPASE", "AMYLASE" in the last 168 hours. No results for input(s): "AMMONIA" in the last 168 hours. Coagulation Profile: No results for input(s): "INR", "PROTIME" in the last 168 hours. Cardiac Enzymes: No results for input(s): "CKTOTAL", "CKMB", "CKMBINDEX", "TROPONINI" in the last 168 hours. BNP (last 3 results) No results for input(s): "PROBNP" in the last 8760 hours. HbA1C: No results for input(s): "HGBA1C" in the last 72 hours. CBG: No results for input(s):  "GLUCAP" in the last 168 hours. Lipid Profile: No results for input(s): "CHOL", "HDL", "LDLCALC", "TRIG", "CHOLHDL", "LDLDIRECT" in the last 72 hours. Thyroid Function Tests: No results for input(s): "TSH", "T4TOTAL", "FREET4", "T3FREE", "THYROIDAB" in the last 72 hours. Anemia Panel: No results for input(s): "VITAMINB12", "FOLATE", "FERRITIN", "TIBC", "IRON", "RETICCTPCT" in the last 72 hours. Sepsis Labs: Recent Labs  Lab 09/27/22 1450  LATICACIDVEN 1.2    Recent Results (from the past 240 hour(s))  MRSA Next Gen by PCR, Nasal     Status: None   Collection Time: 09/27/22  3:20 PM   Specimen: Nasal Mucosa; Nasal Swab  Result Value Ref Range Status   MRSA by PCR Next Gen NOT DETECTED NOT DETECTED Final    Comment: (NOTE) The GeneXpert MRSA Assay (FDA approved for NASAL specimens only), is one component of a comprehensive MRSA colonization surveillance program. It is not intended to diagnose MRSA infection nor to guide or monitor treatment for MRSA infections. Test performance is not FDA approved in patients less than 52 years old. Performed at [facility], [address] [place], [place] [address]          Radiology Studies: [facility] Chest [place] 1 View  Result Date: 09/29/2022 CLINICAL DATA:  82 year old male with history of heart failure with preserved ejection fraction. History of COPD. Hypertension. EXAM: PORTABLE CHEST 1 VIEW COMPARISON:  Chest x-ray 09/27/2022. FINDINGS: Elevation of the left hemidiaphragm unchanged. Opacity at the left base increasing compared to the prior study, which may reflect evolving atelectasis and/or consolidation, likely with superimposed small left pleural effusion. No pneumothorax. There is cephalization of the pulmonary vasculature and slight indistinctness of the interstitial markings suggestive of mild pulmonary edema. Mild cardiomegaly. The patient is rotated to the right on today's exam, resulting in distortion of the mediastinal contours and  reduced diagnostic sensitivity and specificity for mediastinal pathology. IMPRESSION: 1. The appearance of the chest suggests mild congestive heart failure, as above. 2. Worsening aeration at the left lung base which may reflect increasing areas of atelectasis and/or consolidation, along with superimposed small left pleural effusion. 3. Elevation of the left hemidiaphragm again noted. Electronically Signed   By: Trudie Reed M.D.   On: 09/29/2022 05:45        Scheduled Meds:  aspirin EC  81 mg Oral Daily   atorvastatin  20 mg Oral QHS   doxycycline  100 mg Oral Q12H   enoxaparin (LOVENOX) injection  65 mg Subcutaneous Q24H   fluticasone furoate-vilanterol  1 puff Inhalation Daily   furosemide  80 mg Intravenous BID   ipratropium-albuterol  3 mL Nebulization BID   levothyroxine  175 mcg Oral QAC breakfast   methylPREDNISolone (SOLU-MEDROL) injection  40 mg Intravenous Q12H  metolazone  2.5 mg Oral Once   pantoprazole  40 mg Oral QHS   polyethylene glycol  17 g Oral Daily   potassium chloride  20 mEq Oral BID   pramipexole  1 mg Oral QHS   senna-docusate  2 tablet Oral QHS   sodium chloride flush  3 mL Intravenous Q12H   spironolactone  50 mg Oral Daily   tamsulosin  0.4 mg Oral QPC supper   Continuous Infusions:  sodium chloride       LOS: 3 days    Time spent: 35 minutes  Signed,  Marcelline Mates, MS4 Working with Dr. Maurilio Lovely

## 2022-10-01 LAB — CBC WITH DIFFERENTIAL/PLATELET
Abs Immature Granulocytes: 0.07 10*3/uL (ref 0.00–0.07)
Basophils Absolute: 0 10*3/uL (ref 0.0–0.1)
Basophils Relative: 0 %
Eosinophils Absolute: 0 10*3/uL (ref 0.0–0.5)
Eosinophils Relative: 0 %
HCT: 51.3 % (ref 39.0–52.0)
Hemoglobin: 15.8 g/dL (ref 13.0–17.0)
Immature Granulocytes: 1 %
Lymphocytes Relative: 11 %
Lymphs Abs: 1.1 10*3/uL (ref 0.7–4.0)
MCH: 31.3 pg (ref 26.0–34.0)
MCHC: 30.8 g/dL (ref 30.0–36.0)
MCV: 101.8 fL — ABNORMAL HIGH (ref 80.0–100.0)
Monocytes Absolute: 1.1 10*3/uL — ABNORMAL HIGH (ref 0.1–1.0)
Monocytes Relative: 11 %
Neutro Abs: 7.5 10*3/uL (ref 1.7–7.7)
Neutrophils Relative %: 77 %
Platelets: 193 10*3/uL (ref 150–400)
RBC: 5.04 MIL/uL (ref 4.22–5.81)
RDW: 15.1 % (ref 11.5–15.5)
WBC: 9.7 10*3/uL (ref 4.0–10.5)
nRBC: 0 % (ref 0.0–0.2)

## 2022-10-01 LAB — BASIC METABOLIC PANEL
Anion gap: 12 (ref 5–15)
BUN: 55 mg/dL — ABNORMAL HIGH (ref 8–23)
CO2: 40 mmol/L — ABNORMAL HIGH (ref 22–32)
Calcium: 9 mg/dL (ref 8.9–10.3)
Chloride: 84 mmol/L — ABNORMAL LOW (ref 98–111)
Creatinine, Ser: 1.23 mg/dL (ref 0.61–1.24)
GFR, Estimated: 58 mL/min — ABNORMAL LOW (ref >60)
Glucose, Bld: 131 mg/dL — ABNORMAL HIGH (ref 70–99)
Potassium: 3.8 mmol/L (ref 3.5–5.1)
Sodium: 136 mmol/L (ref 135–145)

## 2022-10-01 LAB — MAGNESIUM: Magnesium: 2.2 mg/dL (ref 1.7–2.4)

## 2022-10-01 MED ORDER — ENOXAPARIN SODIUM 60 MG/0.6ML IJ SOSY
60.0000 mg | PREFILLED_SYRINGE | INTRAMUSCULAR | Status: DC
  Administered 2022-10-01 – 2022-10-03 (×3): 60 mg via SUBCUTANEOUS
  Filled 2022-10-01 (×3): qty 0.6

## 2022-10-01 MED ORDER — METOLAZONE 5 MG PO TABS
2.5000 mg | ORAL_TABLET | Freq: Once | ORAL | Status: AC
  Administered 2022-10-01: 2.5 mg via ORAL
  Filled 2022-10-01: qty 1

## 2022-10-01 NOTE — Progress Notes (Signed)
Physical Therapy Treatment Patient Details Name: Aireon Jalali MRN: [id number] DOB: 02/26/1941 Today's Date: 10/01/2022   History of Present Illness Brijesh Vite is a 82 y.o. male with medical history significant for HFpEF, COPD c/b chronic respiratory failure with hypoxia (on baseline 3L [place]), CAD, HTN, obesity-hypoventilation syndrome, HLD, hypothyroidism, OSA and restless leg syndrome who presents with increasing dyspnea and 10 pound weight gain over the last few days, and is admitted for treatment and management of his acute HFpEF exacerbation.      Mr Noguez notes that he began having worsened SOB over the last 2-3 days. He also notes that he isn't weighed every day, but has gained at least ten pounds in the last 5 days. He denies chest pain. His O2 requirement is increased from baseline 3L to 4-5L currently. While being transported to [facility] ED via EMS, he reportedly had a few hallucinations, not consistent with his baseline, but adds that he wasn't able to sleep last night. Typically, he takes Torsemide 60mg  qAM and 40mg  at bedtime, and wears Una boots. He has chronic venous stasis dermatitis and notes he also has restless legs.    PT Comments  Patient seated on BSC at beginning of session. Assisted patient to standing and back to bed following. Patient completes seated exercises  with good mechanics following initial cueing. Patient will benefit from continued skilled physical therapy in hospital and recommended venue below to increase strength, balance, endurance for safe ADLs and gait.     Assistance Recommended at Discharge Intermittent Supervision/Assistance  If plan is discharge home, recommend the following:  Can travel by private vehicle    Assist for transportation;Assistance with cooking/housework;A little help with walking and/or transfers;A little help with bathing/dressing/bathroom   No  Equipment Recommendations  None recommended by PT    Recommendations for Other Services        Precautions / Restrictions Precautions Precautions: Fall Precaution Comments: patient reports at least 2 falls in the last six months Restrictions Weight Bearing Restrictions: No     Mobility  Bed Mobility               General bed mobility comments: seated on BSC at beginning of session    Transfers Overall transfer level: Needs assistance Equipment used: Rolling walker (2 wheels) Transfers: Sit to/from Stand, Bed to chair/wheelchair/BSC Sit to Stand: Min guard, Min assist Stand pivot transfers: Min guard, Min assist Step pivot transfers: Min guard, Min assist            Ambulation/Gait Ambulation/Gait assistance: Min guard Gait Distance (Feet): 2 Feet Assistive device: Rolling walker (2 wheels) Gait Pattern/deviations: Step-through pattern       General Gait Details: small steps from [facility] to bed   Stairs             Wheelchair Mobility     Tilt Bed    Modified Rankin (Stroke Patients Only)       Balance Overall balance assessment: History of Falls, Needs assistance Sitting-balance support: Bilateral upper extremity supported, Feet supported Sitting balance-Leahy Scale: Good Sitting balance - Comments: seated at EOB   Standing balance support: Bilateral upper extremity supported, During functional activity, Reliant on assistive device for balance Standing balance-Leahy Scale: Fair Standing balance comment: holding bed rail and RW                            Cognition Arousal/Alertness: Awake/alert Behavior During Therapy: WFL for tasks assessed/performed Overall  Cognitive Status: Within Functional Limits for tasks assessed                                          Exercises General Exercises - Lower Extremity Ankle Circles/Pumps: AROM, Both, 10 reps, Seated Long Arc Quad: AROM, Both, 10 reps, Seated Hip Flexion/Marching: AROM, Both, 10 reps, Seated    General Comments        Pertinent  Vitals/Pain Pain Assessment Pain Assessment: No/denies pain    Home Living                          Prior Function            PT Goals (current goals can now be found in the care plan section) Acute Rehab PT Goals Patient Stated Goal: Go back to [name]. PT Goal Formulation: With patient Time For Goal Achievement: 10/12/22 Potential to Achieve Goals: Fair Progress towards PT goals: Progressing toward goals    Frequency    Min 2X/week      PT Plan Current plan remains appropriate    Co-evaluation              AM-PAC PT "6 Clicks" Mobility   Outcome Measure  Help needed turning from your back to your side while in a flat bed without using bedrails?: A Little Help needed moving from lying on your back to sitting on the side of a flat bed without using bedrails?: A Little Help needed moving to and from a bed to a chair (including a wheelchair)?: A Little Help needed standing up from a chair using your arms (e.g., wheelchair or bedside chair)?: A Little Help needed to walk in hospital room?: Total Help needed climbing 3-5 steps with a railing? : Total 6 Click Score: 14    End of Session Equipment Utilized During Treatment: Oxygen Activity Tolerance: Patient tolerated treatment well;Patient limited by fatigue Patient left: in bed;with call bell/phone within reach Nurse Communication: Mobility status PT Visit Diagnosis: Other abnormalities of gait and mobility (R26.89);History of falling (Z91.81);Unsteadiness on feet (R26.81);Muscle weakness (generalized) (M62.81)     Time: 1610-9604 PT Time Calculation (min) (ACUTE ONLY): 10 min  Charges:    $Therapeutic Activity: 8-22 mins PT General Charges $$ ACUTE PT VISIT: 1 Visit                     2:38 PM, 10/01/22 Wyman Songster PT, DPT Physical Therapist at [facility]

## 2022-10-01 NOTE — Progress Notes (Signed)
PROGRESS NOTE    Vernon Campbell  ZOX:[id number] DOB: 1940-07-09 DOA: 09/27/2022 PCP: System, Provider Not In   Brief Narrative:  Vernon Campbell is a 82 y.o. male with medical history significant for HFpEF, COPD c/b chronic respiratory failure with hypoxia (on baseline 3L [place]), CAD, HTN, obesity-hypoventilation syndrome, HLD, hypothyroidism, OSA and restless leg syndrome who presented with increasing dyspnea and 10 pound weight gain over the last few days, and is admitted for treatment and management of his acute HFpEF exacerbation.    Mr Weisenberg notes that he began having worsened SOB over the last 2-3 days. Recently, he went to the Urologist on 7/3 and was diagnosed with penile cellulitis, for which he was prescribed double strength bactrim BID x7 days.  On arrival, Cr at 1.46 (b/l 0.8-1.1) and BNP of 28. However 7/8 CXR demonstrated prominent bilateral interstitial opacities consistent with venous congestion. Given 80mg  IV Lasix. Presenting ABG with 7.3/91/84/44.8 and moved to Bipap. VBG improved on recheck with venous CO2 at 87, without acidosis. Is cumulative net negative more than 6.4L during this admission, due to leaking catheter 2/2 scrotal edema. 7/10 repeat CXR with continued cephalization of pulmonary vasculature, so added Metolazone on 7/10 and 7/11 with improved diuresis. Is on 4L [place] today with resolved crackles, baseline is 3L O2 via [place]. Cr stable at 1.23 on 7/12. Will continue Metolazone and Lasix, discontinue steroids day 5/5 today from COPD exacerbation.    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 (HCC) Active Problems:   Hyperlipidemia   Hypothyroidism   Chronic stasis dermatitis   Restless leg syndrome   Chronic respiratory failure with hypoxia (HCC)   COPD (chronic obstructive pulmonary disease) (HCC)   Coronary artery disease involving native coronary artery   Esophageal reflux   MGUS (monoclonal gammopathy of unknown  significance)   Essential hypertension   Mild pulmonary hypertension (HCC)   Obesity hypoventilation syndrome (HCC)   OSA (obstructive sleep apnea)   BPH without urinary obstruction   Mod to Severe Aortic stenosis   Cellulitis of scrotum   Scrotal edema  Assessment and Plan: Acute HFpEF Exacerbation 7/8 CXR demonstrated prominent bilateral interstitial opacities consistent with venous congestion, and repeat on 7/10 with continued cephalization of pulm vasculature. Added spot dose of 2.5mg  Metolazone on 7/10 and 7/11 on top of IV 80mg  Lasix BID, and saw Cr improve from 1.54 to 1.25 with decreasing peripheral edema. Will continue Metolazone and Lasix again today. His total outs are not accurate due to poor fitting external cath 2/2 scrotal edema, but is charted at cumulative net negative 10.9L with 4.5L net output yesterday. On 4L (3L baseline) [place].   - Continue IV Lasix 80mg  BID today - Continue dose Metolazone 2.5mg  today - Continue Spironolactone 50mg  - Strict I/Os - Daily weights   Acute on Chronic Hypoxic and Hypercapnic Respiratory Failure Presenting ABG with 7.3/91/84/44.8 and moved to Bipap, without somnolence, though EMS reported pt had some visual hallucinations. 7/8 CXR demonstrated prominent bilateral interstitial opacities consistent with venous congestion. Treating underlying insult with diuresis. Rechecked with VBG on 7/8 PM, with improvement in venous CO2 at 87. Continues Bipap at night and now breathing comfortably on 4L [place] (baseline 3L).  - Continue Bipap at night - Wean O2 [place] to baseline 3L as tolerated   COPD Exacerbation Presented with HFpEF symptoms and signs, with likely overlapping COPD exacerbation, and chose to empirically treat. Has improved markedly over this admission, and will now discontinue  steroids, initiated 7/8 and completed 5 days on 7/12. - Duonebs BID - Discontinue IV Methylprednisolone 40mg  BID - Breo Ellipta daily - Doxycycline x 5 days ending 7/13,  which will also cover cellulitis, as below.   AKI Cr at 1.46 on presentation (b/l 0.8-1.1) iso Bactrim (which does falsely elevate Creatinine) and pre-renal insult of acute HFpEF exacerbation and intravascular depletion. Cr improved to 1.25 from 1.54, in the setting of increased diuresis, and is stable at 1.23 today, 7/12. 7/10 UA was unremarkable. - Will continue to monitor - Avoid nephrotoxic medications as able   Penile cellulitis Was treated with double strength Bactrim after 7/3 outpatient Urology visit. Would have completed course by Wednesday 7/10, but in setting of AKI and likely COPD exacerbation, have switched to Doxycycline. Clinically improving. - Doxycycline for 5 days, 7/9-7/13   Chronic venous stasis dermatitis Wears leg wraps (Una boots) which are changed twice a week. - Wrapped legs and elevating scrotum  - Will continue to monitor   Restless Leg Syndrome - Continue Norco-Vicodin 5-325mg  - Continue Pramipixole 1mg    CAD - Continue 81mg  ASA  - Continue Atorvastatin 20mg    Hypothyroidism - Continue Levothyroxine   BPH - Flomax 0.4mg    DVT prophylaxis: Lovenox Code Status: DNR  Family Communication:  Will ask pt Disposition Plan: back to [facility] Status is: Inpatient    Consultants:  None  Procedures:  None  Antimicrobials:  Doxycycline initiated 7/9, treat 5 days, end 7/13     Subjective: Patient seen and evaluated today with no new acute complaints or concerns. Notes he is feeling more comfortable each day in terms of breathing and leg swelling. He is eating breakfast this morning and denies any concerns.  Objective: Vitals:   09/30/22 2118 10/01/22 0536 10/01/22 0807 10/01/22 0810  BP: 126/72 125/85    Pulse: 79 78    Resp: 20 18    Temp: 98.6 F (37 C) 97.7 F (36.5 C)    TempSrc: Oral Oral    SpO2: 92% 94% 93% 96%  Weight:  129.3 kg    Height:        Intake/Output Summary (Last 24 hours) at 10/01/2022 0957 Last data  filed at 10/01/2022 0536 Gross per 24 hour  Intake 720 ml  Output 5500 ml  Net -4780 ml   Filed Weights   09/28/22 0259 09/29/22 0500 10/01/22 0536  Weight: 135.7 kg (!) 136.6 kg 129.3 kg    Examination:  General exam: Appears calm and comfortable  Respiratory system: Clear to auscultation bilaterally. On 4L [place]. Respiratory effort normal. Cardiovascular system: S1 & S2 heard, RRR.  Gastrointestinal system: Abdomen is fairly soft, improved erythema over lower abdomen. Resolved edema in lower abdomen. Central nervous system: Alert and awake Extremities: BLE wrapped in Unna boots. 1+ edema above wraps over lower femurs. Skin: Chronic venous stasis changes of BLE Psychiatry: Responsive affect.   Data Reviewed: I have personally reviewed following labs and imaging studies  CBC: Recent Labs  Lab 09/27/22 1204 09/28/22 0459 09/29/22 0458 09/30/22 0449 10/01/22 0409  WBC 8.6 6.0 9.0 8.9 9.7  NEUTROABS  --  5.0 7.2 7.3 7.5  HGB 14.8 14.4 14.5 15.9 15.8  HCT 49.4 49.1 48.5 51.9 51.3  MCV 104.9* 105.4* 104.3* 103.0* 101.8*  PLT 177 178 194 203 193   Basic Metabolic Panel: Recent Labs  Lab 09/27/22 1204 09/28/22 0459 09/29/22 0458 09/30/22 0449 10/01/22 0409  NA 136 137 135 135 136  K 4.6 4.4 4.4  4.0 3.8  CL 92* 91* 90* 87* 84*  CO2 37* 36* 37* 38* 40*  GLUCOSE 109* 126* 139* 170* 131*  BUN 30* 30* 39* 48* 55*  CREATININE 1.46* 1.37* 1.54* 1.25* 1.23  CALCIUM 8.7* 8.4* 8.9 9.0 9.0  MG 2.2 2.2 2.2 2.1 2.2   GFR: Estimated Creatinine Clearance: 59.5 mL/min (by C-G formula based on SCr of 1.23 mg/dL). Liver Function Tests: No results for input(s): "AST", "ALT", "ALKPHOS", "BILITOT", "PROT", "ALBUMIN" in the last 168 hours. No results for input(s): "LIPASE", "AMYLASE" in the last 168 hours. No results for input(s): "AMMONIA" in the last 168 hours. Coagulation Profile: No results for input(s): "INR", "PROTIME" in the last 168 hours. Cardiac Enzymes: No results for  input(s): "CKTOTAL", "CKMB", "CKMBINDEX", "TROPONINI" in the last 168 hours. BNP (last 3 results) No results for input(s): "PROBNP" in the last 8760 hours. HbA1C: No results for input(s): "HGBA1C" in the last 72 hours. CBG: No results for input(s): "GLUCAP" in the last 168 hours. Lipid Profile: No results for input(s): "CHOL", "HDL", "LDLCALC", "TRIG", "CHOLHDL", "LDLDIRECT" in the last 72 hours. Thyroid Function Tests: No results for input(s): "TSH", "T4TOTAL", "FREET4", "T3FREE", "THYROIDAB" in the last 72 hours. Anemia Panel: No results for input(s): "VITAMINB12", "FOLATE", "FERRITIN", "TIBC", "IRON", "RETICCTPCT" in the last 72 hours. Sepsis Labs: Recent Labs  Lab 09/27/22 1450  LATICACIDVEN 1.2    Recent Results (from the past 240 hour(s))  MRSA Next Gen by PCR, Nasal     Status: None   Collection Time: 09/27/22  3:20 PM   Specimen: Nasal Mucosa; Nasal Swab  Result Value Ref Range Status   MRSA by PCR Next Gen NOT DETECTED NOT DETECTED Final    Comment: (NOTE) The GeneXpert MRSA Assay (FDA approved for NASAL specimens only), is one component of a comprehensive MRSA colonization surveillance program. It is not intended to diagnose MRSA infection nor to guide or monitor treatment for MRSA infections. Test performance is not FDA approved in patients less than 4 years old. Performed at [facility], [address] [place], [place] [address]          Radiology Studies: No results found.      Scheduled Meds:  aspirin EC  81 mg Oral Daily   atorvastatin  20 mg Oral QHS   doxycycline  100 mg Oral Q12H   enoxaparin (LOVENOX) injection  60 mg Subcutaneous Q24H   fluticasone furoate-vilanterol  1 puff Inhalation Daily   furosemide  80 mg Intravenous BID   ipratropium-albuterol  3 mL Nebulization BID   levothyroxine  175 mcg Oral QAC breakfast   metolazone  2.5 mg Oral Once   pantoprazole  40 mg Oral QHS   polyethylene glycol  17 g Oral Daily   potassium chloride   20 mEq Oral BID   pramipexole  1 mg Oral QHS   senna-docusate  2 tablet Oral QHS   sodium chloride flush  3 mL Intravenous Q12H   spironolactone  50 mg Oral Daily   tamsulosin  0.4 mg Oral QPC supper   Continuous Infusions:  sodium chloride       LOS: 4 days    Time spent: 35 minutes  Signed,  Marcelline Mates, MS4 Working with Dr. Maurilio Lovely

## 2022-10-01 NOTE — Care Management Important Message (Signed)
Important Message  Patient Details  Name: Vernon Campbell MRN: [id number] Date of Birth: [birth year]   Medicare Important Message Given:  Yes     [name] 10/01/2022, 12:26 PM

## 2022-10-01 NOTE — Progress Notes (Signed)
Cental tele called that patient had 13 beat run of SVT. Patient was being transferred to the side of the bed. Patient assessed, no new findings noted

## 2022-10-01 NOTE — Plan of Care (Signed)
Patient was restless, easier to breath when he sits on the side of bed. Still requires oxygen ( 4L/m)

## 2022-10-02 LAB — BASIC METABOLIC PANEL
Anion gap: 10 (ref 5–15)
BUN: 61 mg/dL — ABNORMAL HIGH (ref 8–23)
CO2: 44 mmol/L — ABNORMAL HIGH (ref 22–32)
Calcium: 9 mg/dL (ref 8.9–10.3)
Chloride: 82 mmol/L — ABNORMAL LOW (ref 98–111)
Creatinine, Ser: 1.3 mg/dL — ABNORMAL HIGH (ref 0.61–1.24)
GFR, Estimated: 54 mL/min — ABNORMAL LOW (ref >60)
Glucose, Bld: 125 mg/dL — ABNORMAL HIGH (ref 70–99)
Potassium: 3.8 mmol/L (ref 3.5–5.1)
Sodium: 136 mmol/L (ref 135–145)

## 2022-10-02 LAB — CBC
HCT: 55.8 % — ABNORMAL HIGH (ref 39.0–52.0)
Hemoglobin: 17.4 g/dL — ABNORMAL HIGH (ref 13.0–17.0)
MCH: 31.5 pg (ref 26.0–34.0)
MCHC: 31.2 g/dL (ref 30.0–36.0)
MCV: 101.1 fL — ABNORMAL HIGH (ref 80.0–100.0)
Platelets: 187 10*3/uL (ref 150–400)
RBC: 5.52 MIL/uL (ref 4.22–5.81)
RDW: 15.2 % (ref 11.5–15.5)
WBC: 10.3 10*3/uL (ref 4.0–10.5)
nRBC: 0 % (ref 0.0–0.2)

## 2022-10-02 LAB — MAGNESIUM: Magnesium: 2.1 mg/dL (ref 1.7–2.4)

## 2022-10-02 MED ORDER — TORSEMIDE 60 MG PO TABS: 60.0000 mg | ORAL_TABLET | Freq: Two times a day (BID) | ORAL | 0 refills | Status: DC

## 2022-10-02 MED ORDER — TORSEMIDE 20 MG PO TABS
60.0000 mg | ORAL_TABLET | Freq: Two times a day (BID) | ORAL | Status: DC
  Administered 2022-10-02 – 2022-10-03 (×4): 60 mg via ORAL
  Filled 2022-10-02 (×4): qty 3

## 2022-10-02 MED ORDER — DOXYCYCLINE HYCLATE 100 MG PO TABS: 100.0000 mg | ORAL_TABLET | Freq: Two times a day (BID) | ORAL | 0 refills | Status: AC

## 2022-10-02 MED ORDER — HYDROCODONE-ACETAMINOPHEN 5-325 MG PO TABS: 1.0000 | ORAL_TABLET | Freq: Three times a day (TID) | ORAL | 0 refills | Status: DC | PRN

## 2022-10-02 NOTE — Plan of Care (Signed)

## 2022-10-02 NOTE — TOC Progression Note (Signed)
Transition of Care [facility]) - Progression Note    Patient Details  Name: Hanzo Flyte MRN: [id number] Date of Birth: 1940/09/05  [facility] CM/SW Contact  Catalina [place]vel, [place] Phone Number: 10/02/2022, 10:47 AM  Clinical Narrative:    Pt clear for DC, CSW contacted [facility] to determine if he can return today- he is a resident prior- not new admission.  Call to facility,  no one on staff could assist.  Called [facility] after hour contact Heather as a point of contact- no response to text.  TOC to continue to follow.    Expected Discharge Plan: Long Term Nursing Home Barriers to Discharge: Other (must enter comment) (CSW has call to facility pending response.)  Expected Discharge Plan and Services         Expected Discharge Date: 10/02/22                                     Social Determinants of Health (SDOH) Interventions SDOH Screenings   Food Insecurity: No Food Insecurity (09/27/2022)  Housing: Low Risk  (09/27/2022)  Transportation Needs: No Transportation Needs (09/27/2022)  Utilities: Not At Risk (09/27/2022)  Social Connections: Unknown (07/31/2021)   Received from [facility]  Tobacco Use: Medium Risk (09/28/2022)    Readmission Risk Interventions    09/28/2022    2:05 PM 12/14/2021   12:46 PM  Readmission Risk Prevention Plan  Transportation Screening Complete Complete  PCP or Specialist Appt within 3-5 Days Complete   HRI or Home Care Consult Complete Complete  Social Work Consult for Recovery Care Planning/Counseling Complete Complete  Palliative Care Screening Not Applicable Not Applicable  Medication Review Oceanographer) Complete Complete

## 2022-10-02 NOTE — TOC Progression Note (Signed)
Transition of Care [facility]) - Progression Note    Patient Details  Name: Vernon Campbell MRN: [id number] Date of Birth: May 28, 1940  Transition of Care [facility]) CM/SW Contact  Catalina Gravel, [place] Phone Number: 10/02/2022, 12:36 PM  Clinical Narrative:    CSW reached out to facility as pt cleared for DC.  Facility could not assist as no one on site to assist with admission or return.  CSW contacted Melissa coverage TOC contact.  Melissa informed that pt can not be accepted until Mon as supervisor out and not available for an intake this weekend.  TOC will follow up Monday.     Expected Discharge Plan: Long Term Nursing Home Barriers to Discharge: No SNF bed  Expected Discharge Plan and Services         Expected Discharge Date: 10/02/22                                     Social Determinants of Health (SDOH) Interventions SDOH Screenings   Food Insecurity: No Food Insecurity (09/27/2022)  Housing: Low Risk  (09/27/2022)  Transportation Needs: No Transportation Needs (09/27/2022)  Utilities: Not At Risk (09/27/2022)  Social Connections: Unknown (07/31/2021)   Received from [facility]  Tobacco Use: Medium Risk (09/28/2022)    Readmission Risk Interventions    09/28/2022    2:05 PM 12/14/2021   12:46 PM  Readmission Risk Prevention Plan  Transportation Screening Complete Complete  PCP or Specialist Appt within 3-5 Days Complete   HRI or Home Care Consult Complete Complete  Social Work Consult for Recovery Care Planning/Counseling Complete Complete  Palliative Care Screening Not Applicable Not Applicable  Medication Review Oceanographer) Complete Complete

## 2022-10-02 NOTE — Discharge Summary (Addendum)
Physician Discharge Summary  Vernon Campbell TDV:[id number] DOB: Oct 11, 1940 DOA: 09/27/2022  PCP: System, Provider Not In  Admit date: 09/27/2022  Discharge date: 10/04/2022  Admitted From:SNF  Disposition:  SNF  Recommendations for Outpatient Follow-up:  Follow up with PCP in 1-2 weeks Follow up BMP in 3-5 days to ensure Cr trending back to baseline Remain now on torsemide and spironolactone as prescribed starting 7/17 and continue to monitor daily weights and contact provider regarding any weight gain of 3 pounds in 24 hours or 5 pounds in 1 week Monitor BMP in 1 week Continue doxycycline for 6 more days as prescribed for total 10-day course of treatment for cellulitis Continue other home medications as prior Continue outpatient palliative services  Home Health: None  Equipment/Devices: Has home oxygen 3 L nasal cannula  Discharge Condition:Stable  CODE STATUS: DNR  Diet recommendation: Heart Healthy  Brief/Interim Summary: Vernon Campbell is a 82 y.o. male with medical history significant for HFpEF, COPD c/b chronic respiratory failure with hypoxia (on baseline 3L [place]), CAD, HTN, obesity-hypoventilation syndrome, HLD, hypothyroidism, OSA and restless leg syndrome who presented with increasing dyspnea and 10 pound weight gain over the last few days, and is admitted for treatment and management of his acute HFpEF exacerbation.  He stayed in the hospital several days for diuresis and was also noted to have mild COPD exacerbation and was on breathing treatments as well as IV steroids.  Additionally, he was noted to have penile and scrotal cellulitis for which he remains on doxycycline for total 10-day course.  He is now in stable condition for discharge and appears to have received maximal medical benefit from his diuresis despite the fact that he is not completely euvolemic.  He will receive outpatient palliative services at his long-term care facility.  No other acute events or concerns noted  throughout the course of this admission.  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 (HCC) Active Problems:   Chronic respiratory failure with hypoxia (HCC)   Hyperlipidemia   Hypothyroidism   Chronic stasis dermatitis   Restless leg syndrome   COPD (chronic obstructive pulmonary disease) (HCC)   Coronary artery disease involving native coronary artery   Esophageal reflux   MGUS (monoclonal gammopathy of unknown significance)   Essential hypertension   Mild pulmonary hypertension (HCC)   Obesity hypoventilation syndrome (HCC)   OSA (obstructive sleep apnea)   BPH without urinary obstruction   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  Associated penile/scrotal cellulitis.  Discharge Instructions  Discharge Instructions     Diet - low sodium heart healthy   Complete by: As directed    Increase activity slowly   Complete by: As directed       Allergies as of 10/04/2022   No Known Allergies      Medication List     STOP taking these medications    sulfamethoxazole-trimethoprim 800-160 MG tablet Commonly known as: BACTRIM DS       TAKE these medications    albuterol (2.5 MG/3ML) 0.083% nebulizer solution Commonly known as: PROVENTIL Take 3 mLs (2.5 mg total) by nebulization every 2 (two) hours as needed for shortness of breath.   atorvastatin 20 MG tablet Commonly known as: LIPITOR Take 20 mg by mouth every evening.   bacitracin 500  UNIT/GM ointment Apply 1 Application topically 2 (two) times daily.   doxycycline 100 MG tablet Commonly known as: VIBRA-TABS Take 1 tablet (100 mg total) by mouth every 12 (twelve) hours for 4 days.   fluticasone furoate-vilanterol 100-25 MCG/ACT Aepb Commonly  known as: BREO ELLIPTA Inhale 1 puff into the lungs daily.   HYDROcodone-acetaminophen 5-325 MG tablet Commonly known as: NORCO/VICODIN Take 1 tablet by mouth every 8 (eight) hours as needed for moderate pain.   ketoconazole 2 % shampoo Commonly known as: NIZORAL Apply 1 application  topically 2 (two) times a week. Apply to scalp (shampoo) topically during day shift every Tuesday and Friday for tinea versicolor (bath days)   lactulose 10 GM/15ML solution Commonly known as: CHRONULAC Take 15 mLs by mouth daily as needed (constipation).   levothyroxine 175 MCG tablet Commonly known as: SYNTHROID Take 175 mcg by mouth daily before breakfast.   OXYGEN Inhale 2 L/min into the lungs continuous.   polyethylene glycol 17 g packet Commonly known as: MIRALAX / GLYCOLAX Take 17 g by mouth daily.   potassium chloride SA 20 MEQ tablet Commonly known as: KLOR-CON M Take 20 mEq by mouth daily.   pramipexole 1 MG tablet Commonly known as: MIRAPEX Take 1 tablet (1 mg total) by mouth at bedtime.   senna 8.6 MG Tabs tablet Commonly known as: SENOKOT Take 17.2 mg by mouth at bedtime.   spironolactone 50 MG tablet Commonly known as: ALDACTONE Take 1 tablet (50 mg total) by mouth daily. Start taking on: October 06, 2022 What changed: These instructions start on October 06, 2022. If you are unsure what to do until then, ask your doctor or other care provider.   tamsulosin 0.4 MG Caps capsule Commonly known as: FLOMAX Take 1 capsule (0.4 mg total) by mouth daily after supper. What changed: when to take this   Torsemide 40 MG Tabs Take 40 mg by mouth See admin instructions. Take 60 mg every morning and 40 mg every evening Start taking on: October 06, 2022 What changed:  These instructions start on October 06, 2022. If you are unsure what to do until then, ask your doctor or other care provider. Another medication with the same name was removed. Continue taking this medication, and follow the  directions you see here.        Contact information for after-discharge care     Destination     [facility] .   Service: Skilled Nursing Contact information: [address] [place] [address] [phone]                    No Known Allergies  Consultations: None   Procedures/Studies: DG Chest Port 1 View  Result Date: 09/29/2022 CLINICAL DATA:  82 year old male with history of heart failure with preserved ejection fraction. History of COPD. Hypertension. EXAM: PORTABLE CHEST 1 VIEW COMPARISON:  Chest x-ray 09/27/2022. FINDINGS: Elevation of the left hemidiaphragm unchanged. Opacity at the left base increasing compared to the prior study, which may reflect evolving atelectasis and/or consolidation, likely with superimposed small left pleural effusion. No pneumothorax. There is cephalization of the pulmonary vasculature and slight indistinctness of the interstitial markings suggestive of mild pulmonary edema. Mild cardiomegaly. The patient is rotated to the right on today's exam, resulting in distortion of the mediastinal contours and reduced diagnostic sensitivity and specificity for mediastinal pathology. IMPRESSION: 1. The appearance of the chest suggests mild congestive heart failure, as above. 2. Worsening aeration at the left lung  base which may reflect increasing areas of atelectasis and/or consolidation, along with superimposed small left pleural effusion. 3. Elevation of the left hemidiaphragm again noted. Electronically Signed   By: Trudie Reed M.D.   On: 09/29/2022 05:45   DG Chest Portable 1 View  Result Date: 09/27/2022 CLINICAL DATA:  sob EXAM: PORTABLE CHEST 1 VIEW COMPARISON:  CXR 07/05/22 FINDINGS: Cardiomegaly. Trace left pleural effusion. Unchanged hazy opacity at the left lung base could represent atelectasis or infection. Prominent bilateral interstitial opacities most likely represent pulmonary venous congestion. No  radiographically apparent displaced rib fractures. Visualized upper abdomen is notable for colonic gaseous distention. IMPRESSION: 1. Cardiomegaly with pulmonary venous congestion. 2. Trace left pleural effusion. Electronically Signed   By: Lorenza Cambridge M.D.   On: 09/27/2022 11:41     Discharge Exam: Vitals:   10/03/22 2046 10/04/22 0638  BP: (!) 91/57 (!) 103/59  Pulse: 86 89  Resp: 20   Temp: 97.9 F (36.6 C) 97.7 F (36.5 C)  SpO2: 97% 95%   Vitals:   10/03/22 1944 10/03/22 2046 10/04/22 0500 10/04/22 0638  BP:  (!) 91/57  (!) 103/59  Pulse:  86  89  Resp:  20    Temp:  97.9 F (36.6 C)  97.7 F (36.5 C)  TempSrc:  Oral  Oral  SpO2: 93% 97%  95%  Weight:   122 kg   Height:        General: Pt is alert, awake, not in acute distress Cardiovascular: RRR, S1/S2 +, no rubs, no gallops Respiratory: CTA bilaterally, no wheezing, no rhonchi, [place] 3L Abdominal: Soft, NT, ND, bowel sounds + Extremities: Chronic stasis dermatitis changes with persistent edema    The results of significant diagnostics from this hospitalization (including imaging, microbiology, ancillary and laboratory) are listed below for reference.     Microbiology: Recent Results (from the past 240 hour(s))  MRSA Next Gen by PCR, Nasal     Status: None   Collection Time: 09/27/22  3:20 PM   Specimen: Nasal Mucosa; Nasal Swab  Result Value Ref Range Status   MRSA by PCR Next Gen NOT DETECTED NOT DETECTED Final    Comment: (NOTE) The GeneXpert MRSA Assay (FDA approved for NASAL specimens only), is one component of a comprehensive MRSA colonization surveillance program. It is not intended to diagnose MRSA infection nor to guide or monitor treatment for MRSA infections. Test performance is not FDA approved in patients less than 84 years old. Performed at [facility], [address] [place], [place] [address]      Labs: BNP (last 3 results) Recent Labs    07/05/22 0127 09/27/22 1204 09/28/22 0459   BNP 32.0 28.0 53.0   Basic Metabolic Panel: Recent Labs  Lab 09/30/22 0449 10/01/22 0409 10/02/22 0502 10/03/22 0507 10/04/22 0538  NA 135 136 136 136 134*  K 4.0 3.8 3.8 3.5 3.8  CL 87* 84* 82* 81* 80*  CO2 38* 40* 44* 41* 40*  GLUCOSE 170* 131* 125* 146* 160*  BUN 48* 55* 61* 70* 78*  CREATININE 1.25* 1.23 1.30* 1.56* 1.67*  CALCIUM 9.0 9.0 9.0 8.9 9.0  MG 2.1 2.2 2.1 2.1 2.1   Liver Function Tests: No results for input(s): "AST", "ALT", "ALKPHOS", "BILITOT", "PROT", "ALBUMIN" in the last 168 hours. No results for input(s): "LIPASE", "AMYLASE" in the last 168 hours. No results for input(s): "AMMONIA" in the last 168 hours. CBC: Recent Labs  Lab 09/28/22 0459 09/29/22 0458 09/30/22 0449 10/01/22 0409 10/02/22 0502  WBC 6.0 9.0 8.9 9.7 10.3  NEUTROABS 5.0 7.2 7.3 7.5  --   HGB 14.4 14.5 15.9 15.8 17.4*  HCT 49.1 48.5 51.9 51.3 55.8*  MCV 105.4* 104.3* 103.0* 101.8* 101.1*  PLT 178 194 203 193 187   Cardiac Enzymes: No results for input(s): "CKTOTAL", "CKMB", "CKMBINDEX", "TROPONINI" in the last 168 hours. BNP: Invalid input(s): "POCBNP" CBG: No results for input(s): "GLUCAP" in the last 168 hours. D-Dimer No results for input(s): "DDIMER" in the last 72 hours. Hgb A1c No results for input(s): "HGBA1C" in the last 72 hours. Lipid Profile No results for input(s): "CHOL", "HDL", "LDLCALC", "TRIG", "CHOLHDL", "LDLDIRECT" in the last 72 hours. Thyroid function studies No results for input(s): "TSH", "T4TOTAL", "T3FREE", "THYROIDAB" in the last 72 hours.  Invalid input(s): "FREET3" Anemia work up No results for input(s): "VITAMINB12", "FOLATE", "FERRITIN", "TIBC", "IRON", "RETICCTPCT" in the last 72 hours. Urinalysis    Component Value Date/Time   COLORURINE YELLOW 09/29/2022 0950   APPEARANCEUR CLEAR 09/29/2022 0950   APPEARANCEUR Clear 09/22/2022 1131   LABSPEC 1.015 09/29/2022 0950   PHURINE 5.0 09/29/2022 0950   GLUCOSEU NEGATIVE 09/29/2022 0950    HGBUR NEGATIVE 09/29/2022 0950   BILIRUBINUR NEGATIVE 09/29/2022 0950   BILIRUBINUR Negative 09/22/2022 1131   KETONESUR NEGATIVE 09/29/2022 0950   PROTEINUR NEGATIVE 09/29/2022 0950   NITRITE NEGATIVE 09/29/2022 0950   LEUKOCYTESUR NEGATIVE 09/29/2022 0950   Sepsis Labs Recent Labs  Lab 09/29/22 0458 09/30/22 0449 10/01/22 0409 10/02/22 0502  WBC 9.0 8.9 9.7 10.3   Microbiology Recent Results (from the past 240 hour(s))  MRSA Next Gen by PCR, Nasal     Status: None   Collection Time: 09/27/22  3:20 PM   Specimen: Nasal Mucosa; Nasal Swab  Result Value Ref Range Status   MRSA by PCR Next Gen NOT DETECTED NOT DETECTED Final    Comment: (NOTE) The GeneXpert MRSA Assay (FDA approved for NASAL specimens only), is one component of a comprehensive MRSA colonization surveillance program. It is not intended to diagnose MRSA infection nor to guide or monitor treatment for MRSA infections. Test performance is not FDA approved in patients less than 82 years old. Performed at [facility], [address], [place], [place] [address]      Time coordinating discharge: 35 minutes  SIGNED:   Erick Blinks, DO [facility] 10/04/2022, 7:02 AM  If 7PM-7AM, please contact night-coverage [email or web address]

## 2022-10-03 LAB — BASIC METABOLIC PANEL
Anion gap: 14 (ref 5–15)
BUN: 70 mg/dL — ABNORMAL HIGH (ref 8–23)
CO2: 41 mmol/L — ABNORMAL HIGH (ref 22–32)
Calcium: 8.9 mg/dL (ref 8.9–10.3)
Chloride: 81 mmol/L — ABNORMAL LOW (ref 98–111)
Creatinine, Ser: 1.56 mg/dL — ABNORMAL HIGH (ref 0.61–1.24)
GFR, Estimated: 44 mL/min — ABNORMAL LOW (ref >60)
Glucose, Bld: 146 mg/dL — ABNORMAL HIGH (ref 70–99)
Potassium: 3.5 mmol/L (ref 3.5–5.1)
Sodium: 136 mmol/L (ref 135–145)

## 2022-10-03 LAB — MAGNESIUM: Magnesium: 2.1 mg/dL (ref 1.7–2.4)

## 2022-10-03 NOTE — TOC Progression Note (Signed)
Transition of Care [facility]) - Progression Note    Patient Details  Name: Vernon Campbell MRN: [id number] Date of Birth: 06/28/1940  [facility]) CM/SW Contact  Catalina [place], [place] Phone Number: 10/03/2022, 4:14 PM  Clinical Narrative:     CSW visited pt in room, he was istting in his chair.  Pt agreed that he is heading to  to [facility] tomorrow. No other TOC needs at this time.   Expected Discharge Plan: Long Term Nursing Home Barriers to Discharge: Other (must enter comment) (SNF can accept Monday)  Expected Discharge Plan and Services         Expected Discharge Date: 10/02/22                                     Social Determinants of Health (SDOH) Interventions SDOH Screenings   Food Insecurity: No Food Insecurity (09/27/2022)  Housing: Low Risk  (09/27/2022)  Transportation Needs: No Transportation Needs (09/27/2022)  Utilities: Not At Risk (09/27/2022)  Social Connections: Unknown (07/31/2021)   Received from [facility]  Tobacco Use: Medium Risk (09/28/2022)    Readmission Risk Interventions    09/28/2022    2:05 PM 12/14/2021   12:46 PM  Readmission Risk Prevention Plan  Transportation Screening Complete Complete  PCP or Specialist Appt within 3-5 Days Complete   HRI or Home Care Consult Complete Complete  Social Work Consult for Recovery Care Planning/Counseling Complete Complete  Palliative Care Screening Not Applicable Not Applicable  Medication Review Oceanographer) Complete Complete

## 2022-10-03 NOTE — Progress Notes (Signed)
Patient seen and evaluated this a.m. with no acute overnight events or concerns noted.  Laboratory data unremarkable, continue to follow with oral diuresis.  Please refer to discharge summary dictated 7/13 for full details.  He currently remains hospitalized as his facility cannot take him back until 7/15.  Total care time: 15 minutes.

## 2022-10-04 LAB — BASIC METABOLIC PANEL
Anion gap: 14 (ref 5–15)
BUN: 78 mg/dL — ABNORMAL HIGH (ref 8–23)
CO2: 40 mmol/L — ABNORMAL HIGH (ref 22–32)
Calcium: 9 mg/dL (ref 8.9–10.3)
Chloride: 80 mmol/L — ABNORMAL LOW (ref 98–111)
Creatinine, Ser: 1.67 mg/dL — ABNORMAL HIGH (ref 0.61–1.24)
GFR, Estimated: 40 mL/min — ABNORMAL LOW (ref >60)
Glucose, Bld: 160 mg/dL — ABNORMAL HIGH (ref 70–99)
Potassium: 3.8 mmol/L (ref 3.5–5.1)
Sodium: 134 mmol/L — ABNORMAL LOW (ref 135–145)

## 2022-10-04 LAB — MAGNESIUM: Magnesium: 2.1 mg/dL (ref 1.7–2.4)

## 2022-10-04 MED ORDER — TORSEMIDE 40 MG PO TABS: 40.0000 mg | ORAL_TABLET | ORAL | 2 refills | Status: DC

## 2022-10-04 MED ORDER — SPIRONOLACTONE 50 MG PO TABS: 50.0000 mg | ORAL_TABLET | Freq: Every day | ORAL | 0 refills | Status: DC

## 2022-10-04 NOTE — Plan of Care (Signed)

## 2022-10-04 NOTE — Progress Notes (Signed)
Nsg Discharge Note  Admit Date:  09/27/2022 Discharge date: 10/04/2022   Remer Macho to be D/C'd Skilled nursing facility per MD order.  AVS completed. Patient/caregiver able to verbalize understanding.  Discharge Medication: Allergies as of 10/04/2022   No Known Allergies      Medication List     STOP taking these medications    sulfamethoxazole-trimethoprim 800-160 MG tablet Commonly known as: BACTRIM DS       TAKE these medications    albuterol (2.5 MG/3ML) 0.083% nebulizer solution Commonly known as: PROVENTIL Take 3 mLs (2.5 mg total) by nebulization every 2 (two) hours as needed for shortness of breath.   atorvastatin 20 MG tablet Commonly known as: LIPITOR Take 20 mg by mouth every evening.   bacitracin 500 UNIT/GM ointment Apply 1 Application topically 2 (two) times daily.   doxycycline 100 MG tablet Commonly known as: VIBRA-TABS Take 1 tablet (100 mg total) by mouth every 12 (twelve) hours for 4 days.   fluticasone furoate-vilanterol 100-25 MCG/ACT Aepb Commonly known as: BREO ELLIPTA Inhale 1 puff into the lungs daily.   HYDROcodone-acetaminophen 5-325 MG tablet Commonly known as: NORCO/VICODIN Take 1 tablet by mouth every 8 (eight) hours as needed for moderate pain.   ketoconazole 2 % shampoo Commonly known as: NIZORAL Apply 1 application  topically 2 (two) times a week. Apply to scalp (shampoo) topically during day shift every Tuesday and Friday for tinea versicolor (bath days)   lactulose 10 GM/15ML solution Commonly known as: CHRONULAC Take 15 mLs by mouth daily as needed (constipation).   levothyroxine 175 MCG tablet Commonly known as: SYNTHROID Take 175 mcg by mouth daily before breakfast.   OXYGEN Inhale 2 L/min into the lungs continuous.   polyethylene glycol 17 g packet Commonly known as: MIRALAX / GLYCOLAX Take 17 g by mouth daily.   potassium chloride SA 20 MEQ tablet Commonly known as: KLOR-CON M Take 20 mEq by mouth daily.    pramipexole 1 MG tablet Commonly known as: MIRAPEX Take 1 tablet (1 mg total) by mouth at bedtime.   senna 8.6 MG Tabs tablet Commonly known as: SENOKOT Take 17.2 mg by mouth at bedtime.   spironolactone 50 MG tablet Commonly known as: ALDACTONE Take 1 tablet (50 mg total) by mouth daily. Start taking on: October 06, 2022 What changed: These instructions start on October 06, 2022. If you are unsure what to do until then, ask your doctor or other care provider.   tamsulosin 0.4 MG Caps capsule Commonly known as: FLOMAX Take 1 capsule (0.4 mg total) by mouth daily after supper. What changed: when to take this   Torsemide 40 MG Tabs Take 40 mg by mouth See admin instructions. Take 60 mg every morning and 40 mg every evening Start taking on: October 06, 2022 What changed:  These instructions start on October 06, 2022. If you are unsure what to do until then, ask your doctor or other care provider. Another medication with the same name was removed. Continue taking this medication, and follow the directions you see here.        Discharge Assessment: Vitals:   10/04/22 0638 10/04/22 0740  BP: (!) 103/59   Pulse: 89   Resp:    Temp: 97.7 F (36.5 C)   SpO2: 95% 96%   Skin clean, dry and intact without evidence of skin break down, no evidence of skin tears noted. IV catheter discontinued intact. Site without signs and symptoms of complications - no redness or edema noted  at insertion site, patient denies c/o pain - only slight tenderness at site.  Dressing with slight pressure applied.  D/c Instructions-Education: Discharge instructions given to patient/family with verbalized understanding. D/c education completed with patient/family including follow up instructions, medication list, d/c activities limitations if indicated, with other d/c instructions as indicated by MD - patient able to verbalize understanding, all questions fully answered. Patient instructed to return to ED, call 911, or  call MD for any changes in condition.  Patient escorted via WC, and D/C home via private auto.  Laurena Spies, RN 10/04/2022 11:08 AM

## 2022-10-04 NOTE — TOC Transition Note (Addendum)
Transition of Care [facility]) - CM/SW Discharge Note   Patient Details  Name: Vernon Campbell MRN: [id number] Date of Birth: 09-22-1940  Transition of Care [facility]) CM/SW Contact:  Leitha Bleak, RN Phone Number: 10/04/2022, 10:33 AM   Clinical Narrative:   Patient discharging back to [facility], Son updated, Herbert Seta provided a room number. [facility] transport scheduled. Documents sent in the hub. Palliative is recommending Palliative in house program at [facility] [place] updated and will add to his services there.    Final next level of care: Long Term Acute Care (LTAC) Barriers to Discharge: Barriers Resolved   Patient Goals and CMS Choice CMS Medicare.gov Compare Post Acute Care list provided to:: Patient Represenative (must comment)   Discharge Placement                Patient to be transferred to facility by: [facility] Name of family member notified: Son Patient and family notified of of transfer: 10/04/22  Discharge Plan and Services Additional resources added to the After Visit Summary for        Social Determinants of Health (SDOH) Interventions SDOH Screenings   Food Insecurity: No Food Insecurity (09/27/2022)  Housing: Low Risk  (09/27/2022)  Transportation Needs: No Transportation Needs (09/27/2022)  Utilities: Not At Risk (09/27/2022)  Social Connections: Unknown (07/31/2021)   Received from [facility], [facility]  Tobacco Use: Medium Risk (09/28/2022)    Readmission Risk Interventions    09/28/2022    2:05 PM 12/14/2021   12:46 PM  Readmission Risk Prevention Plan  Transportation Screening Complete Complete  PCP or Specialist Appt within 3-5 Days Complete   HRI or Home Care Consult Complete Complete  Social Work Consult for Recovery Care Planning/Counseling Complete Complete  Palliative Care Screening Not Applicable Not Applicable  Medication Review Oceanographer) Complete Complete

## 2022-10-04 NOTE — Progress Notes (Signed)
Patient seen and evaluated this a.m. with no acute overnight events noted.  He is overall feeling quite well.  Plan will be to hold oral diuretics for the next 2 days and may be resumed on the 17th.  Recheck BMP in the next 3-5 days to ensure creatinine is progressing towards baseline.  He is stable for discharge back to his facility today.  Please refer to discharge summary dictated 7/13 for full details.  Total care time: 15 minutes.

## 2022-10-04 NOTE — Progress Notes (Signed)
Mobility Specialist Progress Note:    10/04/22 1015  Mobility  Activity Repositioned in chair  Level of Assistance Independent  Assistive Device None  Range of Motion/Exercises Active;Right arm;Left arm  Activity Response Tolerated well  Mobility Referral Yes  $Mobility charge 1 Mobility  Mobility Specialist Start Time (ACUTE ONLY) 1015  Mobility Specialist Stop Time (ACUTE ONLY) 1020  Mobility Specialist Time Calculation (min) (ACUTE ONLY) 5 min   Pt received in chair, deferred transfers/ ambulation d/t nausea. Pt able to reposition in chair independently, no AD required. Tolerated well, asx throughout. Left pt in chair, all needs met, call bell in reach, eager for discharge.   Feliciana Rossetti Mobility Specialist Please contact via Special educational needs teacher or  Rehab office at [phone]

## 2022-10-04 NOTE — Care Management Important Message (Signed)
Important Message  Patient Details  Name: Vernon Campbell MRN: [id number] Date of Birth: 1940-12-14   Medicare Important Message Given:  Yes     [name] 10/04/2022, 11:02 AM

## 2022-10-05 ENCOUNTER — Ambulatory Visit: Payer: Medicare Other | Admitting: Urology

## 2022-10-14 ENCOUNTER — Ambulatory Visit (INDEPENDENT_AMBULATORY_CARE_PROVIDER_SITE_OTHER): Payer: Medicare Other | Admitting: Urology

## 2022-10-14 ENCOUNTER — Encounter: Payer: Self-pay | Admitting: Urology

## 2022-10-14 VITALS — BP 100/64 | HR 75 | Temp 97.4°F

## 2022-10-14 DIAGNOSIS — N4889 Other specified disorders of penis: Secondary | ICD-10-CM | POA: Diagnosis not present

## 2022-10-14 DIAGNOSIS — N5089 Other specified disorders of the male genital organs: Secondary | ICD-10-CM

## 2022-10-14 DIAGNOSIS — N400 Enlarged prostate without lower urinary tract symptoms: Secondary | ICD-10-CM

## 2022-10-14 DIAGNOSIS — N492 Inflammatory disorders of scrotum: Secondary | ICD-10-CM

## 2022-10-14 DIAGNOSIS — R350 Frequency of micturition: Secondary | ICD-10-CM

## 2022-10-14 DIAGNOSIS — N401 Enlarged prostate with lower urinary tract symptoms: Secondary | ICD-10-CM

## 2022-10-14 DIAGNOSIS — N4822 Cellulitis of corpus cavernosum and penis: Secondary | ICD-10-CM

## 2022-10-14 LAB — URINALYSIS, ROUTINE W REFLEX MICROSCOPIC
Bilirubin, UA: NEGATIVE
Glucose, UA: NEGATIVE
Ketones, UA: NEGATIVE
Nitrite, UA: NEGATIVE
Protein,UA: NEGATIVE
RBC, UA: NEGATIVE
Specific Gravity, UA: 1.01 (ref 1.005–1.030)
Urobilinogen, Ur: 1 mg/dL (ref 0.2–1.0)
pH, UA: 6.5 (ref 5.0–7.5)

## 2022-10-14 LAB — MICROSCOPIC EXAMINATION: Bacteria, UA: NONE SEEN

## 2022-10-14 NOTE — Progress Notes (Signed)
Name: Vernon Campbell DOB: March 21, 1941 MRN: [id number]  History of Present Illness: Vernon Campbell is a 82 y.o. male who presents today for follow up visit at [facility] [place]. He is accompanied by Tillie Fantasia (transport staff from [facility]). - GU history: 1. BPH with LUTS (frequency, nocturia, urgency). - Taking Flomax.  - Exacerbated by diuretic use for CHF management with bilateral lower extremity lymphedema. Uses compression therapy.  At last visit with 09/22/2022: Seen for penoscrotal cellulitis. Treated with Bactrim DS 2x/day for 7 days and advised to elevate scrotum when sedentary.  Since last visit: Admitted 09/27/2022 - 10/04/2022 for CHF exacerbation. Antibiotic switched to Doxycycline for penoscrotal cellulitis.  Today: He reports doing well. He reports full resolution of penile / scrotal swelling. Denies redness, warmth, tenderness, swelling, or discharge. He denies fevers. He denies any acute urinary complaints.   Fall Screening: Do you usually have a device to assist in your mobility? Yes - wheelchair   Medications: Current Outpatient Medications  Medication Sig Dispense Refill   albuterol (PROVENTIL) (2.5 MG/3ML) 0.083% nebulizer solution Take 3 mLs (2.5 mg total) by nebulization every 2 (two) hours as needed for shortness of breath. 75 mL 12   atorvastatin (LIPITOR) 20 MG tablet Take 20 mg by mouth every evening.     bacitracin 500 UNIT/GM ointment Apply 1 Application topically 2 (two) times daily.     fluticasone furoate-vilanterol (BREO ELLIPTA) 100-25 MCG/ACT AEPB Inhale 1 puff into the lungs daily. 28 each 3   HYDROcodone-acetaminophen (NORCO/VICODIN) 5-325 MG tablet Take 1 tablet by mouth every 8 (eight) hours as needed for moderate pain. 5 tablet 0   ketoconazole (NIZORAL) 2 % shampoo Apply 1 application  topically 2 (two) times a week. Apply to scalp (shampoo) topically during day shift every Tuesday and Friday for tinea versicolor (bath days)      lactulose (CHRONULAC) 10 GM/15ML solution Take 15 mLs by mouth daily as needed (constipation).     levothyroxine (SYNTHROID) 175 MCG tablet Take 175 mcg by mouth daily before breakfast.     OXYGEN Inhale 2 L/min into the lungs continuous.     polyethylene glycol (MIRALAX / GLYCOLAX) 17 g packet Take 17 g by mouth daily.     potassium chloride SA (KLOR-CON) 20 MEQ tablet Take 20 mEq by mouth daily.     pramipexole (MIRAPEX) 1 MG tablet Take 1 tablet (1 mg total) by mouth at bedtime. 10 tablet 0   senna (SENOKOT) 8.6 MG TABS tablet Take 17.2 mg by mouth at bedtime.     spironolactone (ALDACTONE) 50 MG tablet Take 1 tablet (50 mg total) by mouth daily. 30 tablet 0   tamsulosin (FLOMAX) 0.4 MG CAPS capsule Take 1 capsule (0.4 mg total) by mouth daily after supper. (Patient taking differently: Take 0.4 mg by mouth every evening.) 30 capsule 11   Torsemide 40 MG TABS Take 40 mg by mouth See admin instructions. Take 60 mg every morning and 40 mg every evening 120 tablet 2   No current facility-administered medications for this visit.    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)    Age-related physical debility    Aortic stenosis    Carpal tunnel syndrome, right    Chronic cor pulmonale (HCC)    Chronic diastolic heart failure (HCC)    Chronic respiratory failure with  hypoxia (HCC)    Combined forms of age-related cataract, bilateral    Constipation, chronic    Dermatochalasis of eyelid    Extreme obesity with alveolar hypoventilation (HCC)    History of tobacco use    Hyperlipidemia    Hypermetropia, bilateral    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 No pertinent surgical history. History reviewed. No pertinent family history. Social History   Socioeconomic History   Marital status: Single    Spouse name: Not on file   Number of children: Not on file   Years of education: Not on file   Highest education level: Not on file  Occupational History   Not on file  Tobacco Use   Smoking status: Former    Types: Cigarettes   Smokeless tobacco: Never  Vaping Use   Vaping status: Never Used  Substance and Sexual Activity   Alcohol use: Not Currently   Drug use: Not Currently   Sexual activity: Not Currently  Other Topics Concern   Not on file  Social History Narrative   Not on file   Social Determinants of Health   Financial Resource Strain: Not on file  Food Insecurity: No Food Insecurity (09/27/2022)   Hunger Vital Sign    Worried About Running Out of Food in the Last Year: Never true    Ran Out of Food in the Last Year: Never true  Transportation Needs: No Transportation Needs (09/27/2022)   PRAPARE - Administrator, Civil Service (Medical): No    Lack of Transportation (Non-Medical): No  Physical Activity: Not on file  Stress: Not on file  Social Connections: Unknown (07/31/2021)   Received from [facility]   Social Network    Social Network: Not on file  Intimate Partner Violence: Not At Risk (09/27/2022)   Humiliation, Afraid, Rape, and Kick questionnaire    Fear of Current or Ex-Partner: No    Emotionally Abused: No    Physically Abused: No    Sexually Abused: No    Review of Systems Constitutional: Patient denies any  change in strength lntegumentary:  Patient denies any rashes or pruritus Cardiovascular: Patient denies chest pain or syncope Respiratory: Patient denies shortness of breath Gastrointestinal: Patient denies nausea, vomiting, constipation, or diarrhea Musculoskeletal: Patient denies muscle cramps or weakness Neurologic: Patient denies convulsions or seizures Psychiatric: Patient denies memory problems Allergic/Immunologic: Patient denies recent allergic reaction(s) Hematologic/Lymphatic: Patient denies bleeding tendencies Endocrine: Patient denies heat/cold intolerance  GU: As per HPI.  OBJECTIVE Vitals:   10/14/22 1044  BP: 100/64  Pulse: 75  Temp: (!) 97.4 F (36.3 C)   There is no height or weight on file to calculate BMI.  Physical Examination  Constitutional: No obvious distress; patient is non-toxic appearing  Cardiovascular: No visible lower extremity edema.  Respiratory: The patient does not have audible wheezing/stridor; respirations do not appear  labored  Gastrointestinal: Abdomen non-distended Musculoskeletal: Normal ROM of UEs  Skin: No obvious rashes/open sores; BLE wrapped in ACE wrap compression dressings Neurologic: CN 2-12 grossly intact Psychiatric: Answered questions appropriately with normal affect  Hematologic/Lymphatic/Immunologic: No obvious bruises or sites of spontaneous bleeding  Genitourinary: Penis is normal in appearance.  Scrotum is normal in appearance. Testes are normal in size and position bilaterally with no palpable masses.   Pelvic exam was chaperoned by S. Chestine Spore, CMA.  UA: negative    ASSESSMENT Scrotal swelling - Plan: Urinalysis, Routine w reflex microscopic, CANCELED: BLADDER SCAN AMB NON-IMAGING  Penile swelling - Plan: Urinalysis, Routine w reflex microscopic, CANCELED: BLADDER SCAN AMB NON-IMAGING  Penis, cellulitis - Plan: Urinalysis, Routine w reflex microscopic, CANCELED: BLADDER SCAN AMB NON-IMAGING  Benign prostatic hyperplasia with urinary frequency -  Plan: Urinalysis, Routine w reflex microscopic, CANCELED: BLADDER SCAN AMB NON-IMAGING  Cellulitis of scrotum  Scrotal edema  BPH without urinary obstruction  He is doing well. Agreed to continue Flomax 0.4 mg daily and will plan for follow up in 3 months or sooner if needed. Pt verbalized understanding and agreement. All questions were answered.  PLAN Advised the following: 1. Continue Flomax 0.4 mg daily. 2. Return in about 3 months (around 01/14/2023) for UA, PVR, & f/u with Evette Georges NP.  Orders Placed This Encounter  Procedures   Urinalysis, Routine w reflex microscopic   Total time spent caring for the patient today was over 30 minutes. This includes time spent on the date of the visit reviewing the patient's chart before the visit, time spent during the visit, and time spent after the visit on documentation. Over 50% of that time was spent in face-to-face time with this patient for direct counseling. E&M based on time and complexity of medical decision making.  It has been explained that the patient is to follow regularly with their PCP in addition to all other providers involved in their care and to follow instructions provided by these respective offices. Patient advised to contact urology clinic if any urologic-pertaining questions, concerns, new symptoms or problems arise in the interim period.  There are no Patient Instructions on file for this visit.  Electronically signed by:  Donnita Falls, FNP   10/14/22    12:02 PM

## 2022-10-23 ENCOUNTER — Emergency Department (HOSPITAL_COMMUNITY): Payer: Medicare Other

## 2022-10-23 ENCOUNTER — Inpatient Hospital Stay (HOSPITAL_COMMUNITY): Payer: Medicare Other

## 2022-10-23 ENCOUNTER — Other Ambulatory Visit: Payer: Self-pay

## 2022-10-23 ENCOUNTER — Inpatient Hospital Stay (HOSPITAL_COMMUNITY)
Admission: EM | Admit: 2022-10-23 | Discharge: 2022-10-28 | DRG: 603 | Disposition: A | Discharge status: Skilled Nursing Facility | Payer: Medicare Other | Attending: Internal Medicine | Admitting: Internal Medicine

## 2022-10-23 ENCOUNTER — Encounter (HOSPITAL_COMMUNITY): Payer: Self-pay

## 2022-10-23 DIAGNOSIS — I2729 Other secondary pulmonary hypertension: Secondary | ICD-10-CM | POA: Diagnosis present

## 2022-10-23 DIAGNOSIS — G8929 Other chronic pain: Secondary | ICD-10-CM | POA: Diagnosis present

## 2022-10-23 DIAGNOSIS — Z6841 Body Mass Index (BMI) 40.0 and over, adult: Secondary | ICD-10-CM | POA: Diagnosis not present

## 2022-10-23 DIAGNOSIS — L03114 Cellulitis of left upper limb: Secondary | ICD-10-CM | POA: Diagnosis present

## 2022-10-23 DIAGNOSIS — E871 Hypo-osmolality and hyponatremia: Secondary | ICD-10-CM | POA: Diagnosis present

## 2022-10-23 DIAGNOSIS — I480 Paroxysmal atrial fibrillation: Secondary | ICD-10-CM | POA: Diagnosis present

## 2022-10-23 DIAGNOSIS — L03115 Cellulitis of right lower limb: Secondary | ICD-10-CM | POA: Diagnosis present

## 2022-10-23 DIAGNOSIS — Z87891 Personal history of nicotine dependence: Secondary | ICD-10-CM

## 2022-10-23 DIAGNOSIS — Z79890 Hormone replacement therapy (postmenopausal): Secondary | ICD-10-CM

## 2022-10-23 DIAGNOSIS — Z96651 Presence of right artificial knee joint: Secondary | ICD-10-CM | POA: Diagnosis present

## 2022-10-23 DIAGNOSIS — N400 Enlarged prostate without lower urinary tract symptoms: Secondary | ICD-10-CM | POA: Diagnosis present

## 2022-10-23 DIAGNOSIS — I872 Venous insufficiency (chronic) (peripheral): Secondary | ICD-10-CM | POA: Diagnosis present

## 2022-10-23 DIAGNOSIS — L0390 Cellulitis, unspecified: Secondary | ICD-10-CM | POA: Diagnosis present

## 2022-10-23 DIAGNOSIS — I5033 Acute on chronic diastolic (congestive) heart failure: Secondary | ICD-10-CM

## 2022-10-23 DIAGNOSIS — J9611 Chronic respiratory failure with hypoxia: Secondary | ICD-10-CM | POA: Diagnosis present

## 2022-10-23 DIAGNOSIS — J449 Chronic obstructive pulmonary disease, unspecified: Secondary | ICD-10-CM | POA: Diagnosis present

## 2022-10-23 DIAGNOSIS — G2581 Restless legs syndrome: Secondary | ICD-10-CM | POA: Diagnosis present

## 2022-10-23 DIAGNOSIS — E785 Hyperlipidemia, unspecified: Secondary | ICD-10-CM | POA: Diagnosis present

## 2022-10-23 DIAGNOSIS — K219 Gastro-esophageal reflux disease without esophagitis: Secondary | ICD-10-CM | POA: Diagnosis present

## 2022-10-23 DIAGNOSIS — N492 Inflammatory disorders of scrotum: Secondary | ICD-10-CM | POA: Diagnosis present

## 2022-10-23 DIAGNOSIS — I5032 Chronic diastolic (congestive) heart failure: Secondary | ICD-10-CM | POA: Diagnosis present

## 2022-10-23 DIAGNOSIS — I110 Hypertensive heart disease with heart failure: Secondary | ICD-10-CM | POA: Diagnosis present

## 2022-10-23 DIAGNOSIS — R5381 Other malaise: Secondary | ICD-10-CM | POA: Diagnosis present

## 2022-10-23 DIAGNOSIS — I350 Nonrheumatic aortic (valve) stenosis: Secondary | ICD-10-CM

## 2022-10-23 DIAGNOSIS — I2781 Cor pulmonale (chronic): Secondary | ICD-10-CM | POA: Diagnosis present

## 2022-10-23 DIAGNOSIS — I509 Heart failure, unspecified: Secondary | ICD-10-CM

## 2022-10-23 DIAGNOSIS — E662 Morbid (severe) obesity with alveolar hypoventilation: Secondary | ICD-10-CM | POA: Diagnosis present

## 2022-10-23 DIAGNOSIS — Z9981 Dependence on supplemental oxygen: Secondary | ICD-10-CM

## 2022-10-23 DIAGNOSIS — H25813 Combined forms of age-related cataract, bilateral: Secondary | ICD-10-CM | POA: Diagnosis present

## 2022-10-23 DIAGNOSIS — I060 Rheumatic aortic stenosis: Secondary | ICD-10-CM | POA: Diagnosis present

## 2022-10-23 DIAGNOSIS — Z7951 Long term (current) use of inhaled steroids: Secondary | ICD-10-CM

## 2022-10-23 DIAGNOSIS — E039 Hypothyroidism, unspecified: Secondary | ICD-10-CM | POA: Diagnosis present

## 2022-10-23 DIAGNOSIS — I2510 Atherosclerotic heart disease of native coronary artery without angina pectoris: Secondary | ICD-10-CM | POA: Diagnosis present

## 2022-10-23 DIAGNOSIS — Z79899 Other long term (current) drug therapy: Secondary | ICD-10-CM

## 2022-10-23 DIAGNOSIS — G4733 Obstructive sleep apnea (adult) (pediatric): Secondary | ICD-10-CM | POA: Diagnosis present

## 2022-10-23 DIAGNOSIS — E66813 Obesity, class 3: Secondary | ICD-10-CM | POA: Diagnosis present

## 2022-10-23 DIAGNOSIS — Z89421 Acquired absence of other right toe(s): Secondary | ICD-10-CM

## 2022-10-23 DIAGNOSIS — I10 Essential (primary) hypertension: Secondary | ICD-10-CM | POA: Diagnosis present

## 2022-10-23 DIAGNOSIS — Z66 Do not resuscitate: Secondary | ICD-10-CM | POA: Diagnosis present

## 2022-10-23 LAB — COMPREHENSIVE METABOLIC PANEL
ALT: 20 U/L (ref 0–44)
AST: 20 U/L (ref 15–41)
Albumin: 3.2 g/dL — ABNORMAL LOW (ref 3.5–5.0)
Alkaline Phosphatase: 158 U/L — ABNORMAL HIGH (ref 38–126)
Anion gap: 8 (ref 5–15)
BUN: 32 mg/dL — ABNORMAL HIGH (ref 8–23)
CO2: 31 mmol/L (ref 22–32)
Calcium: 8.7 mg/dL — ABNORMAL LOW (ref 8.9–10.3)
Chloride: 92 mmol/L — ABNORMAL LOW (ref 98–111)
Creatinine, Ser: 1.17 mg/dL (ref 0.61–1.24)
GFR, Estimated: >60 mL/min (ref >60)
Glucose, Bld: 120 mg/dL — ABNORMAL HIGH (ref 70–99)
Potassium: 4.2 mmol/L (ref 3.5–5.1)
Sodium: 131 mmol/L — ABNORMAL LOW (ref 135–145)
Total Bilirubin: 2.9 mg/dL — ABNORMAL HIGH (ref 0.3–1.2)
Total Protein: 7.1 g/dL (ref 6.5–8.1)

## 2022-10-23 LAB — CBC WITH DIFFERENTIAL/PLATELET
Abs Immature Granulocytes: 0.39 10*3/uL — ABNORMAL HIGH (ref 0.00–0.07)
Basophils Absolute: 0.1 10*3/uL (ref 0.0–0.1)
Basophils Relative: 0 %
Eosinophils Absolute: 0.1 10*3/uL (ref 0.0–0.5)
Eosinophils Relative: 0 %
HCT: 51.8 % (ref 39.0–52.0)
Hemoglobin: 16.4 g/dL (ref 13.0–17.0)
Immature Granulocytes: 2 %
Lymphocytes Relative: 4 %
Lymphs Abs: 0.8 10*3/uL (ref 0.7–4.0)
MCH: 31.5 pg (ref 26.0–34.0)
MCHC: 31.7 g/dL (ref 30.0–36.0)
MCV: 99.6 fL (ref 80.0–100.0)
Monocytes Absolute: 1.3 10*3/uL — ABNORMAL HIGH (ref 0.1–1.0)
Monocytes Relative: 6 %
Neutro Abs: 18.5 10*3/uL — ABNORMAL HIGH (ref 1.7–7.7)
Neutrophils Relative %: 88 %
Platelets: 209 10*3/uL (ref 150–400)
RBC: 5.2 MIL/uL (ref 4.22–5.81)
RDW: 15.2 % (ref 11.5–15.5)
WBC: 21.1 10*3/uL — ABNORMAL HIGH (ref 4.0–10.5)
nRBC: 0 % (ref 0.0–0.2)

## 2022-10-23 LAB — LACTIC ACID, PLASMA
Lactic Acid, Venous: 1.2 mmol/L (ref 0.5–1.9)
Lactic Acid, Venous: 0.9 mmol/L (ref 0.5–1.9)

## 2022-10-23 LAB — URINALYSIS, ROUTINE W REFLEX MICROSCOPIC
Bilirubin Urine: NEGATIVE
Glucose, UA: NEGATIVE mg/dL
Hgb urine dipstick: NEGATIVE
Ketones, ur: NEGATIVE mg/dL
Leukocytes,Ua: NEGATIVE
Nitrite: NEGATIVE
Protein, ur: NEGATIVE mg/dL
Specific Gravity, Urine: 1.009 (ref 1.005–1.030)
pH: 7 (ref 5.0–8.0)

## 2022-10-23 LAB — BRAIN NATRIURETIC PEPTIDE: B Natriuretic Peptide: 93 pg/mL (ref 0.0–100.0)

## 2022-10-23 MED ORDER — ZINC OXIDE 40 % EX OINT
TOPICAL_OINTMENT | Freq: Three times a day (TID) | CUTANEOUS | Status: DC
  Filled 2022-10-23: qty 57

## 2022-10-23 MED ORDER — ENOXAPARIN SODIUM 60 MG/0.6ML IJ SOSY
60.0000 mg | PREFILLED_SYRINGE | INTRAMUSCULAR | Status: DC
  Administered 2022-10-24 – 2022-10-27 (×4): 60 mg via SUBCUTANEOUS
  Filled 2022-10-23 (×4): qty 0.6

## 2022-10-23 MED ORDER — MORPHINE SULFATE (PF) 4 MG/ML IV SOLN
4.0000 mg | Freq: Once | INTRAVENOUS | Status: AC
  Administered 2022-10-23: 4 mg via INTRAVENOUS
  Filled 2022-10-23: qty 1

## 2022-10-23 MED ORDER — ONDANSETRON HCL 4 MG PO TABS: 4.0000 mg | ORAL_TABLET | Freq: Four times a day (QID) | ORAL | Status: DC | PRN

## 2022-10-23 MED ORDER — ALBUTEROL SULFATE (2.5 MG/3ML) 0.083% IN NEBU: 2.5000 mg | INHALATION_SOLUTION | Freq: Four times a day (QID) | RESPIRATORY_TRACT | Status: DC

## 2022-10-23 MED ORDER — TORSEMIDE 20 MG PO TABS
40.0000 mg | ORAL_TABLET | Freq: Every day | ORAL | Status: DC
  Administered 2022-10-24 – 2022-10-25 (×2): 40 mg via ORAL
  Filled 2022-10-23 (×2): qty 2

## 2022-10-23 MED ORDER — NYSTATIN 100000 UNIT/GM EX POWD
Freq: Three times a day (TID) | CUTANEOUS | Status: DC
  Filled 2022-10-23 (×3): qty 15

## 2022-10-23 MED ORDER — POTASSIUM CHLORIDE CRYS ER 20 MEQ PO TBCR
20.0000 meq | EXTENDED_RELEASE_TABLET | Freq: Every day | ORAL | Status: DC
  Administered 2022-10-24 – 2022-10-28 (×5): 20 meq via ORAL
  Filled 2022-10-23 (×5): qty 1

## 2022-10-23 MED ORDER — IPRATROPIUM-ALBUTEROL 0.5-2.5 (3) MG/3ML IN SOLN
3.0000 mL | Freq: Four times a day (QID) | RESPIRATORY_TRACT | Status: DC
  Administered 2022-10-24: 3 mL via RESPIRATORY_TRACT
  Filled 2022-10-23: qty 3

## 2022-10-23 MED ORDER — VANCOMYCIN HCL IN DEXTROSE 1-5 GM/200ML-% IV SOLN: 1000.0000 mg | Freq: Once | INTRAVENOUS | Status: DC

## 2022-10-23 MED ORDER — SODIUM CHLORIDE 0.9 % IV SOLN
2.0000 g | Freq: Once | INTRAVENOUS | Status: AC
  Administered 2022-10-23: 2 g via INTRAVENOUS
  Filled 2022-10-23: qty 12.5

## 2022-10-23 MED ORDER — TORSEMIDE 20 MG PO TABS
60.0000 mg | ORAL_TABLET | Freq: Every day | ORAL | Status: DC
  Administered 2022-10-24 – 2022-10-25 (×2): 60 mg via ORAL
  Filled 2022-10-23 (×2): qty 3

## 2022-10-23 MED ORDER — TAMSULOSIN HCL 0.4 MG PO CAPS
0.4000 mg | ORAL_CAPSULE | Freq: Every day | ORAL | Status: DC
  Administered 2022-10-24 – 2022-10-27 (×4): 0.4 mg via ORAL
  Filled 2022-10-23 (×4): qty 1

## 2022-10-23 MED ORDER — ONDANSETRON HCL 4 MG/2ML IJ SOLN
4.0000 mg | Freq: Four times a day (QID) | INTRAMUSCULAR | Status: DC | PRN
  Administered 2022-10-24: 4 mg via INTRAVENOUS
  Filled 2022-10-23: qty 2

## 2022-10-23 MED ORDER — PRAMIPEXOLE DIHYDROCHLORIDE 1 MG PO TABS
1.0000 mg | ORAL_TABLET | Freq: Every day | ORAL | Status: DC
  Administered 2022-10-23 – 2022-10-27 (×5): 1 mg via ORAL
  Filled 2022-10-23 (×5): qty 1

## 2022-10-23 MED ORDER — VANCOMYCIN HCL 1250 MG/250ML IV SOLN
1250.0000 mg | INTRAVENOUS | Status: DC
  Administered 2022-10-24 – 2022-10-25 (×2): 1250 mg via INTRAVENOUS
  Filled 2022-10-23 (×2): qty 250

## 2022-10-23 MED ORDER — SPIRONOLACTONE 25 MG PO TABS
50.0000 mg | ORAL_TABLET | Freq: Every day | ORAL | Status: DC
  Administered 2022-10-24 – 2022-10-25 (×2): 50 mg via ORAL
  Filled 2022-10-23 (×2): qty 2

## 2022-10-23 MED ORDER — HYDROCODONE-ACETAMINOPHEN 5-325 MG PO TABS
1.0000 | ORAL_TABLET | Freq: Three times a day (TID) | ORAL | Status: DC | PRN
  Administered 2022-10-23 – 2022-10-28 (×13): 1 via ORAL
  Filled 2022-10-23 (×14): qty 1

## 2022-10-23 MED ORDER — HYDRALAZINE HCL 20 MG/ML IJ SOLN: 5.0000 mg | INTRAMUSCULAR | Status: DC | PRN

## 2022-10-23 MED ORDER — ALBUTEROL SULFATE (2.5 MG/3ML) 0.083% IN NEBU
2.5000 mg | INHALATION_SOLUTION | RESPIRATORY_TRACT | Status: DC | PRN
  Administered 2022-10-26: 2.5 mg via RESPIRATORY_TRACT
  Filled 2022-10-23: qty 3

## 2022-10-23 MED ORDER — ACETAMINOPHEN 650 MG RE SUPP: 650.0000 mg | Freq: Four times a day (QID) | RECTAL | Status: DC | PRN

## 2022-10-23 MED ORDER — POLYETHYLENE GLYCOL 3350 17 G PO PACK
17.0000 g | PACK | Freq: Every day | ORAL | Status: DC
  Administered 2022-10-26 – 2022-10-28 (×3): 17 g via ORAL
  Filled 2022-10-23 (×5): qty 1

## 2022-10-23 MED ORDER — VANCOMYCIN HCL 2000 MG/400ML IV SOLN
2000.0000 mg | Freq: Once | INTRAVENOUS | Status: AC
  Administered 2022-10-23: 2000 mg via INTRAVENOUS
  Filled 2022-10-23: qty 400

## 2022-10-23 MED ORDER — ACETAMINOPHEN 325 MG PO TABS
650.0000 mg | ORAL_TABLET | Freq: Four times a day (QID) | ORAL | Status: DC | PRN
  Administered 2022-10-27: 650 mg via ORAL
  Filled 2022-10-23: qty 2

## 2022-10-23 MED ORDER — IPRATROPIUM BROMIDE 0.02 % IN SOLN: 0.5000 mg | Freq: Four times a day (QID) | RESPIRATORY_TRACT | Status: DC

## 2022-10-23 MED ORDER — FLUTICASONE FUROATE-VILANTEROL 100-25 MCG/ACT IN AEPB
1.0000 | INHALATION_SPRAY | Freq: Every day | RESPIRATORY_TRACT | Status: DC
  Administered 2022-10-24 – 2022-10-28 (×5): 1 via RESPIRATORY_TRACT
  Filled 2022-10-23: qty 28

## 2022-10-23 MED ORDER — SENNA 8.6 MG PO TABS
17.2000 mg | ORAL_TABLET | Freq: Two times a day (BID) | ORAL | Status: DC
  Administered 2022-10-23 – 2022-10-28 (×9): 17.2 mg via ORAL
  Filled 2022-10-23 (×9): qty 2

## 2022-10-23 MED ORDER — LACTULOSE 10 GM/15ML PO SOLN
10.0000 g | Freq: Every day | ORAL | Status: DC
  Administered 2022-10-24 – 2022-10-28 (×5): 10 g via ORAL
  Filled 2022-10-23 (×5): qty 30

## 2022-10-23 MED ORDER — ATORVASTATIN CALCIUM 20 MG PO TABS
20.0000 mg | ORAL_TABLET | Freq: Every evening | ORAL | Status: DC
  Administered 2022-10-23 – 2022-10-27 (×5): 20 mg via ORAL
  Filled 2022-10-23 (×5): qty 1

## 2022-10-23 MED ORDER — LEVOTHYROXINE SODIUM 75 MCG PO TABS
175.0000 ug | ORAL_TABLET | Freq: Every day | ORAL | Status: DC
  Administered 2022-10-24 – 2022-10-28 (×5): 175 ug via ORAL
  Filled 2022-10-23 (×5): qty 1

## 2022-10-23 MED ORDER — SODIUM CHLORIDE 0.9 % IV SOLN
2.0000 g | Freq: Three times a day (TID) | INTRAVENOUS | Status: DC
  Administered 2022-10-23 – 2022-10-26 (×8): 2 g via INTRAVENOUS
  Filled 2022-10-23 (×8): qty 12.5

## 2022-10-23 NOTE — Consult Note (Signed)
WOC Nurse Consult Note: Reason for Consult:RLE draining blisters (ruptured). Consult is completed remotely after review of EMR including photodocumentation provided  by EDP of the RLE, scrotal area. Pharmacy has been consulted for systemic antibiotic therapy. Wound type:infectious Pressure Injury POA: N/A Measurement: To be measured by Beside RN with application of dressings Wound bed:red, moist Drainage (amount, consistency, odor) serous, large Periwound:erythematous, edematous Dressing procedure/placement/frequency: I have provided a mattress replacement with low air los feature to assist with microclimate management. Turning and repositioning is in place; I have added to minimize time in the supine position. A sacral foam is to be placed for PI prevention and Prevalon boots are provided bilaterally for heel PI prevention. Topical care to the ruptured and intact serum filled blisters will be with daily and PRN cleansing with NS, patting dry and covering the lesions with antimicrobial nonadherent (xeroform) gauze topped with ABD pads and secured with a few turns of Kerlix roll gauze/paper tape.  WOC nursing team will not follow, but will remain available to this patient, the nursing and medical teams.  Please re-consult if needed.  Thank you for inviting [place] to participate in this patient's Plan of Care.  Ladona Mow, MSN, RN, CNS, GNP, Leda Min, [employer], [employer] phone:  [phone]

## 2022-10-23 NOTE — ED Notes (Signed)
Due to Pts body habitus, Pt unable to complete MRI Scan. MRI returned Pt to treatment room, reporting Pt unwilling to attempt to repeat attempts to complete the scan. EDP Notified.

## 2022-10-23 NOTE — Progress Notes (Signed)
Pharmacy Antibiotic Note  Vernon Campbell is a 82 y.o. male for which pharmacy has been consulted for cefepime and vancomycin dosing for cellulitis.  Patient with a history of HF, COPD, CAD, HTN, obesity-hypoventilation syndrome, HLD, hypothyroidism, OSA, RLS. Patient presenting with left hand pain.  SCr 1.17 WBC 21.1; LA 0.9; T 98.4; HR 97; RR 20  Plan: Cefepime 2g q8hr  Vancomycin 2000 mg once then 1250 mg q24hr (eAUC 451.7) unless change in renal function Monitor WBC, fever, renal function, cultures De-escalate when able Levels at steady state  Height: 5\' 9"  (175.3 cm) Weight: 127.5 kg (281 lb) IBW/kg (Calculated) : 70.7  Temp (24hrs), Avg:98.2 F (36.8 C), Min:97.9 F (36.6 C), Max:98.4 F (36.9 C)  Recent Labs  Lab 10/23/22 1409 10/23/22 1543  WBC 21.1*  --   CREATININE 1.17  --   LATICACIDVEN 1.2 0.9    Estimated Creatinine Clearance: 62.1 mL/min (by C-G formula based on SCr of 1.17 mg/dL).    No Known Allergies  Microbiology results: Pending  Thank you for allowing pharmacy to be a part of this patient's care.  Delmar Landau, PharmD, BCPS 10/23/2022 8:16 PM ED Clinical Pharmacist -  [phone]

## 2022-10-23 NOTE — ED Triage Notes (Signed)
Pt reports left wrist and hand pain x 3 days, denies any injury.

## 2022-10-23 NOTE — H&P (Signed)
TRH H&P   Patient Demographics:    Vernon Campbell, is a 82 y.o. male  MRN: [id number]   DOB - May 03, 1940  Admit Date - 10/23/2022  Outpatient Primary MD for the patient is System, Provider Not In  Referring MD/NP/PA: PA Sofia  Patient coming from: [facility]  Chief Complaint  Patient presents with   Hand Pain      HPI:    Vernon Campbell  is a 82 y.o. male, with medical history significant for HFpEF, COPD c/b chronic respiratory failure with hypoxia (on baseline 3L [place]), CAD, HTN, obesity-hypoventilation syndrome, HLD, hypothyroidism, OSA and restless leg syndrome with recent hospitalization last month for acute on chronic diastolic CHF, worsening respiratory failure, and COPD exacerbation, and scrotal cellulitis, patient was discharged back to his facility. -Patient was sent by his facility due to complaints of right lower extremity pain, concern for cellulitis, as well left hand pain, patient reports he had an x-ray at the facility which was negative, but patient still reports pain, worsening edema, redness, and both right lower extremity and left hand, at facility he had leg wrap yesterday, but he had significant draining today through the wrap -In ED patient was noted to have significant leukocytosis of 21 K, he was unable to tolerate MRI of the left hand, blood cultures were sent, he was started on broad-spectrum IV antibiotics, and [facility] hospitalist consulted to admit for cellulitis..   Review of systems:      A full 10 point Review of Systems was done, except as stated above, all other Review of Systems were negative.   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)    Age-related physical debility    Aortic stenosis    Carpal tunnel syndrome,  right    Chronic cor pulmonale (HCC)    Chronic diastolic heart failure (HCC)    Chronic respiratory failure with hypoxia (HCC)    Combined forms of age-related cataract, bilateral    Constipation, chronic    Dermatochalasis of eyelid    Extreme obesity with alveolar hypoventilation (HCC)    History of tobacco use    Hyperlipidemia    Hypermetropia, bilateral    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 No pertinent surgical history.    Social History:     Social History   Tobacco Use   Smoking status: Former    Types: Cigarettes   Smokeless tobacco: Never  Substance Use Topics   Alcohol use: Not Currently      Family History :    History reviewed. No pertinent family history.    Home Medications:   Prior to Admission medications   Medication Sig Start Date End Date Taking? Authorizing Provider  albuterol (PROVENTIL) (2.5 MG/3ML) 0.083% nebulizer solution Take 3 mLs (2.5 mg total) by nebulization every 2 (two) hours as needed for shortness of breath. 12/15/21  Yes Emokpae, Courage, MD  atorvastatin (LIPITOR) 20 MG tablet Take 20 mg by mouth every evening.   Yes [provider]  bacitracin 500 UNIT/GM ointment Apply 1 Application topically 2 (two) times daily.   Yes [provider]  fluticasone furoate-vilanterol (BREO ELLIPTA) 100-25 MCG/ACT AEPB Inhale 1 puff into the lungs daily. 12/16/21  Yes Emokpae,  Courage, MD  HYDROcodone-acetaminophen (NORCO/VICODIN) 5-325 MG tablet Take 1 tablet by mouth every 8 (eight) hours as needed for moderate pain. 10/02/22  Yes Shah, Pratik D, DO  ketoconazole (NIZORAL) 2 % shampoo Apply 1 application  topically 2 (two) times a week. Apply to scalp (shampoo) topically during day shift every Tuesday and Friday for tinea versicolor (bath days)   Yes [provider]  lactulose (CHRONULAC) 10 GM/15ML solution Take 15 mLs by mouth daily.   Yes [provider]  levothyroxine (SYNTHROID) 175 MCG tablet Take 175 mcg by mouth daily before breakfast.   Yes [provider]  OXYGEN Inhale 2 L/min into the lungs continuous.   Yes [provider]  polyethylene glycol (MIRALAX / GLYCOLAX) 17 g packet Take 17 g by mouth daily.   Yes [provider]  potassium chloride SA (KLOR-CON) 20 MEQ tablet Take 20 mEq by mouth daily.   Yes [provider]  pramipexole (MIRAPEX) 1 MG tablet Take 1 tablet (1 mg total) by mouth at bedtime. 07/07/22  Yes Sherryll Burger, Pratik D, DO  senna (SENOKOT) 8.6 MG TABS tablet Take 17.2 mg by mouth 2 (two) times daily.   Yes [provider]  spironolactone (ALDACTONE) 50 MG tablet Take 1 tablet (50 mg total) by mouth daily. 10/06/22 11/05/22 Yes Shah, Pratik D, DO  tamsulosin (FLOMAX) 0.4 MG CAPS capsule Take 1 capsule (0.4 mg total) by mouth daily after supper. Patient taking differently: Take 0.4 mg by mouth every evening. 09/06/22  Yes Donnita Falls, FNP  Torsemide 40 MG TABS Take 40 mg by mouth See admin instructions. Take 60 mg every morning and 40 mg every evening 10/06/22  Yes Shah, Pratik D, DO     Allergies:    No Known Allergies   Physical Exam:   Vitals  Blood pressure 106/65, pulse 97, temperature 98.4 F (36.9 C), temperature source Oral, resp. rate 20, height 5\' 9"  (1.753 m), weight 127.5 kg, SpO2 94%.   1. General elderly male, laying in bed, appears uncomfortable.  2. Normal  affect and insight, Not Suicidal or Homicidal, Awake Alert, Oriented X 3.  3. No F.N deficits, ALL C.Nerves Intact, Strength 5/5 all 4 extremities, Sensation intact all 4 extremities, Plantars down going.  4. Ears and Eyes appear Normal, Conjunctivae clear, PERRLA. Moist Oral Mucosa.  5. Supple Neck, No JVD, No  cervical lymphadenopathy appriciated, No Carotid Bruits.  6. Symmetrical Chest wall movement, Good air movement bilaterally, CTAB.  7. RRR, No Gallops, Rubs or Murmurs, No Parasternal Heave.  8. Positive Bowel Sounds, Abdomen Soft, No tenderness, No organomegaly appriciated,No rebound -guarding or rigidity.  9.  R extremity erythema, swelling and blisters, please see picture below, as well right hand/wrist significant erythema and swelling, please see pictures below          Data Review:    CBC Recent Labs  Lab 10/23/22 1409  WBC 21.1*  HGB 16.4  HCT 51.8  PLT 209  MCV 99.6  MCH 31.5  MCHC 31.7  RDW 15.2  LYMPHSABS 0.8  MONOABS 1.3*  EOSABS 0.1  BASOSABS 0.1   ------------------------------------------------------------------------------------------------------------------  Chemistries  Recent Labs  Lab 10/23/22 1409  NA 131*  K 4.2  CL 92*  CO2 31  GLUCOSE 120*  BUN 32*  CREATININE 1.17  CALCIUM 8.7*  AST 20  ALT 20  ALKPHOS 158*  BILITOT 2.9*   ------------------------------------------------------------------------------------------------------------------ estimated creatinine clearance is 62.1 mL/min (by C-G formula based on SCr of 1.17 mg/dL). ------------------------------------------------------------------------------------------------------------------ No results for input(s): "TSH", "T4TOTAL", "T3FREE", "THYROIDAB" in the last 72 hours.  Invalid input(s): "FREET3"  Coagulation profile No results for input(s): "INR", "PROTIME" in the last 168  hours. ------------------------------------------------------------------------------------------------------------------- No results for input(s): "DDIMER" in the last 72 hours. -------------------------------------------------------------------------------------------------------------------  Cardiac Enzymes No results for input(s): "CKMB", "TROPONINI", "MYOGLOBIN" in the last 168 hours.  Invalid input(s): "CK" ------------------------------------------------------------------------------------------------------------------    Component Value Date/Time   BNP 53.0 09/28/2022 0459     ---------------------------------------------------------------------------------------------------------------  Urinalysis    Component Value Date/Time   COLORURINE YELLOW 10/23/2022 1330   APPEARANCEUR CLEAR 10/23/2022 1330   APPEARANCEUR Clear 10/14/2022 1019   LABSPEC 1.009 10/23/2022 1330   PHURINE 7.0 10/23/2022 1330   GLUCOSEU NEGATIVE 10/23/2022 1330   HGBUR NEGATIVE 10/23/2022 1330   BILIRUBINUR NEGATIVE 10/23/2022 1330   BILIRUBINUR Negative 10/14/2022 1019   KETONESUR NEGATIVE 10/23/2022 1330   PROTEINUR NEGATIVE 10/23/2022 1330   NITRITE NEGATIVE 10/23/2022 1330   LEUKOCYTESUR NEGATIVE 10/23/2022 1330    ----------------------------------------------------------------------------------------------------------------   Imaging Results:    No results found.  My personal review of EKG: Pending   Assessment & Plan:    Principal Problem:   Cellulitis Active Problems:   Chronic respiratory failure with hypoxia (HCC)   Hyperlipidemia   Hypothyroidism   Obesity, Class III, BMI 40-49.9 (morbid obesity) (HCC)   Chronic stasis dermatitis   COPD (chronic obstructive pulmonary disease) (HCC)   CHF (congestive heart failure) (HCC)   Coronary artery disease involving native coronary artery   Debility   Esophageal reflux   Essential hypertension   Obesity hypoventilation syndrome  (HCC)   OSA (obstructive sleep apnea)   Chronic knee pain after total replacement of right knee joint   BPH without urinary obstruction   Mod to Severe Aortic stenosis   Cellulitis of scrotum  Cellulitis Scrotal cellulitis, left hand, right lower extremity Underlying chronic venous stasis dermatitis  -Patient presents with significant right lower extremity erythema, tenderness blistering, with underlying stasis disease-as well presents with significant left hand pain . -Unfortunately patient unable tolerate MRI of the left hand, x-ray at the facility with no acute findings, swelling significantly subsided, if he fails to improve and further imaging is needed then likely will proceed with CT imaging. -admitted under cellulitis pathway, continue with IV cefepime and vancomycin -Follow on blood cultures -Wound care to be consulted getting some blisters on  right lower extremity -Keep left hand elevated  Chronic diastolic CHF -Continue with home dose torsemide and Aldactone  Chronic hypoxic respiratory failure -He is on oxygen at baseline, will check x-ray during this admission, and BNP  COPD  -No active wheezing, continue with Breo Ellipta, scheduled DuoNebs and as needed albutero   Restless Leg Syndrome - Continue Norco-Vicodin 5-325mg  - Continue Pramipixole 1mg    CAD -Patient with aspirin and statin  Hyperlipidemia -continue with pravastatin  Hyponatremia -Continue with home diuresis  Hypothyroidism - Continue Levothyroxine   BPH - Flomax 0.4mg      DVT Prophylaxis Lovenox  AM Labs Ordered, also please review Full Orders  Family Communication: Admission, patients condition and plan of care including tests being ordered have been discussed with the patient and unable to reach son by phone who indicate understanding and agree with the plan and Code Status.  Code Status DNR, informed by patient, as well as DNR form and ACP section  Likely DC to back to  SNF  Condition GUARDED  Consults called: None  Admission status: Inpatient  Time spent in minutes : 70 minutes   Huey Bienenstock M.D on 10/23/2022 at 8:16 PM   [facility] - Office  [phone]

## 2022-10-23 NOTE — ED Notes (Signed)
Patient transported to MRI 

## 2022-10-23 NOTE — ED Provider Notes (Signed)
[facility] DE[facility]MENT AT [facility] Provider Note   CSN: [id number] Arrival date & time: 10/23/22  1251     History  Chief Complaint  Patient presents with   Hand Pain    Vernon Campbell is a 82 y.o. male.  Patient complains of pain in his left hand.  Patient reports he had x-rays of his hand that were negative patient complains of swelling.  Patient also has redness and swelling in his right leg.  Patient reports his leg was wrapped yesterday.  However today he had a wound open and start draining.  The history is provided by the patient. No language interpreter was used.  Hand Pain This is a new problem. The current episode started more than 2 days ago. The problem occurs constantly. Pertinent negatives include no shortness of breath.       Home Medications Prior to Admission medications   Medication Sig Start Date End Date Taking? Authorizing Provider  albuterol (PROVENTIL) (2.5 MG/3ML) 0.083% nebulizer solution Take 3 mLs (2.5 mg total) by nebulization every 2 (two) hours as needed for shortness of breath. 12/15/21   Shon Hale, MD  atorvastatin (LIPITOR) 20 MG tablet Take 20 mg by mouth every evening.    [provider]  bacitracin 500 UNIT/GM ointment Apply 1 Application topically 2 (two) times daily.    [provider]  fluticasone furoate-vilanterol (BREO ELLIPTA) 100-25 MCG/ACT AEPB Inhale 1 puff into the lungs daily. 12/16/21   Shon Hale, MD  HYDROcodone-acetaminophen (NORCO/VICODIN) 5-325 MG tablet Take 1 tablet by mouth every 8 (eight) hours as needed for moderate pain. 10/02/22   Sherryll Burger, Pratik D, DO  ketoconazole (NIZORAL) 2 % shampoo Apply 1 application  topically 2 (two) times a week. Apply to scalp (shampoo) topically during day shift every Tuesday and Friday for tinea versicolor (bath days)    [provider]  lactulose (CHRONULAC) 10 GM/15ML solution Take 15 mLs by mouth daily as needed (constipation).    [provider]  levothyroxine (SYNTHROID) 175 MCG tablet Take 175 mcg by mouth daily before breakfast.    [provider]  OXYGEN Inhale 2 L/min into the lungs continuous.    [provider]  polyethylene glycol (MIRALAX / GLYCOLAX) 17 g packet Take 17 g by mouth daily.    [provider]  potassium chloride SA (KLOR-CON) 20 MEQ tablet Take 20 mEq by mouth daily.    [provider]  pramipexole (MIRAPEX) 1 MG tablet Take 1 tablet (1 mg total) by mouth at bedtime. 07/07/22   Sherryll Burger, Pratik D, DO  senna (SENOKOT) 8.6 MG TABS tablet Take 17.2 mg by mouth at bedtime.    [provider]  spironolactone (ALDACTONE) 50 MG tablet Take 1 tablet (50 mg total) by mouth daily. 10/06/22 11/05/22  Sherryll Burger, Pratik D, DO  tamsulosin (FLOMAX) 0.4 MG CAPS capsule Take 1 capsule (0.4 mg total) by mouth daily after supper. Patient taking differently: Take 0.4 mg by mouth every evening. 09/06/22   Donnita Falls, FNP  Torsemide 40 MG TABS Take 40 mg by mouth See admin instructions. Take 60 mg every morning and 40 mg every evening 10/06/22   Maurilio Lovely D, DO      Allergies    Patient has no known allergies.    Review of Systems   Review of Systems  Constitutional:  Positive for fever.  Respiratory:  Negative for shortness of breath.   Musculoskeletal:  Positive for arthralgias and myalgias.  Skin:  Positive for color change.  All other systems reviewed and are negative.   Physical Exam Updated Vital Signs Temp 97.9 F (36.6 C) (Oral)   Ht 5\' 9"  (1.753 m)   Wt 127.5 kg   BMI 41.50 kg/m  Physical Exam Vitals and nursing note reviewed.  Constitutional:      Appearance: He is well-developed.  HENT:     Head: Normocephalic.  Cardiovascular:     Rate and Rhythm: Normal rate.  Pulmonary:     Effort: Pulmonary effort is normal.  Abdominal:     General: There is no distension.  Musculoskeletal:        General: Swelling and tenderness present.     Cervical back:  Normal range of motion.     Comments: Right leg erythematous swelling oozing lower leg, left hand swollen painful to touch neurovascular neurosensory intact  Skin:    General: Skin is warm.  Neurological:     General: No focal deficit present.     Mental Status: He is alert and oriented to person, place, and time.     ED Results / Procedures / Treatments   Labs (all labs ordered are listed, but only abnormal results are displayed) Labs Reviewed  CBC WITH DIFFERENTIAL/PLATELET - Abnormal; Notable for the following components:      Result Value   WBC 21.1 (*)    Neutro Abs 18.5 (*)    Monocytes Absolute 1.3 (*)    Abs Immature Granulocytes 0.39 (*)    All other components within normal limits  COMPREHENSIVE METABOLIC PANEL - Abnormal; Notable for the following components:   Sodium 131 (*)    Chloride 92 (*)    Glucose, Bld 120 (*)    BUN 32 (*)    Calcium 8.7 (*)    Albumin 3.2 (*)    Alkaline Phosphatase 158 (*)    Total Bilirubin 2.9 (*)    All other components within normal limits  CULTURE, BLOOD (ROUTINE X 2)  CULTURE, BLOOD (ROUTINE X 2)  LACTIC ACID, PLASMA  LACTIC ACID, PLASMA    EKG None  Radiology No results found.  Procedures Procedures    Medications Ordered in ED Medications  morphine (PF) 4 MG/ML injection 4 mg (has no administration in time range)  vancomycin (VANCOCIN) IVPB 1000 mg/200 mL premix (has no administration in time range)  ceFEPIme (MAXIPIME) 2 g in sodium chloride 0.9 % 100 mL IVPB (has no administration in time range)    ED Course/ Medical Decision Making/ A&P Clinical Course as of 10/23/22 1440  Sat Oct 23, 2022  1437 Lactic Acid, Venous: 1.2 [NF]    Clinical Course User Index [NF] Rejeana Brock, [place]                                 Medical Decision Making Patient complains of redness and pain to his left hand, patient complains of redness and drainage from his right leg.  Amount and/or Complexity of Data  Reviewed External Data Reviewed: notes.    Details: Hospitalist notes reviewed from previous admission Labs: ordered. Decision-making details documented in ED Course.    Details: CBC shows an elevated white blood cell count of 21. Discussion of management or test interpretation with external provider(s): Hospitalist consulted for admission  Risk Prescription drug management. Decision regarding hospitalization. Risk Details: Patient given IV antibiotics for cellulitis.  Morphine for pain.  Final Clinical Impression(s) / ED Diagnoses Final diagnoses:  Cellulitis of left hand  Cellulitis of right leg    Rx / DC Orders ED Discharge Orders     None         Osie Cheeks 10/23/22 2106    Vanetta Mulders, MD 10/24/22 [phone]

## 2022-10-24 ENCOUNTER — Inpatient Hospital Stay (HOSPITAL_COMMUNITY): Payer: Medicare Other

## 2022-10-24 DIAGNOSIS — L03114 Cellulitis of left upper limb: Secondary | ICD-10-CM

## 2022-10-24 DIAGNOSIS — E039 Hypothyroidism, unspecified: Secondary | ICD-10-CM

## 2022-10-24 DIAGNOSIS — J9611 Chronic respiratory failure with hypoxia: Secondary | ICD-10-CM | POA: Diagnosis not present

## 2022-10-24 DIAGNOSIS — L03115 Cellulitis of right lower limb: Secondary | ICD-10-CM | POA: Diagnosis not present

## 2022-10-24 LAB — BASIC METABOLIC PANEL WITH GFR
Anion gap: 11 (ref 5–15)
BUN: 29 mg/dL — ABNORMAL HIGH (ref 8–23)
CO2: 30 mmol/L (ref 22–32)
Calcium: 8.6 mg/dL — ABNORMAL LOW (ref 8.9–10.3)
Chloride: 92 mmol/L — ABNORMAL LOW (ref 98–111)
Creatinine, Ser: 1.13 mg/dL (ref 0.61–1.24)
GFR, Estimated: >60 mL/min (ref >60)
Glucose, Bld: 134 mg/dL — ABNORMAL HIGH (ref 70–99)
Potassium: 3.8 mmol/L (ref 3.5–5.1)
Sodium: 133 mmol/L — ABNORMAL LOW (ref 135–145)

## 2022-10-24 LAB — CBC
HCT: 46.4 % (ref 39.0–52.0)
Hemoglobin: 14.4 g/dL (ref 13.0–17.0)
MCH: 31.3 pg (ref 26.0–34.0)
MCHC: 31 g/dL (ref 30.0–36.0)
MCV: 100.9 fL — ABNORMAL HIGH (ref 80.0–100.0)
Platelets: 217 10*3/uL (ref 150–400)
RBC: 4.6 MIL/uL (ref 4.22–5.81)
RDW: 15.1 % (ref 11.5–15.5)
WBC: 21.7 10*3/uL — ABNORMAL HIGH (ref 4.0–10.5)
nRBC: 0 % (ref 0.0–0.2)

## 2022-10-24 MED ORDER — IPRATROPIUM-ALBUTEROL 0.5-2.5 (3) MG/3ML IN SOLN
3.0000 mL | Freq: Two times a day (BID) | RESPIRATORY_TRACT | Status: DC
  Administered 2022-10-24 – 2022-10-28 (×9): 3 mL via RESPIRATORY_TRACT
  Filled 2022-10-24 (×9): qty 3

## 2022-10-24 MED ORDER — IOHEXOL 300 MG/ML  SOLN
100.0000 mL | Freq: Once | INTRAMUSCULAR | Status: AC | PRN
  Administered 2022-10-24: 100 mL via INTRAVENOUS

## 2022-10-24 NOTE — Progress Notes (Addendum)
Pts RLE 3+ pitting edema. Pt has two opened areas on RLE that look like they blistered and popped open. Weeping is present. MD made aware. Pt tolerated ambulating to chair with minimal assist with walker. Awaiting CT results.   Per MD refer to [facility] for dressing changes

## 2022-10-24 NOTE — TOC Initial Note (Signed)
Transition of Care [facility]) - Initial/Assessment Note    Patient Details  Name: Vernon Campbell MRN: [id number] Date of Birth: Jul 08, 1940  [facility]) CM/SW Contact:    Leitha Bleak, RN Phone Number: 10/24/2022, 3:35 PM  Clinical Narrative:                 Patient admitted with cellulitis. Recent admission and assessments.  Patient is a long term resident at [facility]. He has been there 2 1/2 years and plans to return. Last admission TOC consulted with [facility] to start the in house palliative program. TOC following.             Expected Discharge Plan: Long Term Acute Care (LTAC) Barriers to Discharge: Continued Medical Work up   Patient Goals and CMS Choice Patient states their goals for this hospitalization and ongoing recovery are:: return to [facility]         Expected Discharge Plan and Services           Prior Living Arrangements/Services   Lives with:: Facility Resident          Activities of Daily Living Home Assistive Devices/Equipment: Eyeglasses ADL Screening (condition at time of admission) Patient's cognitive ability adequate to safely complete daily activities?: Yes Is the patient deaf or have difficulty hearing?: No Does the patient have difficulty seeing, even when wearing glasses/contacts?: No Does the patient have difficulty concentrating, remembering, or making decisions?: No Patient able to express need for assistance with ADLs?: Yes Does the patient have difficulty dressing or bathing?: Yes Independently performs ADLs?: No Does the patient have difficulty walking or climbing stairs?: Yes Weakness of Legs: Both Weakness of Arms/Hands: Left  Permission Sought/Granted                  Emotional Assessment              Admission diagnosis:  Cellulitis [L03.90] Cellulitis of left hand [L03.114] Cellulitis of right leg [L03.115] Patient Active Problem List   Diagnosis Date Noted   Cellulitis 10/23/2022   Acute  heart failure with preserved ejection fraction (HCC) 09/27/2022   Mod to Severe Aortic stenosis 09/27/2022   Cellulitis of scrotum 09/27/2022   Scrotal edema 09/27/2022   CHF (congestive heart failure) (HCC) 07/05/2022   COPD (chronic obstructive pulmonary disease) (HCC) 12/11/2021   Hypokalemia 08/20/2021   Restless leg syndrome 08/18/2021   Chronic respiratory failure with hypoxia (HCC) 08/18/2021   Hyperlipidemia 10/01/2020   Hypothyroidism 10/01/2020   Macrocytic anemia 10/01/2020   Obesity, Class III, BMI 40-49.9 (morbid obesity) (HCC) 10/01/2020   Chronic stasis dermatitis 10/01/2020   Right carpal tunnel syndrome 12/19/2018   Mild pulmonary hypertension (HCC) 04/15/2018   Debility 09/23/2017   Obesity hypoventilation syndrome (HCC) 03/06/2017   Chronic knee pain after total replacement of right knee joint 11/09/2016   Coronary artery disease involving native coronary artery 10/29/2015   MGUS (monoclonal gammopathy of unknown significance) 08/15/2015   Osteoarthritis of right hip 01/19/2013   DJD (degenerative joint disease) of knee 09/18/2012   OSA (obstructive sleep apnea) 01/26/2012   Esophageal reflux 05/27/2009   Essential hypertension 08/08/2008   BPH without urinary obstruction 04/24/2007   PCP:  System, Provider Not In Pharmacy:   [facility] - [place], [place] - [address] [place] [address] Phone: [phone] Fax: [phone]     Social Determinants of Health (SDOH) Social History: SDOH Screenings   Food Insecurity: No Food  Insecurity (10/23/2022)  Housing: Low Risk  (10/23/2022)  Transportation Needs: No Transportation Needs (10/23/2022)  Utilities: Not At Risk (10/23/2022)  Social Connections: Unknown (07/31/2021)   Received from [facility]  Tobacco Use: Medium Risk (10/23/2022)   SDOH Interventions:     Readmission Risk Interventions    10/24/2022    3:34 PM 09/28/2022    2:05 PM 12/14/2021    12:46 PM  Readmission Risk Prevention Plan  Transportation Screening Complete Complete Complete  PCP or Specialist Appt within 3-5 Days Complete Complete   HRI or Home Care Consult Complete Complete Complete  Social Work Consult for Recovery Care Planning/Counseling Complete Complete Complete  Palliative Care Screening Complete Not Applicable Not Applicable  Medication Review Oceanographer) Complete Complete Complete

## 2022-10-24 NOTE — Progress Notes (Addendum)
[facility] PROGRESS NOTE   Vernon Campbell ZOX:[id number] DOB: March 02, 1941 DOA: 10/23/2022  PCP: System, Provider Not In  Brief History/Interval Summary: 82 y.o. male, with medical history significant for HFpEF, COPD c/b chronic respiratory failure with hypoxia (on baseline 3L [place]), CAD, HTN, obesity-hypoventilation syndrome, HLD, hypothyroidism, OSA and restless leg syndrome with recent hospitalization last month for acute on chronic diastolic CHF, worsening respiratory failure, and COPD exacerbation, and scrotal cellulitis, patient was discharged back to his facility. -Patient was sent by his facility due to complaints of right lower extremity pain, concern for cellulitis, as well left hand pain, patient reports he had an x-ray at the facility which was negative, but patient still reports pain, worsening edema, redness, and both right lower extremity and left hand, at facility he had leg wrap yesterday, but he had significant draining today through the wrap  Consultants: None yet  Procedures: None    Subjective/Interval History: Patient mentions no significant improvement in his right leg and left hand this morning.    Assessment/Plan:  Cellulitis involving multiple sites including right lower extremity, left hand and scrotal area Patient presented with significant right lower extremity erythema with blistering with some drainage from the wound posteriorly. Patient also has swelling of the left hand. Patient was started on vancomycin and cefepime.  Has significant swelling in the right lower extremity.  Will proceed with CT scan of the right lower extremity. The left hand appears to be slightly swollen with some erythema.  He is able to move his fingers.  Has good radial pulses.  Will reassess tomorrow.  Most of the cellulitis appears to be in the dorsal area. Plain films did not show any concerning findings.  MRI of the hand has been ordered.  Could not tolerate yesterday.  Hopefully  will be reattempted today. Follow-up on cultures.  He is afebrile.  His lactic acid level was normal.  WBC remains elevated.  Chronic diastolic CHF Continue with torsemide and Aldactone.  Hypothyroidism Continue levothyroxine.  History of COPD/chronic respiratory failure with hypoxia On oxygen at baseline.  Continue inhalers.  Respiratory status appears to be stable.  Restless leg syndrome Continue home medications.  History of coronary artery disease/hyperlipidemia Continue home medications.  Hyponatremia Slightly better this morning.  BPH Continue Flomax.  Class III obesity Estimated body mass index is 41.49 kg/m as calculated from the following:   Height as of this encounter: 5' 9.02" (1.753 m).   Weight as of this encounter: 127.5 kg.   DVT Prophylaxis: Lovenox Code Status: DNR Family Communication: Discussed with patient.  No family at bedside Disposition Plan: Discharge back to SNF when improved  Status is: Inpatient Remains inpatient appropriate because: Cellulitis involving multiple sites requiring IV antibiotics      Medications: Scheduled:  atorvastatin  20 mg Oral QPM   enoxaparin (LOVENOX) injection  60 mg Subcutaneous Q24H   fluticasone furoate-vilanterol  1 puff Inhalation Daily   ipratropium-albuterol  3 mL Nebulization BID   lactulose  10 g Oral Daily   levothyroxine  175 mcg Oral Q0600   liver oil-zinc oxide   Topical TID   nystatin   Topical TID   polyethylene glycol  17 g Oral Daily   potassium chloride SA  20 mEq Oral Daily   pramipexole  1 mg Oral QHS   senna  17.2 mg Oral BID   spironolactone  50 mg Oral Daily   tamsulosin  0.4 mg Oral QPC supper   torsemide  40 mg Oral  q1800   torsemide  60 mg Oral QAC breakfast   Continuous:  ceFEPime (MAXIPIME) IV 2 g (10/24/22 0617)   vancomycin     ZOX:[id number] **OR** acetaminophen, albuterol, hydrALAZINE, HYDROcodone-acetaminophen, ondansetron **OR** ondansetron (ZOFRAN)  IV  Antibiotics: Anti-infectives (From admission, onward)    Start     Dose/Rate Route Frequency Ordered Stop   10/24/22 1600  vancomycin (VANCOREADY) IVPB 1250 mg/250 mL        1,250 mg 166.7 mL/hr over 90 Minutes Intravenous Every 24 hours 10/23/22 2021 10/30/22 1559   10/23/22 2300  ceFEPIme (MAXIPIME) 2 g in sodium chloride 0.9 % 100 mL IVPB        2 g 200 mL/hr over 30 Minutes Intravenous Every 8 hours 10/23/22 2021 10/30/22 1459   10/23/22 1500  vancomycin (VANCOREADY) IVPB 2000 mg/400 mL        2,000 mg 200 mL/hr over 120 Minutes Intravenous  Once 10/23/22 1447 10/23/22 1930   10/23/22 1445  vancomycin (VANCOCIN) IVPB 1000 mg/200 mL premix  Status:  Discontinued        1,000 mg 200 mL/hr over 60 Minutes Intravenous  Once 10/23/22 1438 10/23/22 1447   10/23/22 1445  ceFEPIme (MAXIPIME) 2 g in sodium chloride 0.9 % 100 mL IVPB        2 g 200 mL/hr over 30 Minutes Intravenous  Once 10/23/22 1438 10/23/22 1533       Objective:  Vital Signs  Vitals:   10/24/22 0542 10/24/22 0720 10/24/22 0724 10/24/22 0814  BP: 133/73   109/72  Pulse: 96   (!) 102  Resp: 20   20  Temp: 98.1 F (36.7 C)   97.9 F (36.6 C)  TempSrc: Oral   Oral  SpO2: 100% 99% 99% 94%  Weight:      Height:        Intake/Output Summary (Last 24 hours) at 10/24/2022 1026 Last data filed at 10/23/2022 1533 Gross per 24 hour  Intake 100 ml  Output --  Net 100 ml   Filed Weights   10/23/22 1304 10/23/22 2049  Weight: 127.5 kg 127.5 kg    General appearance: Awake alert.  In no distress Resp: Clear to auscultation bilaterally.  Normal effort Cardio: S1-S2 is normal regular.  No S3-S4.  No rubs murmurs or bruit GI: Abdomen is soft.  Nontender nondistended.  Bowel sounds are present normal.  No masses organomegaly Extremities: Erythema involving right lower extremity with some drainage from wound posteriorly.  Poorly palpable pulses.  Left hand noted to have swelling mostly on the dorsal side with some  erythema.  No fluctuation identified. Neurologic: Alert and oriented x3.  No focal neurological deficits.    Lab Results:  Data Reviewed: I have personally reviewed following labs and reports of the imaging studies  CBC: Recent Labs  Lab 10/23/22 1409 10/24/22 0525  WBC 21.1* 21.7*  NEUTROABS 18.5*  --   HGB 16.4 14.4  HCT 51.8 46.4  MCV 99.6 100.9*  PLT 209 217    Basic Metabolic Panel: Recent Labs  Lab 10/23/22 1409 10/24/22 0525  NA 131* 133*  K 4.2 3.8  CL 92* 92*  CO2 31 30  GLUCOSE 120* 134*  BUN 32* 29*  CREATININE 1.17 1.13  CALCIUM 8.7* 8.6*    GFR: Estimated Creatinine Clearance: 64.3 mL/min (by C-G formula based on SCr of 1.13 mg/dL).  Liver Function Tests: Recent Labs  Lab 10/23/22 1409  AST 20  ALT 20  ALKPHOS 158*  BILITOT  2.9*  PROT 7.1  ALBUMIN 3.2*     Recent Results (from the past 240 hour(s))  Blood culture (routine x 2)     Status: None (Preliminary result)   Collection Time: 10/23/22  2:09 PM   Specimen: Right Antecubital; Blood  Result Value Ref Range Status   Specimen Description   Final    RIGHT ANTECUBITAL BOTTLES DRAWN AEROBIC AND ANAEROBIC   Special Requests Blood Culture adequate volume  Final   Culture   Final    NO GROWTH < 24 HOURS Performed at [facility], [address], [place], [place] [address]    Report Status PENDING  Incomplete  Blood culture (routine x 2)     Status: None (Preliminary result)   Collection Time: 10/23/22  2:09 PM   Specimen: Left Antecubital; Blood  Result Value Ref Range Status   Specimen Description   Final    LEFT ANTECUBITAL BOTTLES DRAWN AEROBIC AND ANAEROBIC   Special Requests Blood Culture adequate volume  Final   Culture   Final    NO GROWTH < 24 HOURS Performed at [facility], [address] [place], [place] [address]    Report Status PENDING  Incomplete      Radiology Studies: DG Chest Port 1 View  Result Date: 10/23/2022 CLINICAL DATA:  Hypoxia EXAM: PORTABLE CHEST 1  VIEW COMPARISON:  09/29/2022 FINDINGS: Lung volumes are small and pulmonary insufflation has diminished since prior examination. Stable superimposed left basilar consolidation. Possible small left pleural effusion again noted. No pneumothorax. Right lung is clear. Cardiac size is mildly enlarged, stable. Pulmonary vascularity is normal. No acute bone abnormality. IMPRESSION: 1. Interval decrease in pulmonary insufflation. 2. Stable left basilar consolidation.  Small left pleural effusion. 3. Stable mild cardiomegaly. Electronically Signed   By: Helyn Numbers M.D.   On: 10/23/2022 21:24   DG Hand 2 View Left  Result Date: 10/23/2022 CLINICAL DATA:  Left hand cellulitis EXAM: LEFT HAND - 2 VIEW COMPARISON:  None Available. FINDINGS: Widening of the scapholunate interval in keeping with disruption of the scapholunate ligament. Otherwise normal alignment. No acute fracture or dislocation. No osseous erosion or abnormal periosteal reaction. Joint spaces are preserved. Diffuse soft tissue swelling of the visualized left wrist and hand. No subcutaneous gas or retained radiopaque foreign body identified. IMPRESSION: 1. Diffuse soft tissue swelling of the visualized left wrist and hand. No subcutaneous gas or retained radiopaque foreign body identified. 2. Scapholunate ligament disruption. Electronically Signed   By: Helyn Numbers M.D.   On: 10/23/2022 21:22       LOS: 1 day   Osvaldo Shipper  [facility] Pager on [email or web address]  10/24/2022, 10:26 AM

## 2022-10-24 NOTE — Progress Notes (Signed)
Attempted MRI 10/23/2022, Patient body habitus LBH and extensive swelling not allowing for optimum position and scanning. After several attempts at different positions for comfort and safety (Patient was up against bore which needs required pads to avoid burns), as well as different coil choices for him (with swelling and hand pain multiple coil changes to suit him), he then decided he did not want to continue.  Images were subpar at best due to inability to raise arm over head and body habitus/wounds with coil up against bore and body.  We gave the patient the option to take a break, but he did not want to continue.  We informed his care team in the ED.

## 2022-10-25 ENCOUNTER — Inpatient Hospital Stay (HOSPITAL_COMMUNITY): Payer: Medicare Other

## 2022-10-25 DIAGNOSIS — L03115 Cellulitis of right lower limb: Secondary | ICD-10-CM | POA: Diagnosis not present

## 2022-10-25 DIAGNOSIS — E039 Hypothyroidism, unspecified: Secondary | ICD-10-CM | POA: Diagnosis not present

## 2022-10-25 DIAGNOSIS — L03114 Cellulitis of left upper limb: Secondary | ICD-10-CM | POA: Diagnosis not present

## 2022-10-25 DIAGNOSIS — J9611 Chronic respiratory failure with hypoxia: Secondary | ICD-10-CM | POA: Diagnosis not present

## 2022-10-25 MED ORDER — IOHEXOL 300 MG/ML  SOLN
75.0000 mL | Freq: Once | INTRAMUSCULAR | Status: AC | PRN
  Administered 2022-10-25: 75 mL via INTRAVENOUS

## 2022-10-25 NOTE — Progress Notes (Signed)
Patients o2 turned off upon my arrival in room spo2 84% room air o2 back on at 2lpm cann spo2 increased to 96% with treatment nurse informed

## 2022-10-25 NOTE — Plan of Care (Signed)
  Problem: Acute Rehab PT Goals(only PT should resolve) Goal: Pt Will Go Supine/Side To Sit Outcome: Progressing Flowsheets (Taken 10/25/2022 1421) Pt will go Supine/Side to Sit:  with moderate assist  with minimal assist Goal: Patient Will Transfer Sit To/From Stand Outcome: Progressing Flowsheets (Taken 10/25/2022 1421) Patient will transfer sit to/from stand:  with minimal assist  with moderate assist Goal: Pt Will Transfer Bed To Chair/Chair To Bed Outcome: Progressing Flowsheets (Taken 10/25/2022 1421) Pt will Transfer Bed to Chair/Chair to Bed:  with min assist  with mod assist Goal: Pt Will Ambulate Outcome: Progressing Flowsheets (Taken 10/25/2022 1421) Pt will Ambulate:  10 feet  with moderate assist  with rolling walker   2:23 PM, 10/25/22 Ocie Bob, MPT Physical Therapist with [facility] [phone] office [phone] mobile phone

## 2022-10-25 NOTE — Progress Notes (Signed)
[facility] PROGRESS NOTE   Vernon Campbell JXB:[id number] DOB: [birth year] DOA: 10/23/2022  PCP: System, Provider Not In  Brief History/Interval Summary: 82 y.o. male, with medical history significant for HFpEF, COPD c/b chronic respiratory failure with hypoxia (on baseline 3L [place]), CAD, HTN, obesity-hypoventilation syndrome, HLD, hypothyroidism, OSA and restless leg syndrome with recent hospitalization last month for acute on chronic diastolic CHF, worsening respiratory failure, and COPD exacerbation, and scrotal cellulitis, patient was discharged back to his facility. -Patient was sent by his facility due to complaints of right lower extremity pain, concern for cellulitis, as well left hand pain, patient reports he had an x-ray at the facility which was negative, but patient still reports pain, worsening edema, redness, and both right lower extremity and left hand, at facility he had leg wrap yesterday, but he had significant draining today through the wrap  Consultants: None yet  Procedures: None    Subjective/Interval History: Patient mentions that his left hand does feel better but still swollen and he is unable to move his fingers but able to move them more today compared to yesterday.  Right leg is about the same.  No new complaints offered.  He was told about the CT scan findings.    Assessment/Plan:  Cellulitis involving multiple sites including right lower extremity, left hand and scrotal area Patient presented with significant right lower extremity erythema with blistering with some drainage from the wound posteriorly. Patient also has swelling of the left hand. Patient was started on vancomycin and cefepime.  Noted to have significant swelling of the right lower extremity.  CT scan does not show any abscess.  Continue antibiotics for now. MRI of the left hand could not be done due to technical issues.  Discussed with the MRI tech and due to his body habitus it will be very  difficult to do this test.  In view of the history of cancer MRI.  Will proceed with CT scan of the left hand and the forearm area. Remains afebrile.  WBC is improving.  Procalcitonin 0.55.  Lactic acid level was normal at admission.  Chronic diastolic CHF Continue with torsemide and Aldactone.  Hypothyroidism Continue levothyroxine.  History of COPD/chronic respiratory failure with hypoxia On oxygen at baseline.  Continue inhalers.  Respiratory status appears to be stable.  Restless leg syndrome Continue home medications.  History of coronary artery disease/hyperlipidemia Continue home medications.  Hyponatremia Sodium level is stable.  BPH Continue Flomax.  Class III obesity Estimated body mass index is 41.49 kg/m as calculated from the following:   Height as of this encounter: 5' 9.02" (1.753 m).   Weight as of this encounter: 127.5 kg.   DVT Prophylaxis: Lovenox Code Status: DNR Family Communication: Discussed with patient.  No family at bedside Disposition Plan: Discharge back to SNF when improved  Status is: Inpatient Remains inpatient appropriate because: Cellulitis involving multiple sites requiring IV antibiotics      Medications: Scheduled:  atorvastatin  20 mg Oral QPM   enoxaparin (LOVENOX) injection  60 mg Subcutaneous Q24H   fluticasone furoate-vilanterol  1 puff Inhalation Daily   ipratropium-albuterol  3 mL Nebulization BID   lactulose  10 g Oral Daily   levothyroxine  175 mcg Oral Q0600   liver oil-zinc oxide   Topical TID   nystatin   Topical TID   polyethylene glycol  17 g Oral Daily   potassium chloride SA  20 mEq Oral Daily   pramipexole  1 mg Oral QHS  senna  17.2 mg Oral BID   spironolactone  50 mg Oral Daily   tamsulosin  0.4 mg Oral QPC supper   torsemide  40 mg Oral q1800   torsemide  60 mg Oral QAC breakfast   Continuous:  ceFEPime (MAXIPIME) IV 2 g (10/25/22 0600)   vancomycin 1,250 mg (10/24/22 1725)   ZOX:[id number]  **OR** acetaminophen, albuterol, hydrALAZINE, HYDROcodone-acetaminophen, ondansetron **OR** ondansetron (ZOFRAN) IV  Antibiotics: Anti-infectives (From admission, onward)    Start     Dose/Rate Route Frequency Ordered Stop   10/24/22 1600  vancomycin (VANCOREADY) IVPB 1250 mg/250 mL        1,250 mg 166.7 mL/hr over 90 Minutes Intravenous Every 24 hours 10/23/22 2021 10/30/22 1559   10/23/22 2300  ceFEPIme (MAXIPIME) 2 g in sodium chloride 0.9 % 100 mL IVPB        2 g 200 mL/hr over 30 Minutes Intravenous Every 8 hours 10/23/22 2021 10/30/22 1459   10/23/22 1500  vancomycin (VANCOREADY) IVPB 2000 mg/400 mL        2,000 mg 200 mL/hr over 120 Minutes Intravenous  Once 10/23/22 1447 10/23/22 1930   10/23/22 1445  vancomycin (VANCOCIN) IVPB 1000 mg/200 mL premix  Status:  Discontinued        1,000 mg 200 mL/hr over 60 Minutes Intravenous  Once 10/23/22 1438 10/23/22 1447   10/23/22 1445  ceFEPIme (MAXIPIME) 2 g in sodium chloride 0.9 % 100 mL IVPB        2 g 200 mL/hr over 30 Minutes Intravenous  Once 10/23/22 1438 10/23/22 1533       Objective:  Vital Signs  Vitals:   10/24/22 1212 10/24/22 2054 10/25/22 0516 10/25/22 0718  BP: 110/74 112/64 103/62   Pulse: 97 (!) 104 98   Resp: 20 20 20    Temp: 98 F (36.7 C) 98.3 F (36.8 C) 97.8 F (36.6 C)   TempSrc: Oral     SpO2: 99% 93% 95% 94%  Weight:      Height:        Intake/Output Summary (Last 24 hours) at 10/25/2022 1018 Last data filed at 10/25/2022 0544 Gross per 24 hour  Intake 840.91 ml  Output 1100 ml  Net -259.09 ml   Filed Weights   10/23/22 1304 10/23/22 2049  Weight: 127.5 kg 127.5 kg    General appearance: Awake alert.  In no distress Resp: Clear to auscultation bilaterally.  Normal effort Cardio: S1-S2 is normal regular.  No S3-S4.  No rubs murmurs or bruit GI: Abdomen is soft.  Nontender nondistended.  Bowel sounds are present normal.  No masses organomegaly Extremities: Continues to have swelling of the  left hand.  Some improvement in mobility of the fingers noticed.  Radial pulses are present.  Erythema involving the right lower extremity.  Dressing is noted.   Lab Results:  Data Reviewed: I have personally reviewed following labs and reports of the imaging studies  CBC: Recent Labs  Lab 10/23/22 1409 10/24/22 0525 10/25/22 0435  WBC 21.1* 21.7* 17.6*  NEUTROABS 18.5*  --   --   HGB 16.4 14.4 15.0  HCT 51.8 46.4 47.8  MCV 99.6 100.9* 100.6*  PLT 209 217 234    Basic Metabolic Panel: Recent Labs  Lab 10/23/22 1409 10/24/22 0525 10/25/22 0435  NA 131* 133* 132*  K 4.2 3.8 3.7  CL 92* 92* 93*  CO2 31 30 31   GLUCOSE 120* 134* 113*  BUN 32* 29* 31*  CREATININE 1.17 1.13 1.23  CALCIUM 8.7* 8.6* 8.3*    GFR: Estimated Creatinine Clearance: 59.1 mL/min (by C-G formula based on SCr of 1.23 mg/dL).  Liver Function Tests: Recent Labs  Lab 10/23/22 1409  AST 20  ALT 20  ALKPHOS 158*  BILITOT 2.9*  PROT 7.1  ALBUMIN 3.2*     Recent Results (from the past 240 hour(s))  Blood culture (routine x 2)     Status: None (Preliminary result)   Collection Time: 10/23/22  2:09 PM   Specimen: Right Antecubital; Blood  Result Value Ref Range Status   Specimen Description   Final    RIGHT ANTECUBITAL BOTTLES DRAWN AEROBIC AND ANAEROBIC   Special Requests Blood Culture adequate volume  Final   Culture   Final    NO GROWTH 2 DAYS Performed at [facility], [address] [place], [place] [address]    Report Status PENDING  Incomplete  Blood culture (routine x 2)     Status: None (Preliminary result)   Collection Time: 10/23/22  2:09 PM   Specimen: Left Antecubital; Blood  Result Value Ref Range Status   Specimen Description   Final    LEFT ANTECUBITAL BOTTLES DRAWN AEROBIC AND ANAEROBIC   Special Requests Blood Culture adequate volume  Final   Culture   Final    NO GROWTH 2 DAYS Performed at [facility] [place], [address] [place], [place] [address]    Report Status  PENDING  Incomplete      Radiology Studies: CT TIBIA FIBULA RIGHT W CONTRAST  Result Date: 10/24/2022 CLINICAL DATA:  Soft tissue infection suspected. Right lower extremity pain. Concern for cellulitis. EXAM: CT OF THE LOWER RIGHT EXTREMITY WITH CONTRAST TECHNIQUE: Multidetector CT imaging of the lower right extremity was performed according to the standard protocol following intravenous contrast administration. RADIATION DOSE REDUCTION: This exam was performed according to the departmental dose-optimization program which includes automated exposure control, adjustment of the mA and/or kV according to patient size and/or use of iterative reconstruction technique. 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 Within the limitations of streak artifact, no perihardware lucency is seen to indicate hardware failure or loosening. There is diffuse decreased bone mineralization. The cortices are intact. No cortical erosion to indicate CT evidence of acute osteomyelitis. No acute fracture or dislocation. Mild chronic enthesopathic change at the quadriceps insertion on the patella. Small plantar calcaneal heel spur at the plantar fascia origin. Ligaments Suboptimally assessed by CT. Muscles and Tendons Mild feathery fatty infiltration of the soleus greater than medial head of the gastrocnemius muscles. No gross tendon tear is seen. Soft tissues There is moderate edema and swelling of the right calf subcutaneous fat, greatest within the anterolateral aspect. Moderate diffuse skin thickening. No walled-off fluid collection is seen to indicate an abscess. IMPRESSION: 1. Moderate edema and swelling of the right calf subcutaneous fat, greatest within the anterolateral aspect. Moderate diffuse skin thickening. Findings are compatible with  cellulitis. No walled-off fluid collection is seen to indicate an abscess. 2. No CT evidence of acute osteomyelitis. 3. 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 No evidence of hardware failure or loosening. Electronically Signed   By: Neita Garnet M.D.   On: 10/24/2022 13:50   DG Chest Port 1 View  Result Date: 10/23/2022 CLINICAL DATA:  Hypoxia EXAM: PORTABLE CHEST 1 VIEW COMPARISON:  09/29/2022 FINDINGS: Lung volumes are small  and pulmonary insufflation has diminished since prior examination. Stable superimposed left basilar consolidation. Possible small left pleural effusion again noted. No pneumothorax. Right lung is clear. Cardiac size is mildly enlarged, stable. Pulmonary vascularity is normal. No acute bone abnormality. IMPRESSION: 1. Interval decrease in pulmonary insufflation. 2. Stable left basilar consolidation.  Small left pleural effusion. 3. Stable mild cardiomegaly. Electronically Signed   By: Helyn Numbers M.D.   On: 10/23/2022 21:24   DG Hand 2 View Left  Result Date: 10/23/2022 CLINICAL DATA:  Left hand cellulitis EXAM: LEFT HAND - 2 VIEW COMPARISON:  None Available. FINDINGS: Widening of the scapholunate interval in keeping with disruption of the scapholunate ligament. Otherwise normal alignment. No acute fracture or dislocation. No osseous erosion or abnormal periosteal reaction. Joint spaces are preserved. Diffuse soft tissue swelling of the visualized left wrist and hand. No subcutaneous gas or retained radiopaque foreign body identified. IMPRESSION: 1. Diffuse soft tissue swelling of the visualized left wrist and hand. No subcutaneous gas or retained radiopaque foreign body identified. 2. Scapholunate ligament disruption. Electronically Signed   By: Helyn Numbers M.D.   On: 10/23/2022 21:22       LOS: 2 days   Osvaldo Shipper  [facility] Pager on [email or web address]  10/25/2022, 10:18 AM

## 2022-10-25 NOTE — Plan of Care (Signed)
  Problem: Education: Goal: Knowledge of General Education information will improve Description: Including pain rating scale, medication(s)/side effects and non-pharmacologic comfort measures Outcome: Progressing   Problem: Health Behavior/Discharge Planning: Goal: Ability to manage health-related needs will improve Outcome: Not Met (add Reason)   Problem: Clinical Measurements: Goal: Ability to maintain clinical measurements within normal limits will improve Outcome: Not Met (add Reason)   Problem: Activity: Goal: Risk for activity intolerance will decrease Outcome: Not Met (add Reason)

## 2022-10-25 NOTE — NC FL2 (Signed)
Agency MEDICAID FL2 LEVEL OF CARE FORM     IDENTIFICATION  Patient Name: Vernon Campbell Birthdate: 1940-10-03 Sex: male Admission Date (Current Location): 10/23/2022  [facility] and [place] Number:  [employer] and Address:  [facility],  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 stenosis 09/27/2022   Cellulitis of scrotum 09/27/2022   Scrotal edema 09/27/2022   CHF (congestive heart failure) (HCC) 07/05/2022   COPD (chronic obstructive pulmonary disease) (HCC) 12/11/2021   Hypokalemia 08/20/2021   Restless leg syndrome 08/18/2021   Chronic respiratory failure with hypoxia (HCC) 08/18/2021   Hyperlipidemia 10/01/2020   Hypothyroidism 10/01/2020   Macrocytic anemia 10/01/2020   Obesity, Class III, BMI 40-49.9 (morbid obesity) (HCC) 10/01/2020   Chronic stasis dermatitis 10/01/2020   Right carpal tunnel syndrome 12/19/2018   Mild pulmonary hypertension (HCC) 04/15/2018   Debility 09/23/2017   Obesity hypoventilation syndrome (HCC) 03/06/2017   Chronic knee pain after total replacement of right knee joint 11/09/2016   Coronary artery disease involving native coronary artery 10/29/2015   MGUS (monoclonal gammopathy of unknown significance) 08/15/2015   Osteoarthritis of right hip 01/19/2013   DJD (degenerative joint disease) of knee 09/18/2012   OSA (obstructive sleep apnea) 01/26/2012   Esophageal reflux 05/27/2009   Essential hypertension 08/08/2008   BPH without  urinary obstruction 04/24/2007    Orientation RESPIRATION BLADDER Height & Weight     Self, Time, Situation, Place  O2 (2L) Continent Weight: 281 lb 1.4 oz (127.5 kg) Height:  5' 9.02" (175.3 cm)  BEHAVIORAL SYMPTOMS/MOOD NEUROLOGICAL BOWEL NUTRITION STATUS      Continent Diet (see dc summary)  AMBULATORY STATUS COMMUNICATION OF NEEDS Skin   Extensive Assist Verbally Normal                       Personal Care Assistance Level of Assistance  Bathing, Feeding, Dressing Bathing Assistance: Maximum assistance Feeding assistance: Independent Dressing Assistance: Maximum assistance     Functional Limitations Info  Sight, Hearing, Speech Sight Info: Adequate Hearing Info: Impaired Speech Info: Adequate    SPECIAL CARE FACTORS FREQUENCY                       Contractures Contractures Info: Not present    Additional Factors Info  Code Status, Allergies Code Status Info: DNR Allergies Info: NKA           Current Medications (10/25/2022):  This is the current hospital active medication list Current Facility-Administered Medications  Medication Dose Route Frequency Provider Last Rate Last Admin   acetaminophen (TYLENOL) tablet 650 mg  650 mg Oral Q6H PRN Elgergawy, Leana Roe, MD       Or   acetaminophen (TYLENOL) suppository 650 mg  650 mg Rectal Q6H PRN Elgergawy, Leana Roe, MD       albuterol (PROVENTIL) (2.5 MG/3ML) 0.083% nebulizer solution 2.5 mg  2.5 mg Nebulization Q2H PRN Elgergawy, Leana Roe,  MD       atorvastatin (LIPITOR) tablet 20 mg  20 mg Oral QPM Elgergawy, Leana Roe, MD   20 mg at 10/24/22 1707   ceFEPIme (MAXIPIME) 2 g in sodium chloride 0.9 % 100 mL IVPB  2 g Intravenous Q8H Elgergawy, Dawood S, MD 200 mL/hr at 10/25/22 0600 2 g at 10/25/22 0600   enoxaparin (LOVENOX) injection 60 mg  60 mg Subcutaneous Q24H Elgergawy, Leana Roe, MD   60 mg at 10/24/22 2055   fluticasone furoate-vilanterol (BREO ELLIPTA) 100-25 MCG/ACT 1 puff  1 puff Inhalation Daily  Elgergawy, Leana Roe, MD   1 puff at 10/25/22 0717   hydrALAZINE (APRESOLINE) injection 5 mg  5 mg Intravenous Q4H PRN Elgergawy, Leana Roe, MD       HYDROcodone-acetaminophen (NORCO/VICODIN) 5-325 MG per tablet 1 tablet  1 tablet Oral Q8H PRN Elgergawy, Leana Roe, MD   1 tablet at 10/25/22 0553   ipratropium-albuterol (DUONEB) 0.5-2.5 (3) MG/3ML nebulizer solution 3 mL  3 mL Nebulization BID Elgergawy, Leana Roe, MD   3 mL at 10/25/22 0718   lactulose (CHRONULAC) 10 GM/15ML solution 10 g  10 g Oral Daily Elgergawy, Leana Roe, MD   10 g at 10/25/22 0825   levothyroxine (SYNTHROID) tablet 175 mcg  175 mcg Oral Q0600 Elgergawy, Leana Roe, MD   175 mcg at 10/25/22 0553   liver oil-zinc oxide (DESITIN) 40 % ointment   Topical TID Elgergawy, Leana Roe, MD   Given at 10/25/22 1610   nystatin (MYCOSTATIN/NYSTOP) topical powder   Topical TID Elgergawy, Leana Roe, MD   Given at 10/25/22 0828   ondansetron (ZOFRAN) tablet 4 mg  4 mg Oral Q6H PRN Elgergawy, Leana Roe, MD       Or   ondansetron (ZOFRAN) injection 4 mg  4 mg Intravenous Q6H PRN Elgergawy, Leana Roe, MD   4 mg at 10/24/22 2300   polyethylene glycol (MIRALAX / GLYCOLAX) packet 17 g  17 g Oral Daily Elgergawy, Leana Roe, MD       potassium chloride SA (KLOR-CON M) CR tablet 20 mEq  20 mEq Oral Daily Elgergawy, Leana Roe, MD   20 mEq at 10/25/22 0827   pramipexole (MIRAPEX) tablet 1 mg  1 mg Oral QHS Elgergawy, Leana Roe, MD   1 mg at 10/24/22 2100   senna (SENOKOT) tablet 17.2 mg  17.2 mg Oral BID Elgergawy, Leana Roe, MD   17.2 mg at 10/25/22 9604   spironolactone (ALDACTONE) tablet 50 mg  50 mg Oral Daily Elgergawy, Leana Roe, MD   50 mg at 10/25/22 0825   tamsulosin (FLOMAX) capsule 0.4 mg  0.4 mg Oral QPC supper Elgergawy, Leana Roe, MD   0.4 mg at 10/24/22 1707   torsemide (DEMADEX) tablet 40 mg  40 mg Oral q1800 Elgergawy, Leana Roe, MD   40 mg at 10/24/22 1707   torsemide (DEMADEX) tablet 60 mg  60 mg Oral QAC breakfast Elgergawy, Leana Roe, MD   60 mg at  10/25/22 0825   vancomycin (VANCOREADY) IVPB 1250 mg/250 mL  1,250 mg Intravenous Q24H Elgergawy, Leana Roe, MD 166.7 mL/hr at 10/24/22 1725 1,250 mg at 10/24/22 1725     Discharge Medications: Please see discharge summary for a list of discharge medications.  Relevant Imaging Results:  Relevant Lab Results:   Additional Information    Elliot Gault, LCSW

## 2022-10-25 NOTE — TOC Progression Note (Signed)
Transition of Care [facility]) - Progression Note    Patient Details  Name: Vernon Campbell MRN: [id number] Date of Birth: Apr 03, 1940  Transition of Care [facility]) CM/SW Contact  Elliot Gault, LCSW Phone Number: 10/25/2022, 11:54 AM  Clinical Narrative:     TOC following. Spoke with Paediatric nurse at [facility] to update. Anticipating dc in 2-3 days per MD.  TOC will follow.  Expected Discharge Plan: Long Term Acute Care (LTAC) Barriers to Discharge: Continued Medical Work up  Expected Discharge Plan and Services                                               Social Determinants of Health (SDOH) Interventions SDOH Screenings   Food Insecurity: No Food Insecurity (10/23/2022)  Housing: Low Risk  (10/23/2022)  Transportation Needs: No Transportation Needs (10/23/2022)  Utilities: Not At Risk (10/23/2022)  Social Connections: Unknown (07/31/2021)   [facility] from [facility]  Tobacco Use: Medium Risk (10/23/2022)    Readmission Risk Interventions    10/24/2022    3:34 PM 09/28/2022    2:05 PM 12/14/2021   12:46 PM  Readmission Risk Prevention Plan  Transportation Screening Complete Complete Complete  PCP or Specialist Appt within 3-5 Days Complete Complete   HRI or Home Care Consult Complete Complete Complete  Social Work Consult for Recovery Care Planning/Counseling Complete Complete Complete  Palliative Care Screening Complete Not Applicable Not Applicable  Medication Review Oceanographer) Complete Complete Complete

## 2022-10-25 NOTE — Evaluation (Signed)
Physical Therapy Evaluation Patient Details Name: Vernon Campbell MRN: [id number] DOB: Mar 05, 1941 Today's Date: 10/25/2022  History of Present Illness  Vernon Campbell  is a 82 y.o. male, with medical history significant for HFpEF, COPD c/b chronic respiratory failure with hypoxia (on baseline 3L [place]), CAD, HTN, obesity-hypoventilation syndrome, HLD, hypothyroidism, OSA and restless leg syndrome with recent hospitalization last month for acute on chronic diastolic CHF, worsening respiratory failure, and COPD exacerbation, and scrotal cellulitis, patient was discharged back to his facility.  -Patient was sent by his facility due to complaints of right lower extremity pain, concern for cellulitis, as well left hand pain, patient reports he had an x-ray at the facility which was negative, but patient still reports pain, worsening edema, redness, and both right lower extremity and left hand, at facility he had leg wrap yesterday, but he had significant draining today through the wrap   Clinical Impression  Patient presents seated in chair (assisted by nursing staff) and states he usually sleeps in a recliner at home.  Patient requires increased time for completing sit to stands with limited use of LUE due to weakness, able to take a few side steps to transfer to bedside, but limited mostly due to fatigue and BLE weakness.  Patient tolerated staying up in chair after therapy.  Patient will benefit from continued skilled physical therapy in hospital and recommended venue below to increase strength, balance, endurance for safe ADLs and gait.          If plan is discharge home, recommend the following: A lot of help with walking and/or transfers;A lot of help with bathing/dressing/bathroom;Help with stairs or ramp for entrance;Assistance with cooking/housework   Can travel by private vehicle   No    Equipment Recommendations None recommended by PT  Recommendations for Other Services       Functional Status  Assessment Patient has had a recent decline in their functional status and demonstrates the ability to make significant improvements in function in a reasonable and predictable amount of time.     Precautions / Restrictions Precautions Precautions: Fall Restrictions Weight Bearing Restrictions: No      Mobility  Bed Mobility               General bed mobility comments: Patient presents seated in chair (assisted by nursing staff), states he sleeps in relciner at home    Transfers Overall transfer level: Needs assistance Equipment used: Rolling walker (2 wheels) Transfers: Sit to/from Stand, Bed to chair/wheelchair/BSC Sit to Stand: Mod assist   Step pivot transfers: Mod assist       General transfer comment: increased time, labored movement    Ambulation/Gait Ambulation/Gait assistance: Mod assist, Max assist Gait Distance (Feet): 4 Feet Assistive device: Rolling walker (2 wheels) Gait Pattern/deviations: Decreased step length - right, Decreased step length - left, Decreased stride length Gait velocity: slow     General Gait Details: limited to a few slow labored side steps at bedside, had difficulty holding onto RW with left hand due to weakness  Stairs            Wheelchair Mobility     Tilt Bed    Modified Rankin (Stroke Patients Only)       Balance Overall balance assessment: Needs assistance Sitting-balance support: Feet supported, No upper extremity supported Sitting balance-Leahy Scale: Fair Sitting balance - Comments: fair/good seated at EOB   Standing balance support: Reliant on assistive device for balance, During functional activity, Bilateral upper extremity supported Standing balance-Leahy  Scale: Poor Standing balance comment: fair/poor using RW                             Pertinent Vitals/Pain Pain Assessment Pain Assessment: Faces Faces Pain Scale: Hurts a little bit Pain Location: LUE Pain Descriptors /  Indicators: Discomfort, Sore Pain Intervention(s): Limited activity within patient's tolerance, Monitored during session, Repositioned    Home Living Family/patient expects to be discharged to:: Skilled nursing facility                        Prior Function Prior Level of Function : Needs assist       Physical Assist : Mobility (physical);ADLs (physical) Mobility (physical): Transfers;Gait;Stairs;Bed mobility   Mobility Comments: Assisted transfers to wheelchair, uses wheelchair for mobility, non-ambulatory other than taking a few steps with RW during transfers, sleeps in a recliner ADLs Comments: Assisted by SNF staff     Hand Dominance        Extremity/Trunk Assessment   Upper Extremity Assessment Upper Extremity Assessment: Defer to OT evaluation    Lower Extremity Assessment Lower Extremity Assessment: Generalized weakness    Cervical / Trunk Assessment Cervical / Trunk Assessment: Kyphotic  Communication   Communication: No difficulties  Cognition Arousal/Alertness: Awake/alert Behavior During Therapy: WFL for tasks assessed/performed Overall Cognitive Status: Within Functional Limits for tasks assessed                                          General Comments      Exercises     Assessment/Plan    PT Assessment Patient needs continued PT services  PT Problem List Decreased strength;Decreased activity tolerance;Decreased mobility;Decreased balance       PT Treatment Interventions DME instruction;Gait training;Stair training;Functional mobility training;Therapeutic activities;Therapeutic exercise;Patient/family education;Balance training    PT Goals (Current goals can be found in the Care Plan section)  Acute Rehab PT Goals Patient Stated Goal: return home with SNF staff to assist PT Goal Formulation: With patient Time For Goal Achievement: 11/08/22 Potential to Achieve Goals: Good    Frequency Min 3X/week      Co-evaluation               AM-PAC PT "6 Clicks" Mobility  Outcome Measure Help needed turning from your back to your side while in a flat bed without using bedrails?: A Lot Help needed moving from lying on your back to sitting on the side of a flat bed without using bedrails?: A Lot Help needed moving to and from a bed to a chair (including a wheelchair)?: A Lot Help needed standing up from a chair using your arms (e.g., wheelchair or bedside chair)?: A Lot Help needed to walk in hospital room?: A Lot Help needed climbing 3-5 steps with a railing? : Total 6 Click Score: 11    End of Session   Activity Tolerance: Patient tolerated treatment well;Patient limited by fatigue Patient left: in chair;with call bell/phone within reach Nurse Communication: Mobility status PT Visit Diagnosis: Unsteadiness on feet (R26.81);Other abnormalities of gait and mobility (R26.89);Muscle weakness (generalized) (M62.81)    Time: 1308-6578 PT Time Calculation (min) (ACUTE ONLY): 24 min   Charges:   PT Evaluation $PT Eval Moderate Complexity: 1 Mod PT Treatments $Therapeutic Activity: 23-37 mins PT General Charges $$ ACUTE PT VISIT: 1 Visit  2:19 PM, 10/25/22 Ocie Bob, MPT Physical Therapist with [facility] [phone] office [phone] mobile phone

## 2022-10-26 DIAGNOSIS — L03114 Cellulitis of left upper limb: Secondary | ICD-10-CM | POA: Diagnosis not present

## 2022-10-26 DIAGNOSIS — J9611 Chronic respiratory failure with hypoxia: Secondary | ICD-10-CM | POA: Diagnosis not present

## 2022-10-26 DIAGNOSIS — E039 Hypothyroidism, unspecified: Secondary | ICD-10-CM | POA: Diagnosis not present

## 2022-10-26 DIAGNOSIS — L03115 Cellulitis of right lower limb: Secondary | ICD-10-CM | POA: Diagnosis not present

## 2022-10-26 LAB — VANCOMYCIN, TROUGH: Vancomycin Tr: 14 ug/mL — ABNORMAL LOW (ref 15–20)

## 2022-10-26 MED ORDER — TORSEMIDE 20 MG PO TABS: 60.0000 mg | ORAL_TABLET | Freq: Every day | ORAL | Status: DC

## 2022-10-26 MED ORDER — SODIUM CHLORIDE 0.9 % IV SOLN
2.0000 g | Freq: Two times a day (BID) | INTRAVENOUS | Status: DC
  Administered 2022-10-26 – 2022-10-28 (×4): 2 g via INTRAVENOUS
  Filled 2022-10-26 (×4): qty 12.5

## 2022-10-26 MED ORDER — VANCOMYCIN VARIABLE DOSE PER UNSTABLE RENAL FUNCTION (PHARMACIST DOSING): Status: DC

## 2022-10-26 MED ORDER — VANCOMYCIN HCL 1250 MG/250ML IV SOLN
1250.0000 mg | Freq: Once | INTRAVENOUS | Status: AC
  Administered 2022-10-26: 1250 mg via INTRAVENOUS
  Filled 2022-10-26: qty 250

## 2022-10-26 MED ORDER — SODIUM CHLORIDE 0.9 % IV SOLN: INTRAVENOUS | Status: DC | PRN

## 2022-10-26 MED ORDER — ALPRAZOLAM 0.5 MG PO TABS
0.5000 mg | ORAL_TABLET | Freq: Once | ORAL | Status: AC
  Administered 2022-10-26: 0.5 mg via ORAL
  Filled 2022-10-26: qty 1

## 2022-10-26 NOTE — Progress Notes (Signed)
[facility] PROGRESS NOTE   Fode Gulino NWG:[id number] DOB: 1941/02/15 DOA: 10/23/2022  PCP: System, Provider Not In  Brief History/Interval Summary: 82 y.o. male, with medical history significant for HFpEF, COPD c/b chronic respiratory failure with hypoxia (on baseline 3L [place]), CAD, HTN, obesity-hypoventilation syndrome, HLD, hypothyroidism, OSA and restless leg syndrome with recent hospitalization last month for acute on chronic diastolic CHF, worsening respiratory failure, and COPD exacerbation, and scrotal cellulitis, patient was discharged back to his facility. -Patient was sent by his facility due to complaints of right lower extremity pain, concern for cellulitis, as well left hand pain, patient reports he had an x-ray at the facility which was negative, but patient still reports pain, worsening edema, redness, and both right lower extremity and left hand, at facility he had leg wrap yesterday, but he had significant draining today through the wrap  Consultants: Dr. Dallas Schimke with orthopedics.  Procedures: None    Subjective/Interval History: Patient mentions that he continues to have a lot of discomfort in the left hand.  The swelling or does not appear to be improving.  He says that his right leg feels better.     Assessment/Plan:  Cellulitis involving multiple sites including right lower extremity, left hand and scrotal area Patient presented with significant right lower extremity erythema with blistering with some drainage from the wound posteriorly. Patient also has swelling of the left hand. Patient was started on vancomycin and cefepime.   Noted to have significant swelling of the right lower extremity.  CT scan does not show any abscess.  Leg appears to be improving.  Continue antibiotics for now. MRI of the left hand could not be done due to technical issues.  Discussed with the MRI tech and due to his body habitus it will be very difficult to do this test.  In view of  this a CT scan of the left hand and forearm was performed which does not show any drainable abscess. Discussed with Dr. Dallas Schimke with orthopedic surgery who has consulted for the left hand cellulitis.  He recommends continuing antibiotics.  Keep the arm elevated.  Warm compresses may help as well. WBC is gradually improving.  Procalcitonin was 0.55.  Elevated creatinine Creatinine noted to be 1.53 this morning from 1.23 yesterday.  He is noted to be on vancomycin.  He is also on torsemide and spironolactone.  These will be held for today.  Torsemide dose decreased to once a day starting tomorrow.  Will repeat blood work tomorrow.  Chronic diastolic CHF Continue with torsemide and Aldactone.  See above.  Some of these medications are being adjusted due to rising creatinine.  Hypothyroidism Continue levothyroxine.  History of COPD/chronic respiratory failure with hypoxia On oxygen at baseline.  Continue inhalers.  Respiratory status appears to be stable.  Restless leg syndrome Continue home medications.  History of coronary artery disease/hyperlipidemia Continue home medications.  Hyponatremia Sodium level is stable.  BPH Continue Flomax.  Class III obesity Estimated body mass index is 41.49 kg/m as calculated from the following:   Height as of this encounter: 5' 9.02" (1.753 m).   Weight as of this encounter: 127.5 kg.   DVT Prophylaxis: Lovenox Code Status: DNR Family Communication: Discussed with patient.  No family at bedside Disposition Plan: Discharge back to SNF when improved  Status is: Inpatient Remains inpatient appropriate because: Cellulitis involving multiple sites requiring IV antibiotics      Medications: Scheduled:  atorvastatin  20 mg Oral QPM   enoxaparin (LOVENOX) injection  60 mg Subcutaneous Q24H   fluticasone furoate-vilanterol  1 puff Inhalation Daily   ipratropium-albuterol  3 mL Nebulization BID   lactulose  10 g Oral Daily   levothyroxine   175 mcg Oral Q0600   liver oil-zinc oxide   Topical TID   nystatin   Topical TID   polyethylene glycol  17 g Oral Daily   potassium chloride SA  20 mEq Oral Daily   pramipexole  1 mg Oral QHS   senna  17.2 mg Oral BID   tamsulosin  0.4 mg Oral QPC supper   [START ON 10/27/2022] torsemide  60 mg Oral QAC breakfast   Continuous:  ceFEPime (MAXIPIME) IV     vancomycin 1,250 mg (10/25/22 1601)   ZOX:[id number] **OR** acetaminophen, albuterol, hydrALAZINE, HYDROcodone-acetaminophen, ondansetron **OR** ondansetron (ZOFRAN) IV  Antibiotics: Anti-infectives (From admission, onward)    Start     Dose/Rate Route Frequency Ordered Stop   10/26/22 1800  ceFEPIme (MAXIPIME) 2 g in sodium chloride 0.9 % 100 mL IVPB        2 g 200 mL/hr over 30 Minutes Intravenous Every 12 hours 10/26/22 0831     10/24/22 1600  vancomycin (VANCOREADY) IVPB 1250 mg/250 mL        1,250 mg 166.7 mL/hr over 90 Minutes Intravenous Every 24 hours 10/23/22 2021 10/30/22 1559   10/23/22 2300  ceFEPIme (MAXIPIME) 2 g in sodium chloride 0.9 % 100 mL IVPB  Status:  Discontinued        2 g 200 mL/hr over 30 Minutes Intravenous Every 8 hours 10/23/22 2021 10/26/22 0831   10/23/22 1500  vancomycin (VANCOREADY) IVPB 2000 mg/400 mL        2,000 mg 200 mL/hr over 120 Minutes Intravenous  Once 10/23/22 1447 10/23/22 1930   10/23/22 1445  vancomycin (VANCOCIN) IVPB 1000 mg/200 mL premix  Status:  Discontinued        1,000 mg 200 mL/hr over 60 Minutes Intravenous  Once 10/23/22 1438 10/23/22 1447   10/23/22 1445  ceFEPIme (MAXIPIME) 2 g in sodium chloride 0.9 % 100 mL IVPB        2 g 200 mL/hr over 30 Minutes Intravenous  Once 10/23/22 1438 10/23/22 1533       Objective:  Vital Signs  Vitals:   10/25/22 1959 10/25/22 2129 10/26/22 0501 10/26/22 0726  BP:  111/69 110/72   Pulse:  (!) 103 97   Resp:  18    Temp:  98.5 F (36.9 C) 98.3 F (36.8 C)   TempSrc:  Oral Oral   SpO2: (!) 84% 91% 94% 94%  Weight:       Height:        Intake/Output Summary (Last 24 hours) at 10/26/2022 0913 Last data filed at 10/26/2022 0834 Gross per 24 hour  Intake 600 ml  Output 3400 ml  Net -2800 ml   Filed Weights   10/23/22 1304 10/23/22 2049  Weight: 127.5 kg 127.5 kg    General appearance: Awake alert.  In no distress Resp: Clear to auscultation bilaterally.  Normal effort Cardio: S1-S2 is normal regular.  No S3-S4.  No rubs murmurs or bruit GI: Abdomen is soft.  Nontender nondistended.  Bowel sounds are present normal.  No masses organomegaly Extremities: Left hand and forearm still swollen.  Swelling primarily in the hand primarily in the dorsal area.  Some warmth is appreciated.  Limited mobility of the fingers. Neurologic: Alert and oriented x3.  No focal neurological deficits.  Lab Results:  Data Reviewed: I have personally reviewed following labs and reports of the imaging studies  CBC: Recent Labs  Lab 10/23/22 1409 10/24/22 0525 10/25/22 0435 10/26/22 0428  WBC 21.1* 21.7* 17.6* 15.1*  NEUTROABS 18.5*  --   --   --   HGB 16.4 14.4 15.0 13.6  HCT 51.8 46.4 47.8 44.2  MCV 99.6 100.9* 100.6* 100.5*  PLT 209 217 234 234    Basic Metabolic Panel: Recent Labs  Lab 10/23/22 1409 10/24/22 0525 10/25/22 0435 10/26/22 0428  NA 131* 133* 132* 134*  K 4.2 3.8 3.7 3.7  CL 92* 92* 93* 92*  CO2 31 30 31 29   GLUCOSE 120* 134* 113* 114*  BUN 32* 29* 31* 34*  CREATININE 1.17 1.13 1.23 1.53*  CALCIUM 8.7* 8.6* 8.3* 8.5*    GFR: Estimated Creatinine Clearance: 47.5 mL/min (A) (by C-G formula based on SCr of 1.53 mg/dL (H)).  Liver Function Tests: Recent Labs  Lab 10/23/22 1409  AST 20  ALT 20  ALKPHOS 158*  BILITOT 2.9*  PROT 7.1  ALBUMIN 3.2*     Recent Results (from the past 240 hour(s))  Blood culture (routine x 2)     Status: None (Preliminary result)   Collection Time: 10/23/22  2:09 PM   Specimen: Right Antecubital; Blood  Result Value Ref Range Status   Specimen  Description   Final    RIGHT ANTECUBITAL BOTTLES DRAWN AEROBIC AND ANAEROBIC   Special Requests Blood Culture adequate volume  Final   Culture   Final    NO GROWTH 2 DAYS Performed at [facility], [address], [place], [place] [address]    Report Status PENDING  Incomplete  Blood culture (routine x 2)     Status: None (Preliminary result)   Collection Time: 10/23/22  2:09 PM   Specimen: Left Antecubital; Blood  Result Value Ref Range Status   Specimen Description   Final    LEFT ANTECUBITAL BOTTLES DRAWN AEROBIC AND ANAEROBIC   Special Requests Blood Culture adequate volume  Final   Culture   Final    NO GROWTH 2 DAYS Performed at [facility], [address] [place], [place] [address]    Report Status PENDING  Incomplete      Radiology Studies: CT HAND LEFT W CONTRAST  Result Date: 10/25/2022 CLINICAL DATA:  Chronic hand pain. Forearm and hand swelling. Inflammation and redness. Evaluate for cellulitis. Inflammatory arthritis suspected. EXAM: CT OF THE UPPER LEFT EXTREMITY WITH CONTRAST TECHNIQUE: Multidetector CT imaging of the left forearm and left hand was performed according to the standard protocol following intravenous contrast administration. RADIATION DOSE REDUCTION: This exam was performed according to the departmental dose-optimization program which includes automated exposure control, adjustment of the mA and/or kV according to patient size and/or use of iterative reconstruction technique. CONTRAST:  75mL OMNIPAQUE IOHEXOL 300 MG/ML  SOLN COMPARISON:  None Available. FINDINGS: Left forearm: Bones/Joint/Cartilage There is a 5 mm subchondral cysts versus erosion at the distal medial volar aspect of the radius (coronal series 6, image 44). Additional 2 mm subchondral cyst versus erosion within the radial styloid (coronal series 6, image 47). No acute fracture or dislocation within the radius or ulna. Ligaments Suboptimally assessed by CT. Muscles and Tendons Normal size and  density of the regional forearm musculature. Soft tissues Mild edema and swelling of the subcutaneous fat of the dorsal ulnar aspect of the mid to distal forearm. No walled-off abscess is seen. ---- Left hand: Bones/Joint/Cartilage There  are lucent cysts versus erosions seen at the distal dorsal radius, adjacent proximal lunate, mid to distal, lateral aspect of the scaphoid, proximal triquetrum, and minimally at the proximal medial aspect of the trapezium. 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 Moderate peripheral medial and lateral second metacarpophalangeal lucent likely erosions on both sides of the joint. Moderate dorsal medial and dorsal lateral third and fifth metacarpal head cortical erosions. Scattered mild-to-moderate lucent marginal cysts versus erosions throughout the second through fifth interphalangeal joints. Generally moderate second through fifth DIP and mild-to-moderate second through fifth PIP joint space narrowing. Ligaments No gross tendon tear is visualized. Normal size and density of the regional musculature. Muscles and Tendons No gross tendon tear is seen. Soft tissues Mild diffuse hand soft tissue swelling. Moderate dorsal subcutaneous fat edema and swelling at the level of the metacarpal heads. No walled-off abscess is seen. IMPRESSION: 1. Scattered lucent cysts versus erosions throughout the left hand and wrist as above. At the level of the metacarpophalangeal joints, findings appear to represent marginal erosions. The radiocarpal and intercarpal lucencies also may represent erosions. Findings are compatible with an inflammatory arthropathy such as rheumatoid arthritis. 2. Generally moderate second through fifth DIP and mild-to-moderate second through fifth PIP joint space narrowing. 3. Moderate dorsal subcutaneous fat edema and swelling at the level of the metacarpal heads. 4. Mild edema and swelling of the subcutaneous fat  of the dorsal ulnar aspect of the mid to distal forearm. 5. The soft tissue swelling is compatible with cellulitis of the hand greater than forearm. No walled-off abscess is seen. Electronically Signed   By: Neita Garnet M.D.   On: 10/25/2022 17:25   CT FOREARM LEFT W CONTRAST  Result Date: 10/25/2022 CLINICAL DATA:  Chronic hand pain. Forearm and hand swelling. Inflammation and redness. Evaluate for cellulitis. Inflammatory arthritis suspected. EXAM: CT OF THE UPPER LEFT EXTREMITY WITH CONTRAST TECHNIQUE: Multidetector CT imaging of the left forearm and left hand was performed according to the standard protocol following intravenous contrast administration. RADIATION DOSE REDUCTION: This exam was performed according to the departmental dose-optimization program which includes automated exposure control, adjustment of the mA and/or kV according to patient size and/or use of iterative reconstruction technique. CONTRAST:  75mL OMNIPAQUE IOHEXOL 300 MG/ML  SOLN COMPARISON:  None Available. FINDINGS: Left forearm: Bones/Joint/Cartilage There is a 5 mm subchondral cysts versus erosion at the distal medial volar aspect of the radius (coronal series 6, image 44). Additional 2 mm subchondral cyst versus erosion within the radial styloid (coronal series 6, image 47). No acute fracture or dislocation within the radius or ulna. Ligaments Suboptimally assessed by CT. Muscles and Tendons Normal size and density of the regional forearm musculature. Soft tissues Mild edema and swelling of the subcutaneous fat of the dorsal ulnar aspect of the mid to distal forearm. No walled-off abscess is seen. ---- Left hand: Bones/Joint/Cartilage There are lucent cysts versus erosions seen at the distal dorsal radius, adjacent proximal lunate, mid to distal, lateral aspect of the scaphoid, proximal triquetrum, and minimally at the proximal medial aspect of the trapezium. 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 Moderate peripheral medial and lateral second metacarpophalangeal lucent likely erosions on both sides of the joint. Moderate dorsal medial and dorsal lateral third and fifth metacarpal head cortical erosions. Scattered mild-to-moderate lucent marginal cysts versus erosions throughout the second through fifth interphalangeal joints. Generally moderate second through fifth DIP  and mild-to-moderate second through fifth PIP joint space narrowing. Ligaments No gross tendon tear is visualized. Normal size and density of the regional musculature. Muscles and Tendons No gross tendon tear is seen. Soft tissues Mild diffuse hand soft tissue swelling. Moderate dorsal subcutaneous fat edema and swelling at the level of the metacarpal heads. No walled-off abscess is seen. IMPRESSION: 1. Scattered lucent cysts versus erosions throughout the left hand and wrist as above. At the level of the metacarpophalangeal joints, findings appear to represent marginal erosions. The radiocarpal and intercarpal lucencies also may represent erosions. Findings are compatible with an inflammatory arthropathy such as rheumatoid arthritis. 2. Generally moderate second through fifth DIP and mild-to-moderate second through fifth PIP joint space narrowing. 3. Moderate dorsal subcutaneous fat edema and swelling at the level of the metacarpal heads. 4. Mild edema and swelling of the subcutaneous fat of the dorsal ulnar aspect of the mid to distal forearm. 5. The soft tissue swelling is compatible with cellulitis of the hand greater than forearm. No walled-off abscess is seen. Electronically Signed   By: Neita Garnet M.D.   On: 10/25/2022 17:25   CT TIBIA FIBULA RIGHT W CONTRAST  Result Date: 10/24/2022 CLINICAL DATA:  Soft tissue infection suspected. Right lower extremity pain. Concern for cellulitis. EXAM: CT OF THE LOWER RIGHT EXTREMITY WITH CONTRAST TECHNIQUE: Multidetector CT imaging of the lower right  extremity was performed according to the standard protocol following intravenous contrast administration. RADIATION DOSE REDUCTION: This exam was performed according to the departmental dose-optimization program which includes automated exposure control, adjustment of the mA and/or kV according to patient size and/or use of iterative reconstruction technique. 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 Within the limitations of streak artifact, no perihardware lucency is seen to indicate hardware failure or loosening. There is diffuse decreased bone mineralization. The cortices are intact. No cortical erosion to indicate CT evidence of acute osteomyelitis. No acute fracture or dislocation. Mild chronic enthesopathic change at the quadriceps insertion on the patella. Small plantar calcaneal heel spur at the plantar fascia origin. Ligaments Suboptimally assessed by CT. Muscles and Tendons Mild feathery fatty infiltration of the soleus greater than medial head of the gastrocnemius muscles. No gross tendon tear is seen. Soft tissues There is moderate edema and swelling of the right calf subcutaneous fat, greatest within the anterolateral aspect. Moderate diffuse skin thickening. No walled-off fluid collection is seen to indicate an abscess. IMPRESSION: 1. Moderate edema and swelling of the right calf subcutaneous fat, greatest within the anterolateral aspect. Moderate diffuse skin thickening. Findings are compatible with cellulitis. No walled-off fluid collection is seen to indicate an abscess. 2. No CT evidence of acute osteomyelitis. 3. 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 No evidence of hardware failure or loosening. Electronically Signed   By: Neita Garnet M.D.   On: 10/24/2022 13:50       LOS: 3 days     Rito Ehrlich  [facility] Pager on [email or web address]  10/26/2022, 9:13 AM

## 2022-10-26 NOTE — Consult Note (Signed)
ORTHOPAEDIC CONSULTATION  REQUESTING PHYSICIAN: Osvaldo Shipper, MD  ASSESSMENT AND PLAN: 82 y.o. male with the following: Left hand, diffuse swelling   Orthopedics recommends admission to a medical service and we will provide consultation and follow along.  Acute onset swelling to the left upper extremity.  He has pain in the left hand as well as the left wrist.  He has pain with motion.  CT scan is without drainable abscess.  Will continue to follow.  Recommend continued antibiotics, as his white count continues to trend down.  In addition, recommend strict elevation in order to help with the swelling.    Will continue to monitor.  - Weight Bearing Status/Activity: As tolerated  - Additional recommended labs/tests: None  -VTE Prophylaxis: As needed  - Pain control: As needed  - Follow-up plan: To be determined  -Procedures: None  Chief Complaint: Left hand pain  HPI: Vernon Campbell is a 82 y.o. male with significant past medical history as listed below.  He is right-hand dominant.  He reports a couple of days of progressively worsening pain, redness and swelling to the left hand and forearm.  No specific onset.  He does not have a diagnosis of rheumatoid arthritis or inflammatory arthropathy.  No specific injury.  He has limited use of the left hand currently.  No numbness or tingling.  Pain is worse with motion.  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 No pertinent surgical history. Social History   Socioeconomic History   Marital status: Single    Spouse name: Not on file   Number of children: Not on file   Years of education: Not on file   Highest education level: Not on file  Occupational History   Not on file  Tobacco Use   Smoking status: Former    Types: Cigarettes   Smokeless tobacco: Never  Vaping Use   Vaping status: Never Used  Substance and Sexual Activity   Alcohol use: Not Currently   Drug use: Not Currently   Sexual activity: Not Currently  Other Topics Concern   Not on file  Social History Narrative   Not on file   Social Determinants of Health   Financial  Resource Strain: Not on file  Food Insecurity: No Food Insecurity (10/23/2022)   Hunger Vital Sign    Worried About Running Out of Food in the Last Year: Never true    Ran Out of Food in the Last Year: Never true  Transportation Needs: No Transportation Needs (10/23/2022)   PRAPARE - Administrator, Civil Service (Medical): No    Lack of Transportation (Non-Medical): No  Physical Activity: Not on file  Stress: Not on file  Social Connections: Unknown (07/31/2021)   Received from [facility], [facility]   Social Network    Social Network: Not on file   History reviewed. No pertinent family history. No Known Allergies Prior to Admission  medications   Medication Sig Start Date End Date Taking? Authorizing Provider  albuterol (PROVENTIL) (2.5 MG/3ML) 0.083% nebulizer solution Take 3 mLs (2.5 mg total) by nebulization every 2 (two) hours as needed for shortness of breath. 12/15/21  Yes Emokpae, Courage, MD  atorvastatin (LIPITOR) 20 MG tablet Take 20 mg by mouth every evening.   Yes [provider]  bacitracin 500 UNIT/GM ointment Apply 1 Application topically 2 (two) times daily.   Yes [provider]  fluticasone furoate-vilanterol (BREO ELLIPTA) 100-25 MCG/ACT AEPB Inhale 1 puff into the lungs daily. 12/16/21  Yes Emokpae, Courage, MD  HYDROcodone-acetaminophen (NORCO/VICODIN) 5-325 MG tablet Take 1 tablet by mouth every 8 (eight) hours as needed for moderate pain. 10/02/22  Yes Shah, Pratik D, DO  ketoconazole (NIZORAL) 2 % shampoo Apply 1 application  topically 2 (two) times a week. Apply to scalp (shampoo) topically during day shift every Tuesday and Friday for tinea versicolor (bath days)   Yes [provider]  lactulose (CHRONULAC) 10 GM/15ML solution Take 15 mLs by mouth daily.   Yes [provider]  levothyroxine (SYNTHROID) 175 MCG tablet Take 175 mcg by mouth daily before breakfast.   Yes [provider]  OXYGEN Inhale 2 L/min into the lungs continuous.   Yes [provider]  polyethylene glycol (MIRALAX / GLYCOLAX) 17 g packet Take 17 g by mouth daily.   Yes [provider]  potassium chloride SA (KLOR-CON) 20 MEQ tablet Take 20 mEq by mouth daily.   Yes [provider]  pramipexole (MIRAPEX) 1 MG tablet Take 1 tablet (1 mg total) by mouth at bedtime. 07/07/22  Yes Sherryll Burger, Pratik D, DO  senna (SENOKOT) 8.6 MG TABS tablet Take 17.2 mg by mouth 2 (two) times daily.   Yes [provider]  spironolactone (ALDACTONE) 50 MG tablet Take 1 tablet (50 mg total) by mouth daily. 10/06/22 11/05/22 Yes Shah, Pratik D, DO  tamsulosin (FLOMAX) 0.4 MG CAPS capsule  Take 1 capsule (0.4 mg total) by mouth daily after supper. Patient taking differently: Take 0.4 mg by mouth every evening. 09/06/22  Yes Donnita Falls, FNP  Torsemide 40 MG TABS Take 40 mg by mouth See admin instructions. Take 60 mg every morning and 40 mg every evening 10/06/22  Yes Shah, Pratik D, DO   CT HAND LEFT W CONTRAST  Result Date: 10/25/2022 CLINICAL DATA:  Chronic hand pain. Forearm and hand swelling. Inflammation and redness. Evaluate for cellulitis. Inflammatory arthritis suspected. EXAM: CT OF THE UPPER LEFT EXTREMITY WITH CONTRAST TECHNIQUE: Multidetector CT imaging of the left forearm and left hand was performed according to the standard protocol following intravenous contrast administration. RADIATION DOSE REDUCTION: This exam was performed according to the departmental dose-optimization program which includes automated exposure control, adjustment  of the mA and/or kV according to patient size and/or use of iterative reconstruction technique. CONTRAST:  75mL OMNIPAQUE IOHEXOL 300 MG/ML  SOLN COMPARISON:  None Available. FINDINGS: Left forearm: Bones/Joint/Cartilage There is a 5 mm subchondral cysts versus erosion at the distal medial volar aspect of the radius (coronal series 6, image 44). Additional 2 mm subchondral cyst versus erosion within the radial styloid (coronal series 6, image 47). No acute fracture or dislocation within the radius or ulna. Ligaments Suboptimally assessed by CT. Muscles and Tendons Normal size and density of the regional forearm musculature. Soft tissues Mild edema and swelling of the subcutaneous fat of the dorsal ulnar aspect of the mid to distal forearm. No walled-off abscess is seen. ---- Left hand: Bones/Joint/Cartilage There are lucent cysts versus erosions seen at the distal dorsal radius, adjacent proximal lunate, mid to distal, lateral aspect of the scaphoid, proximal triquetrum, and minimally at the proximal medial aspect of the trapezium. 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 Moderate peripheral medial and lateral second metacarpophalangeal lucent likely erosions on both sides of the joint. Moderate dorsal medial and dorsal lateral third and fifth metacarpal head cortical erosions. Scattered mild-to-moderate lucent marginal cysts versus erosions throughout the second through fifth interphalangeal joints. Generally moderate second through fifth DIP and mild-to-moderate second through fifth PIP joint space narrowing. Ligaments No gross tendon tear is visualized. Normal size and density of the regional musculature. Muscles and Tendons No gross tendon tear is seen. Soft tissues Mild diffuse hand soft tissue swelling. Moderate dorsal subcutaneous fat edema and swelling at the level of the metacarpal heads. No walled-off abscess is seen. IMPRESSION: 1. Scattered lucent cysts versus erosions throughout the left hand and wrist as above. At the level of the metacarpophalangeal joints, findings appear to represent marginal erosions. The radiocarpal and intercarpal lucencies also may represent erosions. Findings are compatible with an inflammatory arthropathy such as rheumatoid arthritis. 2. Generally moderate second through fifth DIP and mild-to-moderate second through fifth PIP joint space narrowing. 3. Moderate dorsal subcutaneous fat edema and swelling at the level of the metacarpal heads. 4. Mild edema and swelling of the subcutaneous fat of the dorsal ulnar aspect of the mid to distal forearm. 5. The soft tissue swelling is compatible with cellulitis of the hand greater than forearm. No walled-off abscess is seen. Electronically Signed   By: Neita Garnet M.D.   On: 10/25/2022 17:25   CT FOREARM LEFT W CONTRAST  Result Date: 10/25/2022 CLINICAL DATA:  Chronic hand pain. Forearm and hand swelling. Inflammation and redness. Evaluate for cellulitis. Inflammatory arthritis suspected. EXAM: CT OF THE UPPER  LEFT EXTREMITY WITH CONTRAST TECHNIQUE: Multidetector CT imaging of the left forearm and left hand was performed according to the standard protocol following intravenous contrast administration. RADIATION DOSE REDUCTION: This exam was performed according to the departmental dose-optimization program which includes automated exposure control, adjustment of the mA and/or kV according to patient size and/or use of iterative reconstruction technique. CONTRAST:  75mL OMNIPAQUE IOHEXOL 300 MG/ML  SOLN COMPARISON:  None Available. FINDINGS: Left forearm: Bones/Joint/Cartilage There is a 5 mm subchondral cysts versus erosion at the distal medial volar aspect of the radius (coronal series 6, image 44). Additional 2 mm subchondral cyst versus erosion within the radial styloid (coronal series 6, image 47). No acute fracture or dislocation within the radius or ulna. Ligaments Suboptimally assessed by CT. Muscles and Tendons Normal size and density of the regional forearm musculature.  Soft tissues Mild edema and swelling of the subcutaneous fat of the dorsal ulnar aspect of the mid to distal forearm. No walled-off abscess is seen. ---- Left hand: Bones/Joint/Cartilage There are lucent cysts versus erosions seen at the distal dorsal radius, adjacent proximal lunate, mid to distal, lateral aspect of the scaphoid, proximal triquetrum, and minimally at the proximal medial aspect of the trapezium. 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 Moderate peripheral medial and lateral second metacarpophalangeal lucent likely erosions on both sides of the joint. Moderate dorsal medial and dorsal lateral third and fifth metacarpal head cortical erosions. Scattered mild-to-moderate lucent marginal cysts versus erosions throughout the second through fifth interphalangeal joints. Generally moderate second through fifth DIP and mild-to-moderate second through fifth PIP joint space  narrowing. Ligaments No gross tendon tear is visualized. Normal size and density of the regional musculature. Muscles and Tendons No gross tendon tear is seen. Soft tissues Mild diffuse hand soft tissue swelling. Moderate dorsal subcutaneous fat edema and swelling at the level of the metacarpal heads. No walled-off abscess is seen. IMPRESSION: 1. Scattered lucent cysts versus erosions throughout the left hand and wrist as above. At the level of the metacarpophalangeal joints, findings appear to represent marginal erosions. The radiocarpal and intercarpal lucencies also may represent erosions. Findings are compatible with an inflammatory arthropathy such as rheumatoid arthritis. 2. Generally moderate second through fifth DIP and mild-to-moderate second through fifth PIP joint space narrowing. 3. Moderate dorsal subcutaneous fat edema and swelling at the level of the metacarpal heads. 4. Mild edema and swelling of the subcutaneous fat of the dorsal ulnar aspect of the mid to distal forearm. 5. The soft tissue swelling is compatible with cellulitis of the hand greater than forearm. No walled-off abscess is seen. Electronically Signed   By: Neita Garnet M.D.   On: 10/25/2022 17:25   CT TIBIA FIBULA RIGHT W CONTRAST  Result Date: 10/24/2022 CLINICAL DATA:  Soft tissue infection suspected. Right lower extremity pain. Concern for cellulitis. EXAM: CT OF THE LOWER RIGHT EXTREMITY WITH CONTRAST TECHNIQUE: Multidetector CT imaging of the lower right extremity was performed according to the standard protocol following intravenous contrast administration. RADIATION DOSE REDUCTION: This exam was performed according to the departmental dose-optimization program which includes automated exposure control, adjustment of the mA and/or kV according to patient size and/or use of iterative reconstruction technique. 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 Within the limitations of streak artifact, no perihardware lucency is seen to indicate hardware failure or loosening. There is diffuse decreased bone mineralization. The cortices are intact. No cortical erosion to indicate CT evidence of acute osteomyelitis. No acute fracture or dislocation. Mild chronic enthesopathic change at the quadriceps insertion on the patella. Small plantar calcaneal heel spur at the plantar fascia origin. Ligaments Suboptimally assessed by CT. Muscles and Tendons Mild feathery fatty infiltration of the soleus greater than medial head of the gastrocnemius muscles. No gross tendon tear is seen. Soft tissues There is moderate edema and swelling of the right calf subcutaneous fat, greatest within the anterolateral aspect. Moderate diffuse skin thickening. No walled-off fluid collection is seen to indicate an abscess. IMPRESSION: 1. Moderate edema and swelling of the right calf subcutaneous fat, greatest within the anterolateral aspect. Moderate diffuse skin thickening. Findings are compatible with cellulitis. No walled-off fluid collection  is seen to indicate an abscess. 2. No CT evidence of acute osteomyelitis. 3. 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 No evidence of hardware failure or loosening. Electronically Signed   By: Neita Garnet M.D.   On: 10/24/2022 13:50   Family History Reviewed and non-contributory, no pertinent history of problems with bleeding or anesthesia    Review of Systems No fevers or chills No numbness or tingling No chest pain No shortness of breath No bowel or bladder dysfunction No GI distress No headaches    OBJECTIVE  Vitals:Patient Vitals for the past 8 hrs:  BP Temp Temp src Pulse SpO2  10/26/22 0726 -- -- -- -- 94 %  10/26/22 0501 110/72 98.3 F (36.8 C) Oral 97 94 %   General: Alert, no acute  distress Cardiovascular: Warm extremities noted Respiratory: No cyanosis, no use of accessory musculature GI: No organomegaly, abdomen is soft and non-tender Skin: No lesions in the area of chief complaint other than those listed below in MSK exam.  Neurologic: Sensation intact distally save for the below mentioned MSK exam Psychiatric: Patient is competent for consent with normal mood and affect Lymphatic: No swelling obvious and reported other than the area involved in the exam below Extremities   LUE: Left hand with diffuse swelling on the dorsal aspect of the hand, extending to the forearm.  This is warm to the touch.  There is no fluctuance.  No induration.  Fingers warm and well-perfused.  Sensation is intact throughout the left hand.  Approximately 60 degree arc of motion of the left wrist.  He is able to fully extend his fingers.  Limited flexion of the fingers due to pain.    Test Results Imaging  CT scan of the left hand and forearm demonstrates diffuse soft tissue swelling.  No drainable abscess.  Evidence of arthritis throughout the left hand and wrist.  Labs cbc Recent Labs    10/25/22 0435 10/26/22 0428  WBC 17.6* 15.1*  HGB 15.0 13.6  HCT 47.8 44.2  PLT 234 234     Recent Labs    10/25/22 0435 10/26/22 0428  NA 132* 134*  K 3.7 3.7  CL 93* 92*  CO2 31 29  GLUCOSE 113* 114*  BUN 31* 34*  CREATININE 1.23 1.53*  CALCIUM 8.3* 8.5*

## 2022-10-26 NOTE — Progress Notes (Signed)
Pharmacy Antibiotic Note  Vernon Campbell is a 82 y.o. male presenting with RLE and left hand pain and swelling. Concern for cellulitis.  Pharmacy has been consulted for cefepime and vancomycin dosing for cellulitis.  Wbc 15.1 trending down and afebrile. Patient reports no improvement in left hand. CT from 8/5 consistent with cellulitis without abscess. Ortho following recommend to continue current antibiotic regimen. Scr elevated today at 1.53. Vanc trough 14. Patient had spironolactone and torsemide held today (8/6) for AKI as well.   Plan: Continue cefepime 2g IV q12h  Give vancomycin 1250 mg IV x1 today, dose vancomycin via unstable renal function. Resume scheduled vancomycin dosing upon Scr return to baseline. Vancomycin peak and trough levels for goal AUC 400-550  Monitor renal function, culture results, clinical status and resolution of s/sx infection Narrow abx as able and f/u with appropriate duration    Height: 5' 9.02" (175.3 cm) Weight: 127.5 kg (281 lb 1.4 oz) IBW/kg (Calculated) : 70.74  Temp (24hrs), Avg:98.4 F (36.9 C), Min:98.3 F (36.8 C), Max:98.5 F (36.9 C)  Recent Labs  Lab 10/23/22 1409 10/23/22 1543 10/24/22 0525 10/25/22 0435 10/26/22 0428 10/26/22 1638  WBC 21.1*  --  21.7* 17.6* 15.1*  --   CREATININE 1.17  --  1.13 1.23 1.53*  --   LATICACIDVEN 1.2 0.9  --   --   --   --   VANCOTROUGH  --   --   --   --   --  14*    Estimated Creatinine Clearance: 47.5 mL/min (A) (by C-G formula based on SCr of 1.53 mg/dL (H)).    No Known Allergies  Microbiology results: 8/3 Bcx: NGTD x2  Thank you for allowing pharmacy to be a part of this patient's care.  Jerry Caras, PharmD PGY2 Oncology Pharmacy Resident   10/26/2022 7:00 PM

## 2022-10-27 DIAGNOSIS — L03114 Cellulitis of left upper limb: Secondary | ICD-10-CM | POA: Diagnosis not present

## 2022-10-27 MED ORDER — VANCOMYCIN HCL 1250 MG/250ML IV SOLN
1250.0000 mg | Freq: Once | INTRAVENOUS | Status: AC
  Administered 2022-10-28: 1250 mg via INTRAVENOUS
  Filled 2022-10-27: qty 250

## 2022-10-27 MED ORDER — TORSEMIDE 20 MG PO TABS: 60.0000 mg | ORAL_TABLET | Freq: Every day | ORAL | Status: DC

## 2022-10-27 NOTE — Progress Notes (Signed)
Mobility Specialist Progress Note:    10/27/22 1400  Mobility  Activity Refused mobility   Pt refused mobility. Pt states he has no desire to ambulate during stay. Left in room with visitor, all needs met.   Lawerance Bach Mobility Specialist Please contact via Special educational needs teacher or  Rehab office at [phone]

## 2022-10-27 NOTE — Progress Notes (Signed)
PROGRESS NOTE    Vernon Campbell  ZOX:[id number] DOB: [birth year] DOA: 10/23/2022 PCP: System, Provider Not In   Brief Narrative:    82 y.o. male, with medical history significant for HFpEF, COPD c/b chronic respiratory failure with hypoxia (on baseline 3L [place]), CAD, HTN, obesity-hypoventilation syndrome, HLD, hypothyroidism, OSA and restless leg syndrome with recent hospitalization last month for acute on chronic diastolic CHF, worsening respiratory failure, and COPD exacerbation, and scrotal cellulitis, patient was discharged back to his facility. -Patient was sent by his facility due to complaints of right lower extremity pain, concern for cellulitis, as well left hand pain, patient reports he had an x-ray at the facility which was negative, but patient still reports pain, worsening edema, redness, and both right lower extremity and left hand, at facility he had leg wrap yesterday, but he had significant draining today through the wrap  Assessment & Plan:   Principal Problem:   Cellulitis Active Problems:   Chronic respiratory failure with hypoxia (HCC)   Hyperlipidemia   Hypothyroidism   Obesity, Class III, BMI 40-49.9 (morbid obesity) (HCC)   Chronic stasis dermatitis   COPD (chronic obstructive pulmonary disease) (HCC)   CHF (congestive heart failure) (HCC)   Coronary artery disease involving native coronary artery   Debility   Esophageal reflux   Essential hypertension   Obesity hypoventilation syndrome (HCC)   OSA (obstructive sleep apnea)   Chronic knee pain after total replacement of right knee joint   BPH without urinary obstruction   Mod to Severe Aortic stenosis   Cellulitis of scrotum  Assessment and Plan:   Cellulitis involving multiple sites including right lower extremity, left hand and scrotal area Patient presented with significant right lower extremity erythema with blistering with some drainage from the wound posteriorly. Patient also has swelling of the left  hand. Patient was started on vancomycin and cefepime.   Noted to have significant swelling of the right lower extremity.  CT scan does not show any abscess.  Leg appears to be improving.  Continue antibiotics for now. MRI of the left hand could not be done due to technical issues.  Discussed with the MRI tech and due to his body habitus it will be very difficult to do this test.  In view of this a CT scan of the left hand and forearm was performed which does not show any drainable abscess. Discussed with Dr. Dallas Schimke with orthopedic surgery who has consulted for the left hand cellulitis.  He recommends continuing antibiotics.  Keep the arm elevated.  Warm compresses may help as well. WBC is gradually improving.  Procalcitonin was 0.55.   Elevated creatinine Creatinine noted to be 1.58 this morning from 1.53 yesterday.  He is noted to be on vancomycin.  He is also on torsemide and spironolactone.  These will be held for today.  Torsemide to be held today.  Will repeat blood work tomorrow.   Chronic diastolic CHF Continue with torsemide and Aldactone.  See above.  Some of these medications are being adjusted due to rising creatinine.   Hypothyroidism Continue levothyroxine.   History of COPD/chronic respiratory failure with hypoxia On oxygen at baseline.  Continue inhalers.  Respiratory status appears to be stable.   Restless leg syndrome Continue home medications.   History of coronary artery disease/hyperlipidemia Continue home medications.   Hyponatremia Sodium level is stable.   BPH Continue Flomax.   Class III obesity Estimated body mass index is 41.49 kg/m as calculated from the following:   Height  as of this encounter: 5' 9.02" (1.753 m).   Weight as of this encounter: 127.5 kg.    DVT prophylaxis:Lovenox Code Status: DNR Family Communication: None at bedside Disposition Plan: Continue IV antibiotics, pending SNF placement Status is: Inpatient Remains inpatient  appropriate because: Need for IV antibiotics.   Consultants:  Orthopedics  Procedures:  None  Antimicrobials:  Anti-infectives (From admission, onward)    Start     Dose/Rate Route Frequency Ordered Stop   10/26/22 2000  vancomycin (VANCOREADY) IVPB 1250 mg/250 mL        1,250 mg 166.7 mL/hr over 90 Minutes Intravenous  Once 10/26/22 1855 10/26/22 2114   10/26/22 1834  vancomycin variable dose per unstable renal function (pharmacist dosing)         Does not apply See admin instructions 10/26/22 1835     10/26/22 1800  ceFEPIme (MAXIPIME) 2 g in sodium chloride 0.9 % 100 mL IVPB        2 g 200 mL/hr over 30 Minutes Intravenous Every 12 hours 10/26/22 0831     10/24/22 1600  vancomycin (VANCOREADY) IVPB 1250 mg/250 mL  Status:  Discontinued        1,250 mg 166.7 mL/hr over 90 Minutes Intravenous Every 24 hours 10/23/22 2021 10/26/22 1734   10/23/22 2300  ceFEPIme (MAXIPIME) 2 g in sodium chloride 0.9 % 100 mL IVPB  Status:  Discontinued        2 g 200 mL/hr over 30 Minutes Intravenous Every 8 hours 10/23/22 2021 10/26/22 0831   10/23/22 1500  vancomycin (VANCOREADY) IVPB 2000 mg/400 mL        2,000 mg 200 mL/hr over 120 Minutes Intravenous  Once 10/23/22 1447 10/23/22 1930   10/23/22 1445  vancomycin (VANCOCIN) IVPB 1000 mg/200 mL premix  Status:  Discontinued        1,000 mg 200 mL/hr over 60 Minutes Intravenous  Once 10/23/22 1438 10/23/22 1447   10/23/22 1445  ceFEPIme (MAXIPIME) 2 g in sodium chloride 0.9 % 100 mL IVPB        2 g 200 mL/hr over 30 Minutes Intravenous  Once 10/23/22 1438 10/23/22 1533      Subjective: Patient seen and evaluated today with ongoing pain to his left hand with inability to flex his fingers.  Objective: Vitals:   10/26/22 1222 10/26/22 2003 10/26/22 2010 10/27/22 0334  BP: 110/71 112/72  109/60  Pulse: 92 97  95  Resp: (!) 24 18  18   Temp: 98.4 F (36.9 C) 98.7 F (37.1 C)  98.4 F (36.9 C)  TempSrc: Oral Oral  Oral  SpO2: 97% 99% 98%  96%  Weight:      Height:        Intake/Output Summary (Last 24 hours) at 10/27/2022 0656 Last data filed at 10/27/2022 0602 Gross per 24 hour  Intake 572.72 ml  Output 1650 ml  Net -1077.28 ml   Filed Weights   10/23/22 1304 10/23/22 2049  Weight: 127.5 kg 127.5 kg    Examination:  General exam: Appears calm and comfortable  Respiratory system: Clear to auscultation. Respiratory effort normal. Cardiovascular system: S1 & S2 heard, RRR.  Gastrointestinal system: Abdomen is soft Central nervous system: Alert and awake Extremities: Erythema and edema to left hand as well as right lower extremity with dressings C/D/I Skin: No significant lesions noted Psychiatry: Flat affect.    Data Reviewed: I have personally reviewed following labs and imaging studies  CBC: Recent Labs  Lab 10/23/22 1409 10/24/22  6073 10/25/22 0435 10/26/22 0428 10/27/22 0425  WBC 21.1* 21.7* 17.6* 15.1* 11.9*  NEUTROABS 18.5*  --   --   --   --   HGB 16.4 14.4 15.0 13.6 13.6  HCT 51.8 46.4 47.8 44.2 44.5  MCV 99.6 100.9* 100.6* 100.5* 101.4*  PLT 209 217 234 234 230   Basic Metabolic Panel: Recent Labs  Lab 10/23/22 1409 10/24/22 0525 10/25/22 0435 10/26/22 0428 10/27/22 0425  NA 131* 133* 132* 134* 135  K 4.2 3.8 3.7 3.7 3.8  CL 92* 92* 93* 92* 95*  CO2 31 30 31 29 30   GLUCOSE 120* 134* 113* 114* 119*  BUN 32* 29* 31* 34* 36*  CREATININE 1.17 1.13 1.23 1.53* 1.58*  CALCIUM 8.7* 8.6* 8.3* 8.5* 8.7*   GFR: Estimated Creatinine Clearance: 46 mL/min (A) (by C-G formula based on SCr of 1.58 mg/dL (H)). Liver Function Tests: Recent Labs  Lab 10/23/22 1409  AST 20  ALT 20  ALKPHOS 158*  BILITOT 2.9*  PROT 7.1  ALBUMIN 3.2*   No results for input(s): "LIPASE", "AMYLASE" in the last 168 hours. No results for input(s): "AMMONIA" in the last 168 hours. Coagulation Profile: No results for input(s): "INR", "PROTIME" in the last 168 hours. Cardiac Enzymes: No results for input(s):  "CKTOTAL", "CKMB", "CKMBINDEX", "TROPONINI" in the last 168 hours. BNP (last 3 results) No results for input(s): "PROBNP" in the last 8760 hours. HbA1C: No results for input(s): "HGBA1C" in the last 72 hours. CBG: No results for input(s): "GLUCAP" in the last 168 hours. Lipid Profile: No results for input(s): "CHOL", "HDL", "LDLCALC", "TRIG", "CHOLHDL", "LDLDIRECT" in the last 72 hours. Thyroid Function Tests: No results for input(s): "TSH", "T4TOTAL", "FREET4", "T3FREE", "THYROIDAB" in the last 72 hours. Anemia Panel: No results for input(s): "VITAMINB12", "FOLATE", "FERRITIN", "TIBC", "IRON", "RETICCTPCT" in the last 72 hours. Sepsis Labs: Recent Labs  Lab 10/23/22 1409 10/23/22 1543 10/25/22 0435  PROCALCITON  --   --  0.55  LATICACIDVEN 1.2 0.9  --     Recent Results (from the past 240 hour(s))  Blood culture (routine x 2)     Status: None (Preliminary result)   Collection Time: 10/23/22  2:09 PM   Specimen: Right Antecubital; Blood  Result Value Ref Range Status   Specimen Description   Final    RIGHT ANTECUBITAL BOTTLES DRAWN AEROBIC AND ANAEROBIC   Special Requests Blood Culture adequate volume  Final   Culture   Final    NO GROWTH 2 DAYS Performed at [facility], [address] [place], [place] [address]    Report Status PENDING  Incomplete  Blood culture (routine x 2)     Status: None (Preliminary result)   Collection Time: 10/23/22  2:09 PM   Specimen: Left Antecubital; Blood  Result Value Ref Range Status   Specimen Description   Final    LEFT ANTECUBITAL BOTTLES DRAWN AEROBIC AND ANAEROBIC   Special Requests Blood Culture adequate volume  Final   Culture   Final    NO GROWTH 2 DAYS Performed at [facility], [address] [place], [place] [address]    Report Status PENDING  Incomplete         Radiology Studies: CT HAND LEFT W CONTRAST  Result Date: 10/25/2022 CLINICAL DATA:  Chronic hand pain. Forearm and hand swelling. Inflammation and redness.  Evaluate for cellulitis. Inflammatory arthritis suspected. EXAM: CT OF THE UPPER LEFT EXTREMITY WITH CONTRAST TECHNIQUE: Multidetector CT imaging of the left forearm and left hand  was performed according to the standard protocol following intravenous contrast administration. RADIATION DOSE REDUCTION: This exam was performed according to the departmental dose-optimization program which includes automated exposure control, adjustment of the mA and/or kV according to patient size and/or use of iterative reconstruction technique. CONTRAST:  75mL OMNIPAQUE IOHEXOL 300 MG/ML  SOLN COMPARISON:  None Available. FINDINGS: Left forearm: Bones/Joint/Cartilage There is a 5 mm subchondral cysts versus erosion at the distal medial volar aspect of the radius (coronal series 6, image 44). Additional 2 mm subchondral cyst versus erosion within the radial styloid (coronal series 6, image 47). No acute fracture or dislocation within the radius or ulna. Ligaments Suboptimally assessed by CT. Muscles and Tendons Normal size and density of the regional forearm musculature. Soft tissues Mild edema and swelling of the subcutaneous fat of the dorsal ulnar aspect of the mid to distal forearm. No walled-off abscess is seen. ---- Left hand: Bones/Joint/Cartilage There are lucent cysts versus erosions seen at the distal dorsal radius, adjacent proximal lunate, mid to distal, lateral aspect of the scaphoid, proximal triquetrum, and minimally at the proximal medial aspect of the trapezium. 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 Moderate peripheral medial and lateral second metacarpophalangeal lucent likely erosions on both sides of the joint. Moderate dorsal medial and dorsal lateral third and fifth metacarpal head cortical erosions. Scattered mild-to-moderate lucent marginal cysts versus erosions throughout the second through fifth interphalangeal joints. Generally moderate second  through fifth DIP and mild-to-moderate second through fifth PIP joint space narrowing. Ligaments No gross tendon tear is visualized. Normal size and density of the regional musculature. Muscles and Tendons No gross tendon tear is seen. Soft tissues Mild diffuse hand soft tissue swelling. Moderate dorsal subcutaneous fat edema and swelling at the level of the metacarpal heads. No walled-off abscess is seen. IMPRESSION: 1. Scattered lucent cysts versus erosions throughout the left hand and wrist as above. At the level of the metacarpophalangeal joints, findings appear to represent marginal erosions. The radiocarpal and intercarpal lucencies also may represent erosions. Findings are compatible with an inflammatory arthropathy such as rheumatoid arthritis. 2. Generally moderate second through fifth DIP and mild-to-moderate second through fifth PIP joint space narrowing. 3. Moderate dorsal subcutaneous fat edema and swelling at the level of the metacarpal heads. 4. Mild edema and swelling of the subcutaneous fat of the dorsal ulnar aspect of the mid to distal forearm. 5. The soft tissue swelling is compatible with cellulitis of the hand greater than forearm. No walled-off abscess is seen. Electronically Signed   By: Neita Garnet M.D.   On: 10/25/2022 17:25   CT FOREARM LEFT W CONTRAST  Result Date: 10/25/2022 CLINICAL DATA:  Chronic hand pain. Forearm and hand swelling. Inflammation and redness. Evaluate for cellulitis. Inflammatory arthritis suspected. EXAM: CT OF THE UPPER LEFT EXTREMITY WITH CONTRAST TECHNIQUE: Multidetector CT imaging of the left forearm and left hand was performed according to the standard protocol following intravenous contrast administration. RADIATION DOSE REDUCTION: This exam was performed according to the departmental dose-optimization program which includes automated exposure control, adjustment of the mA and/or kV according to patient size and/or use of iterative reconstruction technique.  CONTRAST:  75mL OMNIPAQUE IOHEXOL 300 MG/ML  SOLN COMPARISON:  None Available. FINDINGS: Left forearm: Bones/Joint/Cartilage There is a 5 mm subchondral cysts versus erosion at the distal medial volar aspect of the radius (coronal series 6, image 44). Additional 2 mm subchondral cyst versus erosion within the radial styloid (coronal series  6, image 47). No acute fracture or dislocation within the radius or ulna. Ligaments Suboptimally assessed by CT. Muscles and Tendons Normal size and density of the regional forearm musculature. Soft tissues Mild edema and swelling of the subcutaneous fat of the dorsal ulnar aspect of the mid to distal forearm. No walled-off abscess is seen. ---- Left hand: Bones/Joint/Cartilage There are lucent cysts versus erosions seen at the distal dorsal radius, adjacent proximal lunate, mid to distal, lateral aspect of the scaphoid, proximal triquetrum, and minimally at the proximal medial aspect of the trapezium. 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 Moderate peripheral medial and lateral second metacarpophalangeal lucent likely erosions on both sides of the joint. Moderate dorsal medial and dorsal lateral third and fifth metacarpal head cortical erosions. Scattered mild-to-moderate lucent marginal cysts versus erosions throughout the second through fifth interphalangeal joints. Generally moderate second through fifth DIP and mild-to-moderate second through fifth PIP joint space narrowing. Ligaments No gross tendon tear is visualized. Normal size and density of the regional musculature. Muscles and Tendons No gross tendon tear is seen. Soft tissues Mild diffuse hand soft tissue swelling. Moderate dorsal subcutaneous fat edema and swelling at the level of the metacarpal heads. No walled-off abscess is seen. IMPRESSION: 1. Scattered lucent cysts versus erosions throughout the left hand and wrist as above. At the level of the  metacarpophalangeal joints, findings appear to represent marginal erosions. The radiocarpal and intercarpal lucencies also may represent erosions. Findings are compatible with an inflammatory arthropathy such as rheumatoid arthritis. 2. Generally moderate second through fifth DIP and mild-to-moderate second through fifth PIP joint space narrowing. 3. Moderate dorsal subcutaneous fat edema and swelling at the level of the metacarpal heads. 4. Mild edema and swelling of the subcutaneous fat of the dorsal ulnar aspect of the mid to distal forearm. 5. The soft tissue swelling is compatible with cellulitis of the hand greater than forearm. No walled-off abscess is seen. Electronically Signed   By: Neita Garnet M.D.   On: 10/25/2022 17:25        Scheduled Meds:  atorvastatin  20 mg Oral QPM   enoxaparin (LOVENOX) injection  60 mg Subcutaneous Q24H   fluticasone furoate-vilanterol  1 puff Inhalation Daily   ipratropium-albuterol  3 mL Nebulization BID   lactulose  10 g Oral Daily   levothyroxine  175 mcg Oral Q0600   liver oil-zinc oxide   Topical TID   nystatin   Topical TID   polyethylene glycol  17 g Oral Daily   potassium chloride SA  20 mEq Oral Daily   pramipexole  1 mg Oral QHS   senna  17.2 mg Oral BID   tamsulosin  0.4 mg Oral QPC supper   [START ON 10/28/2022] torsemide  60 mg Oral Daily   vancomycin variable dose per unstable renal function (pharmacist dosing)   Does not apply See admin instructions   Continuous Infusions:  ceFEPime (MAXIPIME) IV Stopped (10/27/22 0602)     LOS: 4 days    Time spent: 35 minutes     Hoover Brunette, DO [facility]  If 7PM-7AM, please contact night-coverage [email or web address] 10/27/2022, 6:56 AM

## 2022-10-27 NOTE — Progress Notes (Signed)
   10/27/22 1506  Spiritual Encounters  Type of Visit Initial  Care provided to: Patient  Referral source Patient request  Reason for visit Routine spiritual support  OnCall Visit No  Spiritual Framework  Presenting Themes Meaning/purpose/sources of inspiration;Impactful experiences and emotions;Courage hope and growth;Rituals and practive;Significant life change;Values and beliefs  Community/Connection Family  Patient Stress Factors Loss of control;Major life changes  Family Stress Factors None identified  Interventions  Spiritual Care Interventions Made Established relationship of care and support;Compassionate presence;Reflective listening;Narrative/life review;Meaning making;Prayer  Intervention Outcomes  Outcomes Connection to spiritual care;Awareness of support;Reduced anxiety;Awareness around self/spiritual resourses;Autonomy/agency  Spiritual Care Plan  Spiritual Care Issues Still Outstanding Chaplain will continue to follow   Referred to patient through Nursing by patient. Found Mr. Covalt laying upright in hospital bed today and he welcomed Chaplain warmly. 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 He asked that this writer help him in writing a letter to his son outlining tasks for his funeral. This Clinical research associate did so at his request. Chaplain provided space for reflection, prayer, and opportunity for meaning making. Chaplain will remain available in order to provide spiritual support and to assess for spiritual need.   Rev. Jolyn Lent, M.Div Chaplain

## 2022-10-27 NOTE — Plan of Care (Signed)
  Problem: Education: Goal: Knowledge of General Education information will improve Description: Including pain rating scale, medication(s)/side effects and non-pharmacologic comfort measures Outcome: Progressing   Problem: Health Behavior/Discharge Planning: Goal: Ability to manage health-related needs will improve Outcome: Not Progressing   Problem: Clinical Measurements: Goal: Ability to maintain clinical measurements within normal limits will improve Outcome: Progressing   Problem: Clinical Measurements: Goal: Respiratory complications will improve Outcome: Not Progressing   Problem: Activity: Goal: Risk for activity intolerance will decrease Outcome: Not Progressing

## 2022-10-27 NOTE — Progress Notes (Signed)
OT Cancellation Note  Patient Details Name: Vernon Campbell MRN: [id number] DOB: April 09, 1940   Cancelled Treatment:    Reason Eval/Treat Not Completed: Fatigue/lethargy limiting ability to participate;Other (comment). Pt declined OT evaluation due to already having sat up this morning in bed. Will attempt evaluation later as time permits.     OT, MOT   Danie Chandler 10/27/2022, 8:44 AM

## 2022-10-27 NOTE — Plan of Care (Signed)

## 2022-10-28 DIAGNOSIS — L03114 Cellulitis of left upper limb: Secondary | ICD-10-CM | POA: Diagnosis not present

## 2022-10-28 LAB — CULTURE, BLOOD (ROUTINE X 2)
Culture: NO GROWTH
Culture: NO GROWTH
Special Requests: ADEQUATE
Special Requests: ADEQUATE

## 2022-10-28 MED ORDER — TORSEMIDE 20 MG PO TABS: 40.0000 mg | ORAL_TABLET | Freq: Every day | ORAL | Status: DC

## 2022-10-28 MED ORDER — SPIRONOLACTONE 25 MG PO TABS: 50.0000 mg | ORAL_TABLET | Freq: Every day | ORAL | Status: DC

## 2022-10-28 MED ORDER — PRAMIPEXOLE DIHYDROCHLORIDE 1 MG PO TABS: 1.0000 mg | ORAL_TABLET | Freq: Every day | ORAL | 0 refills | Status: DC

## 2022-10-28 MED ORDER — HYDROCODONE-ACETAMINOPHEN 5-325 MG PO TABS: 1.0000 | ORAL_TABLET | Freq: Three times a day (TID) | ORAL | 0 refills | Status: DC | PRN

## 2022-10-28 MED ORDER — DOXYCYCLINE HYCLATE 100 MG PO TABS: 100.0000 mg | ORAL_TABLET | Freq: Two times a day (BID) | ORAL | 0 refills | Status: AC

## 2022-10-28 NOTE — Progress Notes (Signed)
Patient slept some during the night. Dressing changed to right lower leg. Prn medication given x2 during this shift. Reminded patient twice to keep his oxygen nasal cannula in his nose. Plan of care ongoing.

## 2022-10-28 NOTE — TOC Transition Note (Signed)
Transition of Care [facility]) - CM/SW Discharge Note   Patient Details  Name: Vernon Campbell MRN: [id number] Date of Birth: Aug 21, 1940  Transition of Care [facility]) CM/SW Contact:  Karn Cassis, LCSW Phone Number: 10/28/2022, 10:48 AM   Clinical Narrative:  Pt d/c today back to [facility]. Pt, pt's son, and facility aware and agreeable. Pt requesting [facility] transport. LCSW to arrange. RN given number to call report. D/C summary sent to SNF.      Final next level of care: Skilled Nursing Facility Barriers to Discharge: Barriers Resolved   Patient Goals and CMS Choice      Discharge Placement                  Patient to be transferred to facility by: [facility] Name of family member notified: Son Patient and family notified of of transfer: 10/28/22  Discharge Plan and Services Additional resources added to the After Visit Summary for                                       Social Determinants of Health (SDOH) Interventions SDOH Screenings   Food Insecurity: No Food Insecurity (10/23/2022)  Housing: Low Risk  (10/23/2022)  Transportation Needs: No Transportation Needs (10/23/2022)  Utilities: Not At Risk (10/23/2022)  Social Connections: Unknown (07/31/2021)   Received from [facility]  Tobacco Use: Medium Risk (10/23/2022)     Readmission Risk Interventions    10/24/2022    3:34 PM 09/28/2022    2:05 PM 12/14/2021   12:46 PM  Readmission Risk Prevention Plan  Transportation Screening Complete Complete Complete  PCP or Specialist Appt within 3-5 Days Complete Complete   HRI or Home Care Consult Complete Complete Complete  Social Work Consult for Recovery Care Planning/Counseling Complete Complete Complete  Palliative Care Screening Complete Not Applicable Not Applicable  Medication Review Oceanographer) Complete Complete Complete

## 2022-10-28 NOTE — Discharge Summary (Signed)
Physician Discharge Summary  Vernon Campbell WJX:[id number] DOB: 16-Apr-1940 DOA: 10/23/2022  PCP: System, Provider Not In  Admit date: 10/23/2022  Discharge date: 10/28/2022  Admitted From:SNF  Disposition:  SNF  Recommendations for Outpatient Follow-up:  Follow up with PCP in 1-2 weeks Remain on doxycycline as prescribed for 9 more days for total 14-day course of treatment for extensive cellulitis Continue wound care Continue other home medications as prior  Home Health: None  Equipment/Devices: None  Discharge Condition:Stable  CODE STATUS: DNR  Diet recommendation: Heart Healthy  Brief/Interim Summary: 82 y.o. male, with medical history significant for HFpEF, COPD c/b chronic respiratory failure with hypoxia (on baseline 3L [place]), CAD, HTN, obesity-hypoventilation syndrome, HLD, hypothyroidism, OSA and restless leg syndrome with recent hospitalization last month for acute on chronic diastolic CHF, worsening respiratory failure, and COPD exacerbation, and scrotal cellulitis, patient was discharged back to his facility. -Patient was sent by his facility due to complaints of right lower extremity pain, concern for cellulitis, as well left hand pain, patient reports he had an x-ray at the facility which was negative, but patient still reports pain, worsening edema, redness, and both right lower extremity and left hand, at facility he had leg wrap yesterday, but he had significant draining today through the wrap.  Patient was readmitted for treatment of cellulitis involving multiple sites including right lower extremity as well as left hand and scrotal area.  He was seen by orthopedics for left hand cellulitis with recommendations to continue antibiotics.  He is noted to have gradual improvement in his cellulitis condition and will need to remain on doxycycline as prescribed for 9 more days to complete total 14-day course of treatment.  He continues to remain a high risk for readmission and may  require future ID evaluation consultation if cellulitis continues to persist.  No other acute events or concerns noted.  Discharge Diagnoses:  Principal Problem:   Cellulitis Active Problems:   Chronic respiratory failure with hypoxia (HCC)   Hyperlipidemia   Hypothyroidism   Obesity, Class III, BMI 40-49.9 (morbid obesity) (HCC)   Chronic stasis dermatitis   COPD (chronic obstructive pulmonary disease) (HCC)   CHF (congestive heart failure) (HCC)   Coronary artery disease involving native coronary artery   Debility   Esophageal reflux   Essential hypertension   Obesity hypoventilation syndrome (HCC)   OSA (obstructive sleep apnea)   Chronic knee pain after total replacement of right knee joint   BPH without urinary obstruction   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  Discharge Instructions  Discharge Instructions     Diet - low sodium heart healthy   Complete by: As directed    Discharge wound care:   Complete by: As directed    Daily      Comments: Wound care to Right LE open areas:  Cleanse with NS, pat dry. Cover with folded layers of xeroform gauze [place] # [id number]), top with ABD pads and secure with Kerlix roll gauze/paper tape. Place feet into Prevalon boots.   Increase activity slowly   Complete by: As directed       Allergies as of 10/28/2022   No Known Allergies      Medication List     TAKE these medications    albuterol (2.5 MG/3ML) 0.083% nebulizer solution Commonly known as: PROVENTIL Take 3 mLs (2.5 mg total) by nebulization every 2 (two)  hours as needed for shortness of breath.   atorvastatin 20 MG tablet Commonly known as: LIPITOR Take 20 mg by mouth every evening.   bacitracin 500 UNIT/GM ointment Apply 1 Application topically 2 (two) times daily.   doxycycline 100 MG tablet Commonly  known as: VIBRA-TABS Take 1 tablet (100 mg total) by mouth 2 (two) times daily for 9 days.   fluticasone furoate-vilanterol 100-25 MCG/ACT Aepb Commonly known as: BREO ELLIPTA Inhale 1 puff into the lungs daily.   HYDROcodone-acetaminophen 5-325 MG tablet Commonly known as: NORCO/VICODIN Take 1 tablet by mouth every 8 (eight) hours as needed for moderate pain.   ketoconazole 2 % shampoo Commonly known as: NIZORAL Apply 1 application  topically 2 (two) times a week. Apply to scalp (shampoo) topically during day shift every Tuesday and Friday for tinea versicolor (bath days)   lactulose 10 GM/15ML solution Commonly known as: CHRONULAC Take 15 mLs by mouth daily.   levothyroxine 175 MCG tablet Commonly known as: SYNTHROID Take 175 mcg by mouth daily before breakfast.   OXYGEN Inhale 2 L/min into the lungs continuous.   polyethylene glycol 17 g packet Commonly known as: MIRALAX / GLYCOLAX Take 17 g by mouth daily.   potassium chloride SA 20 MEQ tablet Commonly known as: KLOR-CON M Take 20 mEq by mouth daily.   pramipexole 1 MG tablet Commonly known as: MIRAPEX Take 1 tablet (1 mg total) by mouth at bedtime.   senna 8.6 MG Tabs tablet Commonly known as: SENOKOT Take 17.2 mg by mouth 2 (two) times daily.   spironolactone 50 MG tablet Commonly known as: ALDACTONE Take 1 tablet (50 mg total) by mouth daily.   tamsulosin 0.4 MG Caps capsule Commonly known as: FLOMAX Take 1 capsule (0.4 mg total) by mouth daily after supper. What changed: when to take this   Torsemide 40 MG Tabs Take 40 mg by mouth See admin instructions. Take 60 mg every morning and 40 mg every evening               Discharge Care Instructions  (From admission, onward)           Start     Ordered   10/28/22 0000  Discharge wound care:       Comments: Daily      Comments: Wound care to Right LE open areas:  Cleanse with NS, pat dry. Cover with folded layers of xeroform gauze [place] #  [id number]), top with ABD pads and secure with Kerlix roll gauze/paper tape. Place feet into Prevalon boots.   10/28/22 1011            No Known Allergies  Consultations: Orthopedics   Procedures/Studies: CT HAND LEFT W CONTRAST  Result Date: 10/25/2022 CLINICAL DATA:  Chronic hand pain. Forearm and hand swelling. Inflammation and redness. Evaluate for cellulitis. Inflammatory arthritis suspected. EXAM: CT OF THE UPPER LEFT EXTREMITY WITH CONTRAST TECHNIQUE: Multidetector CT imaging of the left forearm and left hand was performed according to the standard protocol following intravenous contrast administration. RADIATION DOSE REDUCTION: This exam was performed according to the departmental dose-optimization program which includes automated exposure control, adjustment of the mA and/or kV according to patient size and/or use of iterative reconstruction technique. CONTRAST:  75mL OMNIPAQUE IOHEXOL 300 MG/ML  SOLN COMPARISON:  None Available. FINDINGS: Left forearm: Bones/Joint/Cartilage There is a 5 mm subchondral cysts versus erosion at the distal medial volar aspect of the radius (coronal series 6, image 44). Additional 2 mm subchondral cyst versus erosion  within the radial styloid (coronal series 6, image 47). No acute fracture or dislocation within the radius or ulna. Ligaments Suboptimally assessed by CT. Muscles and Tendons Normal size and density of the regional forearm musculature. Soft tissues Mild edema and swelling of the subcutaneous fat of the dorsal ulnar aspect of the mid to distal forearm. No walled-off abscess is seen. ---- Left hand: Bones/Joint/Cartilage There are lucent cysts versus erosions seen at the distal dorsal radius, adjacent proximal lunate, mid to distal, lateral aspect of the scaphoid, proximal triquetrum, and minimally at the proximal medial aspect of the trapezium. 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 Moderate  peripheral medial and lateral second metacarpophalangeal lucent likely erosions on both sides of the joint. Moderate dorsal medial and dorsal lateral third and fifth metacarpal head cortical erosions. Scattered mild-to-moderate lucent marginal cysts versus erosions throughout the second through fifth interphalangeal joints. Generally moderate second through fifth DIP and mild-to-moderate second through fifth PIP joint space narrowing. Ligaments No gross tendon tear is visualized. Normal size and density of the regional musculature. Muscles and Tendons No gross tendon tear is seen. Soft tissues Mild diffuse hand soft tissue swelling. Moderate dorsal subcutaneous fat edema and swelling at the level of the metacarpal heads. No walled-off abscess is seen. IMPRESSION: 1. Scattered lucent cysts versus erosions throughout the left hand and wrist as above. At the level of the metacarpophalangeal joints, findings appear to represent marginal erosions. The radiocarpal and intercarpal lucencies also may represent erosions. Findings are compatible with an inflammatory arthropathy such as rheumatoid arthritis. 2. Generally moderate second through fifth DIP and mild-to-moderate second through fifth PIP joint space narrowing. 3. Moderate dorsal subcutaneous fat edema and swelling at the level of the metacarpal heads. 4. Mild edema and swelling of the subcutaneous fat of the dorsal ulnar aspect of the mid to distal forearm. 5. The soft tissue swelling is compatible with cellulitis of the hand greater than forearm. No walled-off abscess is seen. Electronically Signed   By: Neita Garnet M.D.   On: 10/25/2022 17:25   CT FOREARM LEFT W CONTRAST  Result Date: 10/25/2022 CLINICAL DATA:  Chronic hand pain. Forearm and hand swelling. Inflammation and redness. Evaluate for cellulitis. Inflammatory arthritis suspected. EXAM: CT OF THE UPPER LEFT EXTREMITY WITH CONTRAST TECHNIQUE: Multidetector CT imaging of the left forearm and left hand  was performed according to the standard protocol following intravenous contrast administration. RADIATION DOSE REDUCTION: This exam was performed according to the departmental dose-optimization program which includes automated exposure control, adjustment of the mA and/or kV according to patient size and/or use of iterative reconstruction technique. CONTRAST:  75mL OMNIPAQUE IOHEXOL 300 MG/ML  SOLN COMPARISON:  None Available. FINDINGS: Left forearm: Bones/Joint/Cartilage There is a 5 mm subchondral cysts versus erosion at the distal medial volar aspect of the radius (coronal series 6, image 44). Additional 2 mm subchondral cyst versus erosion within the radial styloid (coronal series 6, image 47). No acute fracture or dislocation within the radius or ulna. Ligaments Suboptimally assessed by CT. Muscles and Tendons Normal size and density of the regional forearm musculature. Soft tissues Mild edema and swelling of the subcutaneous fat of the dorsal ulnar aspect of the mid to distal forearm. No walled-off abscess is seen. ---- Left hand: Bones/Joint/Cartilage There are lucent cysts versus erosions seen at the distal dorsal radius, adjacent proximal lunate, mid to distal, lateral aspect of the scaphoid, proximal triquetrum, and minimally at the proximal medial aspect  of the trapezium. 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 Moderate peripheral medial and lateral second metacarpophalangeal lucent likely erosions on both sides of the joint. Moderate dorsal medial and dorsal lateral third and fifth metacarpal head cortical erosions. Scattered mild-to-moderate lucent marginal cysts versus erosions throughout the second through fifth interphalangeal joints. Generally moderate second through fifth DIP and mild-to-moderate second through fifth PIP joint space narrowing. Ligaments No gross tendon tear is visualized. Normal size and density of the regional  musculature. Muscles and Tendons No gross tendon tear is seen. Soft tissues Mild diffuse hand soft tissue swelling. Moderate dorsal subcutaneous fat edema and swelling at the level of the metacarpal heads. No walled-off abscess is seen. IMPRESSION: 1. Scattered lucent cysts versus erosions throughout the left hand and wrist as above. At the level of the metacarpophalangeal joints, findings appear to represent marginal erosions. The radiocarpal and intercarpal lucencies also may represent erosions. Findings are compatible with an inflammatory arthropathy such as rheumatoid arthritis. 2. Generally moderate second through fifth DIP and mild-to-moderate second through fifth PIP joint space narrowing. 3. Moderate dorsal subcutaneous fat edema and swelling at the level of the metacarpal heads. 4. Mild edema and swelling of the subcutaneous fat of the dorsal ulnar aspect of the mid to distal forearm. 5. The soft tissue swelling is compatible with cellulitis of the hand greater than forearm. No walled-off abscess is seen. Electronically Signed   By: Neita Garnet M.D.   On: 10/25/2022 17:25   CT TIBIA FIBULA RIGHT W CONTRAST  Result Date: 10/24/2022 CLINICAL DATA:  Soft tissue infection suspected. Right lower extremity pain. Concern for cellulitis. EXAM: CT OF THE LOWER RIGHT EXTREMITY WITH CONTRAST TECHNIQUE: Multidetector CT imaging of the lower right extremity was performed according to the standard protocol following intravenous contrast administration. RADIATION DOSE REDUCTION: This exam was performed according to the departmental dose-optimization program which includes automated exposure control, adjustment of the mA and/or kV according to patient size and/or use of iterative reconstruction technique. CONTRAST:  OMNIPAQUE IOHEXOL 300 MG/ML  SOLN COMPARISON:  Right knee and right tibia and fibula radiographs 12/08/2020 FINDINGS: Bones/Joint/Cartilage Postsurgical changes are again seen of total right knee  arthroplasty. Within the limitations of streak artifact, no perihardware lucency is seen to indicate hardware failure or loosening. There is diffuse decreased bone mineralization. The cortices are intact. No cortical erosion to indicate CT evidence of acute osteomyelitis. No acute fracture or dislocation. Mild chronic enthesopathic change at the quadriceps insertion on the patella. Small plantar calcaneal heel spur at the plantar fascia origin. Ligaments Suboptimally assessed by CT. Muscles and Tendons Mild feathery fatty infiltration of the soleus greater than medial head of the gastrocnemius muscles. No gross tendon tear is seen. Soft tissues There is moderate edema and swelling of the right calf subcutaneous fat, greatest within the anterolateral aspect. Moderate diffuse skin thickening. No walled-off fluid collection is seen to indicate an abscess. IMPRESSION: 1. Moderate edema and swelling of the right calf subcutaneous fat, greatest within the anterolateral aspect. Moderate diffuse skin thickening. Findings are compatible with cellulitis. No walled-off fluid collection is seen to indicate an abscess. 2. No CT evidence of acute osteomyelitis. 3. 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 No evidence of hardware failure or loosening. Electronically Signed   By: Neita Garnet M.D.   On: 10/24/2022 13:50   DG Chest Port 1 View  Result Date: 10/23/2022 CLINICAL DATA:  Hypoxia EXAM: PORTABLE CHEST 1 VIEW  COMPARISON:  09/29/2022 FINDINGS: Lung volumes are small and pulmonary insufflation has diminished since prior examination. Stable superimposed left basilar consolidation. Possible small left pleural effusion again noted. No pneumothorax. Right lung is clear. Cardiac size is mildly enlarged, stable. Pulmonary vascularity is normal. No acute bone abnormality. IMPRESSION: 1. Interval decrease in pulmonary insufflation. 2. Stable left basilar consolidation.  Small left pleural effusion. 3. Stable mild  cardiomegaly. Electronically Signed   By: Helyn Numbers M.D.   On: 10/23/2022 21:24   DG Hand 2 View Left  Result Date: 10/23/2022 CLINICAL DATA:  Left hand cellulitis EXAM: LEFT HAND - 2 VIEW COMPARISON:  None Available. FINDINGS: Widening of the scapholunate interval in keeping with disruption of the scapholunate ligament. Otherwise normal alignment. No acute fracture or dislocation. No osseous erosion or abnormal periosteal reaction. Joint spaces are preserved. Diffuse soft tissue swelling of the visualized left wrist and hand. No subcutaneous gas or retained radiopaque foreign body identified. IMPRESSION: 1. Diffuse soft tissue swelling of the visualized left wrist and hand. No subcutaneous gas or retained radiopaque foreign body identified. 2. Scapholunate ligament disruption. Electronically Signed   By: Helyn Numbers M.D.   On: 10/23/2022 21:22   DG Chest Port 1 View  Result Date: 09/29/2022 CLINICAL DATA:  82 year old male with history of heart failure with preserved ejection fraction. History of COPD. Hypertension. EXAM: PORTABLE CHEST 1 VIEW COMPARISON:  Chest x-ray 09/27/2022. FINDINGS: Elevation of the left hemidiaphragm unchanged. Opacity at the left base increasing compared to the prior study, which may reflect evolving atelectasis and/or consolidation, likely with superimposed small left pleural effusion. No pneumothorax. There is cephalization of the pulmonary vasculature and slight indistinctness of the interstitial markings suggestive of mild pulmonary edema. Mild cardiomegaly. The patient is rotated to the right on today's exam, resulting in distortion of the mediastinal contours and reduced diagnostic sensitivity and specificity for mediastinal pathology. IMPRESSION: 1. The appearance of the chest suggests mild congestive heart failure, as above. 2. Worsening aeration at the left lung base which may reflect increasing areas of atelectasis and/or consolidation, along with superimposed  small left pleural effusion. 3. Elevation of the left hemidiaphragm again noted. Electronically Signed   By: Trudie Reed M.D.   On: 09/29/2022 05:45     Discharge Exam: Vitals:   10/28/22 0832 10/28/22 0843  BP:    Pulse: 82 82  Resp: 20   Temp:    SpO2: 92% 92%   Vitals:   10/28/22 0412 10/28/22 0456 10/28/22 0832 10/28/22 0843  BP: (!) 127/104 122/74    Pulse: 94  82 82  Resp: 18  20   Temp: 98 F (36.7 C)     TempSrc:      SpO2: 94%  92% 92%  Weight:      Height:        General: Pt is alert, awake, not in acute distress Cardiovascular: RRR, S1/S2 +, no rubs, no gallops Respiratory: CTA bilaterally, no wheezing, no rhonchi, [place] Abdominal: Soft, NT, ND, bowel sounds + Extremities: Left upper extremity and right lower extremity edema with erythema noted, improving.    The results of significant diagnostics from this hospitalization (including imaging, microbiology, ancillary and laboratory) are listed below for reference.     Microbiology: Recent Results (from the past 240 hour(s))  Blood culture (routine x 2)     Status: None   Collection Time: 10/23/22  2:09 PM   Specimen: Right Antecubital; Blood  Result Value Ref Range Status   Specimen  Description   Final    RIGHT ANTECUBITAL BOTTLES DRAWN AEROBIC AND ANAEROBIC   Special Requests Blood Culture adequate volume  Final   Culture   Final    NO GROWTH 5 DAYS Performed at [facility], [address], [place], [place] [address]    Report Status 10/28/2022 FINAL  Final  Blood culture (routine x 2)     Status: None   Collection Time: 10/23/22  2:09 PM   Specimen: Left Antecubital; Blood  Result Value Ref Range Status   Specimen Description   Final    LEFT ANTECUBITAL BOTTLES DRAWN AEROBIC AND ANAEROBIC   Special Requests Blood Culture adequate volume  Final   Culture   Final    NO GROWTH 5 DAYS Performed at [facility], [address], [place], [place] [address]    Report Status 10/28/2022 FINAL  Final      Labs: BNP (last 3 results) Recent Labs    09/27/22 1204 09/28/22 0459 10/23/22 1408  BNP 28.0 53.0 93.0   Basic Metabolic Panel: Recent Labs  Lab 10/24/22 0525 10/25/22 0435 10/26/22 0428 10/27/22 0425 10/28/22 0415  NA 133* 132* 134* 135 136  K 3.8 3.7 3.7 3.8 3.9  CL 92* 93* 92* 95* 98  CO2 30 31 29 30 29   GLUCOSE 134* 113* 114* 119* 136*  BUN 29* 31* 34* 36* 35*  CREATININE 1.13 1.23 1.53* 1.58* 1.43*  CALCIUM 8.6* 8.3* 8.5* 8.7* 9.0  MG  --   --   --   --  2.6*   Liver Function Tests: Recent Labs  Lab 10/23/22 1409  AST 20  ALT 20  ALKPHOS 158*  BILITOT 2.9*  PROT 7.1  ALBUMIN 3.2*   No results for input(s): "LIPASE", "AMYLASE" in the last 168 hours. No results for input(s): "AMMONIA" in the last 168 hours. CBC: Recent Labs  Lab 10/23/22 1409 10/24/22 0525 10/25/22 0435 10/26/22 0428 10/27/22 0425 10/28/22 0415  WBC 21.1* 21.7* 17.6* 15.1* 11.9* 14.4*  NEUTROABS 18.5*  --   --   --   --   --   HGB 16.4 14.4 15.0 13.6 13.6 13.9  HCT 51.8 46.4 47.8 44.2 44.5 45.6  MCV 99.6 100.9* 100.6* 100.5* 101.4* 101.3*  PLT 209 217 234 234 230 239   Cardiac Enzymes: No results for input(s): "CKTOTAL", "CKMB", "CKMBINDEX", "TROPONINI" in the last 168 hours. BNP: Invalid input(s): "POCBNP" CBG: No results for input(s): "GLUCAP" in the last 168 hours. D-Dimer No results for input(s): "DDIMER" in the last 72 hours. Hgb A1c No results for input(s): "HGBA1C" in the last 72 hours. Lipid Profile No results for input(s): "CHOL", "HDL", "LDLCALC", "TRIG", "CHOLHDL", "LDLDIRECT" in the last 72 hours. Thyroid function studies No results for input(s): "TSH", "T4TOTAL", "T3FREE", "THYROIDAB" in the last 72 hours.  Invalid input(s): "FREET3" Anemia work up No results for input(s): "VITAMINB12", "FOLATE", "FERRITIN", "TIBC", "IRON", "RETICCTPCT" in the last 72 hours. Urinalysis    Component Value Date/Time   COLORURINE YELLOW 10/23/2022 1330   APPEARANCEUR  CLEAR 10/23/2022 1330   APPEARANCEUR Clear 10/14/2022 1019   LABSPEC 1.009 10/23/2022 1330   PHURINE 7.0 10/23/2022 1330   GLUCOSEU NEGATIVE 10/23/2022 1330   HGBUR NEGATIVE 10/23/2022 1330   BILIRUBINUR NEGATIVE 10/23/2022 1330   BILIRUBINUR Negative 10/14/2022 1019   KETONESUR NEGATIVE 10/23/2022 1330   PROTEINUR NEGATIVE 10/23/2022 1330   NITRITE NEGATIVE 10/23/2022 1330   LEUKOCYTESUR NEGATIVE 10/23/2022 1330   Sepsis Labs Recent Labs  Lab 10/25/22 0435 10/26/22 0428  10/27/22 0425 10/28/22 0415  WBC 17.6* 15.1* 11.9* 14.4*   Microbiology Recent Results (from the past 240 hour(s))  Blood culture (routine x 2)     Status: None   Collection Time: 10/23/22  2:09 PM   Specimen: Right Antecubital; Blood  Result Value Ref Range Status   Specimen Description   Final    RIGHT ANTECUBITAL BOTTLES DRAWN AEROBIC AND ANAEROBIC   Special Requests Blood Culture adequate volume  Final   Culture   Final    NO GROWTH 5 DAYS Performed at [facility], [address] [place], [place] [address]    Report Status 10/28/2022 FINAL  Final  Blood culture (routine x 2)     Status: None   Collection Time: 10/23/22  2:09 PM   Specimen: Left Antecubital; Blood  Result Value Ref Range Status   Specimen Description   Final    LEFT ANTECUBITAL BOTTLES DRAWN AEROBIC AND ANAEROBIC   Special Requests Blood Culture adequate volume  Final   Culture   Final    NO GROWTH 5 DAYS Performed at [facility], [address], [place], [place] [address]    Report Status 10/28/2022 FINAL  Final     Time coordinating discharge: 35 minutes  SIGNED:   Erick Blinks, DO [facility] 10/28/2022, 10:28 AM  If 7PM-7AM, please contact night-coverage [email or web address]

## 2022-10-28 NOTE — Plan of Care (Signed)
  Problem: Education: Goal: Knowledge of General Education information will improve Description: Including pain rating scale, medication(s)/side effects and non-pharmacologic comfort measures Outcome: Progressing   Problem: Nutrition: Goal: Adequate nutrition will be maintained Outcome: Progressing   Problem: Coping: Goal: Level of anxiety will decrease Outcome: Progressing   Problem: Elimination: Goal: Will not experience complications related to bowel motility Outcome: Progressing Goal: Will not experience complications related to urinary retention Outcome: Progressing   Problem: Pain Managment: Goal: General experience of comfort will improve Outcome: Progressing   Problem: Safety: Goal: Ability to remain free from injury will improve Outcome: Progressing   

## 2022-11-10 NOTE — Progress Notes (Deleted)
Cardiology Office Note    Date:  11/10/2022  ID:  Vernon Campbell, DOB 10-06-1940, MRN [id number] PCP:  System, Provider Not In  Cardiologist:  Charlton Haws, MD  Electrophysiologist:  None   Chief Complaint: ***  History of Present Illness: .    Vernon Campbell is a 82 y.o. 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   He was initially seen by Dr. Eden Emms in 2022 for aortic stenosis coming off of a hospitalization for acute on chronic hypoxic respiratory failure requiring home O2 at discharge. IM felt this was due to a/c HFpEF. Dr. Eden Emms referred to this as severe COPD and potential aspiration. He felt the edema was more likely related to obesity and chronic venous disease rather than heart failure. Over the past several years he has had multiple admissions for worsening edema, SOB, weight gain, and orthopnea frequently attributed to HFpEF. However, during some of these admissions it looked like there was component of overdiuresis so dry weight was a moving target. There was also previous confusion over medication dosing with both torsemide and furosemide on med lists. He was last discharged 10/28/22 following admission for cellulitis of multiple sites. Last echo 06/2022 EF 65-70%, G1DD, normal RV, mild-mod LAE, trivial MR, moderate-severe AS, mild dilation of aortic root. He has not had prior RHC. Of note PAF entered to PMH by RN in 2022 with unclear details; all prior EKGs in Epic show NSR.   Paf listed in hx list? Aortic root 06/2022  Chronic lymphedema/chronic HFpEF,  HTN Moderate-severe aortic stenosis CKD 3a Mild dilation of aortic root PAF, RBBB   Labwork independently reviewed: 10/2022 Hgb 13.9, plt 239, Mg not low, K 3.9, Cr 1.43, alb 3.2, AST ALT OK, AP 158 BNPs historically wnl  ROS: .    Please see the history of present illness. Otherwise, review of systems is positive for ***.  All other systems are reviewed and otherwise negative.  Studies Reviewed: Marland Kitchen    EKG:  EKG is ordered today, personally reviewed, demonstrating ***  CV Studies: Cardiac studies reviewed are outlined and summarized above. Otherwise please see EMR for full report.   Current Reported Medications:.    No outpatient medications have been marked as taking for the 11/12/22 encounter (Appointment) with Laurann Montana, PA-C.    Physical Exam:    VS:  There were no vitals taken for this visit.   Wt Readings from Last 3 Encounters:  10/23/22 281 lb 1.4 oz (127.5 kg)  10/04/22 268 lb 15.4 oz (122 kg)  07/07/22 281 lb 1.4 oz (127.5 kg)    GEN: Well nourished, well developed in no acute distress NECK: No JVD; No carotid bruits CARDIAC: ***RRR, no murmurs, rubs, gallops RESPIRATORY:  Clear to auscultation without rales, wheezing or rhonchi  ABDOMEN: Soft, non-tender, non-distended EXTREMITIES:  No edema; No acute deformity   Asessement and Plan:.     ***     Disposition: F/u with ***  Signed, Laurann Montana, PA-C

## 2022-11-12 ENCOUNTER — Ambulatory Visit: Payer: Medicare Other | Attending: Cardiovascular Disease | Admitting: Physician Assistant

## 2022-11-12 DIAGNOSIS — I10 Essential (primary) hypertension: Secondary | ICD-10-CM

## 2022-11-12 DIAGNOSIS — I890 Lymphedema, not elsewhere classified: Secondary | ICD-10-CM

## 2022-11-12 DIAGNOSIS — N1831 Chronic kidney disease, stage 3a: Secondary | ICD-10-CM

## 2022-11-12 DIAGNOSIS — I480 Paroxysmal atrial fibrillation: Secondary | ICD-10-CM

## 2022-11-12 DIAGNOSIS — I5032 Chronic diastolic (congestive) heart failure: Secondary | ICD-10-CM

## 2022-11-12 DIAGNOSIS — I4510 Unspecified right bundle-branch block: Secondary | ICD-10-CM

## 2022-11-12 DIAGNOSIS — I350 Nonrheumatic aortic (valve) stenosis: Secondary | ICD-10-CM

## 2022-12-03 ENCOUNTER — Encounter (HOSPITAL_COMMUNITY): Payer: Self-pay

## 2022-12-03 ENCOUNTER — Emergency Department (HOSPITAL_COMMUNITY): Payer: Medicare Other

## 2022-12-03 ENCOUNTER — Other Ambulatory Visit: Payer: Self-pay

## 2022-12-03 ENCOUNTER — Inpatient Hospital Stay (HOSPITAL_COMMUNITY)
Admission: EM | Admit: 2022-12-03 | Discharge: 2022-12-10 | DRG: 871 | Disposition: A | Discharge status: Skilled Nursing Facility | Payer: Medicare Other | Source: Skilled Nursing Facility | Attending: Obstetrics and Gynecology | Admitting: Obstetrics and Gynecology

## 2022-12-03 DIAGNOSIS — A4151 Sepsis due to Escherichia coli [E. coli]: Secondary | ICD-10-CM | POA: Diagnosis present

## 2022-12-03 DIAGNOSIS — I10 Essential (primary) hypertension: Secondary | ICD-10-CM | POA: Diagnosis not present

## 2022-12-03 DIAGNOSIS — Z66 Do not resuscitate: Secondary | ICD-10-CM | POA: Diagnosis present

## 2022-12-03 DIAGNOSIS — J449 Chronic obstructive pulmonary disease, unspecified: Secondary | ICD-10-CM | POA: Diagnosis present

## 2022-12-03 DIAGNOSIS — E782 Mixed hyperlipidemia: Secondary | ICD-10-CM

## 2022-12-03 DIAGNOSIS — E876 Hypokalemia: Secondary | ICD-10-CM | POA: Diagnosis not present

## 2022-12-03 DIAGNOSIS — G2581 Restless legs syndrome: Secondary | ICD-10-CM | POA: Diagnosis present

## 2022-12-03 DIAGNOSIS — L89153 Pressure ulcer of sacral region, stage 3: Secondary | ICD-10-CM | POA: Diagnosis present

## 2022-12-03 DIAGNOSIS — Z87891 Personal history of nicotine dependence: Secondary | ICD-10-CM

## 2022-12-03 DIAGNOSIS — I350 Nonrheumatic aortic (valve) stenosis: Secondary | ICD-10-CM | POA: Diagnosis present

## 2022-12-03 DIAGNOSIS — I2729 Other secondary pulmonary hypertension: Secondary | ICD-10-CM | POA: Diagnosis present

## 2022-12-03 DIAGNOSIS — I5032 Chronic diastolic (congestive) heart failure: Secondary | ICD-10-CM | POA: Diagnosis present

## 2022-12-03 DIAGNOSIS — I130 Hypertensive heart and chronic kidney disease with heart failure and stage 1 through stage 4 chronic kidney disease, or unspecified chronic kidney disease: Secondary | ICD-10-CM | POA: Diagnosis present

## 2022-12-03 DIAGNOSIS — E039 Hypothyroidism, unspecified: Secondary | ICD-10-CM | POA: Diagnosis present

## 2022-12-03 DIAGNOSIS — L03114 Cellulitis of left upper limb: Secondary | ICD-10-CM

## 2022-12-03 DIAGNOSIS — E871 Hypo-osmolality and hyponatremia: Secondary | ICD-10-CM | POA: Diagnosis present

## 2022-12-03 DIAGNOSIS — E861 Hypovolemia: Secondary | ICD-10-CM | POA: Diagnosis present

## 2022-12-03 DIAGNOSIS — E873 Alkalosis: Secondary | ICD-10-CM | POA: Diagnosis present

## 2022-12-03 DIAGNOSIS — K8000 Calculus of gallbladder with acute cholecystitis without obstruction: Secondary | ICD-10-CM | POA: Diagnosis present

## 2022-12-03 DIAGNOSIS — G4733 Obstructive sleep apnea (adult) (pediatric): Secondary | ICD-10-CM | POA: Diagnosis present

## 2022-12-03 DIAGNOSIS — Z6841 Body Mass Index (BMI) 40.0 and over, adult: Secondary | ICD-10-CM | POA: Diagnosis not present

## 2022-12-03 DIAGNOSIS — K810 Acute cholecystitis: Principal | ICD-10-CM | POA: Diagnosis present

## 2022-12-03 DIAGNOSIS — Z2239 Carrier of other specified bacterial diseases: Secondary | ICD-10-CM

## 2022-12-03 DIAGNOSIS — L0390 Cellulitis, unspecified: Secondary | ICD-10-CM | POA: Diagnosis present

## 2022-12-03 DIAGNOSIS — R109 Unspecified abdominal pain: Secondary | ICD-10-CM | POA: Diagnosis present

## 2022-12-03 DIAGNOSIS — I872 Venous insufficiency (chronic) (peripheral): Secondary | ICD-10-CM | POA: Diagnosis present

## 2022-12-03 DIAGNOSIS — Z1152 Encounter for screening for COVID-19: Secondary | ICD-10-CM

## 2022-12-03 DIAGNOSIS — N179 Acute kidney failure, unspecified: Secondary | ICD-10-CM | POA: Diagnosis present

## 2022-12-03 DIAGNOSIS — R6521 Severe sepsis with septic shock: Secondary | ICD-10-CM | POA: Diagnosis not present

## 2022-12-03 DIAGNOSIS — Z9981 Dependence on supplemental oxygen: Secondary | ICD-10-CM

## 2022-12-03 DIAGNOSIS — I2510 Atherosclerotic heart disease of native coronary artery without angina pectoris: Secondary | ICD-10-CM | POA: Diagnosis present

## 2022-12-03 DIAGNOSIS — K82A1 Gangrene of gallbladder in cholecystitis: Secondary | ICD-10-CM | POA: Diagnosis present

## 2022-12-03 DIAGNOSIS — R197 Diarrhea, unspecified: Secondary | ICD-10-CM | POA: Diagnosis present

## 2022-12-03 DIAGNOSIS — Z89421 Acquired absence of other right toe(s): Secondary | ICD-10-CM

## 2022-12-03 DIAGNOSIS — E785 Hyperlipidemia, unspecified: Secondary | ICD-10-CM | POA: Diagnosis present

## 2022-12-03 DIAGNOSIS — Z79899 Other long term (current) drug therapy: Secondary | ICD-10-CM

## 2022-12-03 DIAGNOSIS — J9611 Chronic respiratory failure with hypoxia: Secondary | ICD-10-CM | POA: Diagnosis present

## 2022-12-03 DIAGNOSIS — I2781 Cor pulmonale (chronic): Secondary | ICD-10-CM | POA: Diagnosis present

## 2022-12-03 DIAGNOSIS — I480 Paroxysmal atrial fibrillation: Secondary | ICD-10-CM | POA: Diagnosis present

## 2022-12-03 DIAGNOSIS — Z79890 Hormone replacement therapy (postmenopausal): Secondary | ICD-10-CM

## 2022-12-03 DIAGNOSIS — E559 Vitamin D deficiency, unspecified: Secondary | ICD-10-CM | POA: Diagnosis present

## 2022-12-03 DIAGNOSIS — Z515 Encounter for palliative care: Secondary | ICD-10-CM | POA: Diagnosis not present

## 2022-12-03 DIAGNOSIS — R339 Retention of urine, unspecified: Secondary | ICD-10-CM | POA: Diagnosis present

## 2022-12-03 DIAGNOSIS — N1831 Chronic kidney disease, stage 3a: Secondary | ICD-10-CM | POA: Diagnosis present

## 2022-12-03 DIAGNOSIS — Z7951 Long term (current) use of inhaled steroids: Secondary | ICD-10-CM

## 2022-12-03 DIAGNOSIS — B9620 Unspecified Escherichia coli [E. coli] as the cause of diseases classified elsewhere: Secondary | ICD-10-CM | POA: Diagnosis present

## 2022-12-03 LAB — CBC WITH DIFFERENTIAL/PLATELET
Abs Immature Granulocytes: 0.27 10*3/uL — ABNORMAL HIGH (ref 0.00–0.07)
Basophils Absolute: 0.1 10*3/uL (ref 0.0–0.1)
Basophils Relative: 0 %
Eosinophils Absolute: 0 10*3/uL (ref 0.0–0.5)
Eosinophils Relative: 0 %
HCT: 50.1 % (ref 39.0–52.0)
Hemoglobin: 15.5 g/dL (ref 13.0–17.0)
Immature Granulocytes: 1 %
Lymphocytes Relative: 6 %
Lymphs Abs: 1.1 10*3/uL (ref 0.7–4.0)
MCH: 30.8 pg (ref 26.0–34.0)
MCHC: 30.9 g/dL (ref 30.0–36.0)
MCV: 99.4 fL (ref 80.0–100.0)
Monocytes Absolute: 1.9 10*3/uL — ABNORMAL HIGH (ref 0.1–1.0)
Monocytes Relative: 10 %
Neutro Abs: 17 10*3/uL — ABNORMAL HIGH (ref 1.7–7.7)
Neutrophils Relative %: 83 %
Platelets: 218 10*3/uL (ref 150–400)
RBC: 5.04 MIL/uL (ref 4.22–5.81)
RDW: 17.9 % — ABNORMAL HIGH (ref 11.5–15.5)
WBC: 20.4 10*3/uL — ABNORMAL HIGH (ref 4.0–10.5)
nRBC: 0 % (ref 0.0–0.2)

## 2022-12-03 LAB — COMPREHENSIVE METABOLIC PANEL
ALT: 20 U/L (ref 0–44)
AST: 25 U/L (ref 15–41)
Albumin: 2.4 g/dL — ABNORMAL LOW (ref 3.5–5.0)
Alkaline Phosphatase: 151 U/L — ABNORMAL HIGH (ref 38–126)
Anion gap: 9 (ref 5–15)
BUN: 32 mg/dL — ABNORMAL HIGH (ref 8–23)
CO2: 33 mmol/L — ABNORMAL HIGH (ref 22–32)
Calcium: 8.4 mg/dL — ABNORMAL LOW (ref 8.9–10.3)
Chloride: 91 mmol/L — ABNORMAL LOW (ref 98–111)
Creatinine, Ser: 1.12 mg/dL (ref 0.61–1.24)
GFR, Estimated: >60 mL/min (ref >60)
Glucose, Bld: 153 mg/dL — ABNORMAL HIGH (ref 70–99)
Potassium: 3.2 mmol/L — ABNORMAL LOW (ref 3.5–5.1)
Sodium: 133 mmol/L — ABNORMAL LOW (ref 135–145)
Total Bilirubin: 3.2 mg/dL — ABNORMAL HIGH (ref 0.3–1.2)
Total Protein: 7 g/dL (ref 6.5–8.1)

## 2022-12-03 LAB — LIPASE, BLOOD: Lipase: 21 U/L (ref 11–51)

## 2022-12-03 LAB — TROPONIN I (HIGH SENSITIVITY): Troponin I (High Sensitivity): 14 ng/L (ref <18)

## 2022-12-03 LAB — PROTIME-INR
INR: 1.3 — ABNORMAL HIGH (ref 0.8–1.2)
Prothrombin Time: 16.3 s — ABNORMAL HIGH (ref 11.4–15.2)

## 2022-12-03 LAB — LACTIC ACID, PLASMA: Lactic Acid, Venous: 1.4 mmol/L (ref 0.5–1.9)

## 2022-12-03 LAB — SARS CORONAVIRUS 2 BY RT PCR: SARS Coronavirus 2 by RT PCR: NEGATIVE

## 2022-12-03 MED ORDER — ENOXAPARIN SODIUM 80 MG/0.8ML IJ SOSY: 0.5000 mg/kg | PREFILLED_SYRINGE | INTRAMUSCULAR | Status: DC

## 2022-12-03 MED ORDER — SODIUM CHLORIDE 0.9 % IV SOLN: 250.0000 mL | INTRAVENOUS | Status: DC | PRN

## 2022-12-03 MED ORDER — SODIUM CHLORIDE 0.9% FLUSH
3.0000 mL | INTRAVENOUS | Status: DC | PRN
  Administered 2022-12-04: 3 mL via INTRAVENOUS

## 2022-12-03 MED ORDER — ONDANSETRON HCL 4 MG/2ML IJ SOLN: 4.0000 mg | Freq: Four times a day (QID) | INTRAMUSCULAR | Status: DC | PRN

## 2022-12-03 MED ORDER — PANTOPRAZOLE SODIUM 40 MG PO TBEC
40.0000 mg | DELAYED_RELEASE_TABLET | Freq: Every day | ORAL | Status: DC
  Administered 2022-12-03 – 2022-12-10 (×8): 40 mg via ORAL
  Filled 2022-12-03 (×8): qty 1

## 2022-12-03 MED ORDER — LEVOTHYROXINE SODIUM 75 MCG PO TABS
175.0000 ug | ORAL_TABLET | Freq: Every day | ORAL | Status: DC
  Administered 2022-12-04 – 2022-12-10 (×7): 175 ug via ORAL
  Filled 2022-12-03 (×7): qty 1

## 2022-12-03 MED ORDER — ACETAMINOPHEN 325 MG PO TABS: 650.0000 mg | ORAL_TABLET | Freq: Four times a day (QID) | ORAL | Status: DC | PRN

## 2022-12-03 MED ORDER — ONDANSETRON HCL 4 MG/2ML IJ SOLN
4.0000 mg | Freq: Once | INTRAMUSCULAR | Status: AC
  Administered 2022-12-03: 4 mg via INTRAVENOUS
  Filled 2022-12-03: qty 2

## 2022-12-03 MED ORDER — PIPERACILLIN-TAZOBACTAM 3.375 G IVPB 30 MIN
3.3750 g | Freq: Once | INTRAVENOUS | Status: AC
  Administered 2022-12-03: 3.375 g via INTRAVENOUS
  Filled 2022-12-03: qty 50

## 2022-12-03 MED ORDER — ATORVASTATIN CALCIUM 10 MG PO TABS
20.0000 mg | ORAL_TABLET | Freq: Every evening | ORAL | Status: DC
  Administered 2022-12-03 – 2022-12-10 (×8): 20 mg via ORAL
  Filled 2022-12-03 (×8): qty 2

## 2022-12-03 MED ORDER — DEXTROSE-SODIUM CHLORIDE 5-0.9 % IV SOLN: INTRAVENOUS | Status: DC

## 2022-12-03 MED ORDER — SODIUM CHLORIDE 0.9 % IV BOLUS
500.0000 mL | Freq: Once | INTRAVENOUS | Status: AC
  Administered 2022-12-03: 500 mL via INTRAVENOUS

## 2022-12-03 MED ORDER — PIPERACILLIN-TAZOBACTAM 3.375 G IVPB
3.3750 g | Freq: Three times a day (TID) | INTRAVENOUS | Status: DC
  Administered 2022-12-03 – 2022-12-09 (×17): 3.375 g via INTRAVENOUS
  Filled 2022-12-03 (×17): qty 50

## 2022-12-03 MED ORDER — SODIUM CHLORIDE 0.9% FLUSH
3.0000 mL | Freq: Two times a day (BID) | INTRAVENOUS | Status: DC
  Administered 2022-12-04 – 2022-12-07 (×7): 3 mL via INTRAVENOUS
  Administered 2022-12-07: 10 mL via INTRAVENOUS
  Administered 2022-12-08 – 2022-12-10 (×5): 3 mL via INTRAVENOUS

## 2022-12-03 MED ORDER — FLUTICASONE FUROATE-VILANTEROL 100-25 MCG/ACT IN AEPB
1.0000 | INHALATION_SPRAY | Freq: Every day | RESPIRATORY_TRACT | Status: DC
  Administered 2022-12-04 – 2022-12-10 (×4): 1 via RESPIRATORY_TRACT
  Filled 2022-12-03 (×4): qty 28

## 2022-12-03 MED ORDER — ACETAMINOPHEN 650 MG RE SUPP: 650.0000 mg | Freq: Four times a day (QID) | RECTAL | Status: DC | PRN

## 2022-12-03 MED ORDER — LEVOTHYROXINE SODIUM 50 MCG PO TABS: 175.0000 ug | ORAL_TABLET | Freq: Every day | ORAL | Status: DC

## 2022-12-03 MED ORDER — MORPHINE SULFATE (PF) 4 MG/ML IV SOLN
4.0000 mg | Freq: Once | INTRAVENOUS | Status: AC
  Administered 2022-12-03: 4 mg via INTRAVENOUS
  Filled 2022-12-03: qty 1

## 2022-12-03 MED ORDER — IOHEXOL 300 MG/ML  SOLN
100.0000 mL | Freq: Once | INTRAMUSCULAR | Status: AC | PRN
  Administered 2022-12-03: 100 mL via INTRAVENOUS

## 2022-12-03 MED ORDER — MORPHINE SULFATE (PF) 2 MG/ML IV SOLN
2.0000 mg | INTRAVENOUS | Status: DC | PRN
  Administered 2022-12-03: 2 mg via INTRAVENOUS
  Filled 2022-12-03: qty 1

## 2022-12-03 MED ORDER — ONDANSETRON HCL 4 MG PO TABS
4.0000 mg | ORAL_TABLET | Freq: Four times a day (QID) | ORAL | Status: DC | PRN
  Filled 2022-12-03: qty 1

## 2022-12-03 MED ORDER — OXYCODONE HCL 5 MG PO TABS
5.0000 mg | ORAL_TABLET | ORAL | Status: DC | PRN
  Administered 2022-12-03 – 2022-12-09 (×4): 5 mg via ORAL
  Filled 2022-12-03 (×5): qty 1

## 2022-12-03 MED ORDER — TAMSULOSIN HCL 0.4 MG PO CAPS
0.4000 mg | ORAL_CAPSULE | Freq: Every evening | ORAL | Status: DC
  Administered 2022-12-03 – 2022-12-10 (×8): 0.4 mg via ORAL
  Filled 2022-12-03 (×8): qty 1

## 2022-12-03 MED ORDER — PRAMIPEXOLE DIHYDROCHLORIDE 0.25 MG PO TABS
1.0000 mg | ORAL_TABLET | Freq: Every day | ORAL | Status: DC
  Administered 2022-12-03 – 2022-12-09 (×7): 1 mg via ORAL
  Filled 2022-12-03: qty 4
  Filled 2022-12-03: qty 1
  Filled 2022-12-03: qty 4
  Filled 2022-12-03 (×5): qty 1
  Filled 2022-12-03: qty 4
  Filled 2022-12-03: qty 1

## 2022-12-03 MED ORDER — POTASSIUM CHLORIDE 10 MEQ/100ML IV SOLN
10.0000 meq | INTRAVENOUS | Status: AC
  Administered 2022-12-03 (×3): 10 meq via INTRAVENOUS
  Filled 2022-12-03 (×3): qty 100

## 2022-12-03 NOTE — Consult Note (Signed)
[facility] Consult  Reason for Consult: Acute gangrenous cholecystitis  Referring Physician: Dr. Criss Alvine  Chief Complaint   Abdominal Pain     HPI: Vernon Campbell is a 82 y.o. male with a history of CHF, Core pulmonale, Pulmonary HTN, COPD, Chronic hypoxia, A fib who live at [facility] and is a Hospice patient due to his cardiac and respiratory status per his report.   He has been having abdominal pain in the RUQ that started yesterday. It is constant and worsening. He has had associated nausea/vomiting. He is on 2L of [place] and has been at [facility] for 3 years. CT scan demonstrates gangrenous acute cholecystitis.   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 No pertinent surgical history.  History reviewed. No pertinent family history.  Social  History   Tobacco Use   Smoking status: Former    Types: Cigarettes   Smokeless tobacco: Never  Vaping Use   Vaping status: Never Used  Substance Use Topics   Alcohol use: Not Currently   Drug use: Not Currently    Medications: I have reviewed the patient's current medications. Prior to Admission: (Not in a hospital admission)  Scheduled:  atorvastatin  20 mg Oral QPM   enoxaparin (LOVENOX) injection  40 mg Subcutaneous Q24H   fluticasone furoate-vilanterol  1 puff Inhalation Daily   [START ON 12/04/2022] levothyroxine  175 mcg Oral QAC breakfast   pantoprazole  40 mg Oral Daily   pramipexole  1 mg Oral QHS   sodium chloride flush  3 mL Intravenous Q12H   tamsulosin  0.4 mg Oral QPM   Continuous:  sodium chloride     dextrose 5 % and 0.9 % NaCl     piperacillin-tazobactam (ZOSYN)  IV     potassium chloride     BMW:UXLKGM chloride, acetaminophen **OR** acetaminophen, morphine injection, ondansetron **OR** ondansetron (ZOFRAN) IV, oxyCODONE, sodium chloride flush  No Known Allergies   ROS:  A comprehensive review of systems was negative except for: Gastrointestinal: positive for abdominal pain, nausea, and reflux symptoms  Blood pressure 103/73, pulse (!) 102, temperature 98.3 F (36.8 C), temperature source Oral, resp. rate 19, height 5\' 9"  (1.753 m), weight 127.5  kg, SpO2 92%. Physical Exam Vitals reviewed.  Constitutional:      Appearance: He is obese.  HENT:     Head: Normocephalic.  Cardiovascular:     Rate and Rhythm: Tachycardia present.  Pulmonary:     Breath sounds: Normal breath sounds.  Abdominal:     General: There is distension.     Palpations: Abdomen is soft.     Tenderness: There is abdominal tenderness in the right upper quadrant.  Skin:    General: Skin is warm.  Neurological:     General: No focal deficit present.     Mental Status: He is alert and oriented to person, place, and time.  Psychiatric:        Mood and Affect: Mood normal.      Results: Results for orders placed or performed during the hospital encounter of 12/03/22 (from the past 48 hour(s))  Comprehensive metabolic panel     Status: Abnormal   Collection Time: 12/03/22 11:22 AM  Result Value Ref Range   Sodium 133 (L) 135 - 145 mmol/L   Potassium 3.2 (L) 3.5 - 5.1 mmol/L   Chloride 91 (L) 98 - 111 mmol/L   CO2 33 (H) 22 - 32 mmol/L   Glucose, Bld 153 (H) 70 - 99 mg/dL    Comment: Glucose reference range applies only to samples taken after fasting for at least 8 hours.   BUN 32 (H) 8 - 23 mg/dL   Creatinine, Ser 2.83 0.61 - 1.24 mg/dL   Calcium 8.4 (L) 8.9 - 10.3 mg/dL   Total Protein 7.0 6.5 - 8.1 g/dL   Albumin 2.4 (L) 3.5 - 5.0 g/dL   AST 25 15 - 41 U/L   ALT 20 0 - 44 U/L   Alkaline Phosphatase 151 (H) 38 - 126 U/L   Total Bilirubin 3.2 (H) 0.3 - 1.2 mg/dL   GFR, Estimated >15 >17 mL/min    Comment: (NOTE) Calculated using the CKD-EPI Creatinine Equation (2021)    Anion gap 9 5 - 15    Comment: Performed at [facility], [address] [place], [place] [address]  Lipase, blood     Status: None   Collection Time: 12/03/22 11:22 AM  Result Value Ref Range   Lipase 21 11 - 51 U/L    Comment: Performed at [facility], [address], [place], [place] [address]  CBC with Differential     Status: Abnormal   Collection Time: 12/03/22 11:22 AM  Result Value Ref Range   WBC 20.4 (H) 4.0 - 10.5 K/uL   RBC 5.04 4.22 - 5.81 MIL/uL   Hemoglobin 15.5 13.0 - 17.0 g/dL   HCT 26.9 48.5 - 46.2 %   MCV 99.4 80.0 - 100.0 fL   MCH 30.8 26.0 - 34.0 pg   MCHC 30.9 30.0 - 36.0 g/dL   RDW 70.3 (H) 50.0 - 93.8 %   Platelets 218 150 - 400 K/uL   nRBC 0.0 0.0 - 0.2 %   Neutrophils Relative % 83 %   Neutro Abs 17.0 (H) 1.7 - 7.7 K/uL   Lymphocytes Relative 6 %   Lymphs Abs 1.1 0.7 - 4.0 K/uL   Monocytes Relative 10 %   Monocytes Absolute 1.9 (H) 0.1 - 1.0 K/uL   Eosinophils Relative 0 %   Eosinophils Absolute 0.0 0.0 - 0.5 K/uL   Basophils Relative 0 %    Basophils Absolute 0.1 0.0 - 0.1 K/uL   Immature Granulocytes 1 %   Abs  Immature Granulocytes 0.27 (H) 0.00 - 0.07 K/uL    Comment: Performed at [facility], [address] [place], [place] [address]  Troponin I (High Sensitivity)     Status: None   Collection Time: 12/03/22 11:22 AM  Result Value Ref Range   Troponin I (High Sensitivity) 14 <18 ng/L    Comment: (NOTE) Elevated high sensitivity troponin I (hsTnI) values and significant  changes across serial measurements may suggest ACS but many other  chronic and acute conditions are known to elevate hsTnI results.  Refer to the "Links" section for chest pain algorithms and additional  guidance. Performed at [facility], [address] [place], [place] [address]   Lactic acid, plasma     Status: None   Collection Time: 12/03/22  1:55 PM  Result Value Ref Range   Lactic Acid, Venous 1.4 0.5 - 1.9 mmol/L    Comment: Performed at [facility], [address] [place], [place] [address]  SARS Coronavirus 2 by RT PCR (hospital order, performed in [facility] hospital lab) *cepheid single result test* Anterior Nasal Swab     Status: None   Collection Time: 12/03/22  3:37 PM   Specimen: Anterior Nasal Swab  Result Value Ref Range   SARS Coronavirus 2 by RT PCR NEGATIVE NEGATIVE    Comment: (NOTE) SARS-CoV-2 target nucleic acids are NOT DETECTED.  The SARS-CoV-2 RNA is generally detectable in upper and lower respiratory specimens during the acute phase of infection. The lowest concentration of SARS-CoV-2 viral copies this assay can detect is 250 copies / mL. A negative result does not preclude SARS-CoV-2 infection and should not be used as the sole basis for treatment or other patient management decisions.  A negative result may occur with improper specimen collection / handling, submission of specimen other than nasopharyngeal swab, presence of viral mutation(s) within the areas targeted by this assay, and inadequate number of  viral copies (<250 copies / mL). A negative result must be combined with clinical observations, patient history, and epidemiological information.  Fact Sheet for Patients:   RoadLapTop.co.za  Fact Sheet for Healthcare Providers: http://kim-miller.com/  This test is not yet approved or  cleared by the [place] FDA and has been authorized for detection and/or diagnosis of SARS-CoV-2 by FDA under an Emergency Use Authorization (EUA).  This EUA will remain in effect (meaning this test can be used) for the duration of the COVID-19 declaration under Section 564(b)(1) of the Act, 21 U.S.C. section 360bbb-3(b)(1), unless the authorization is terminated or revoked sooner.  Performed at [facility], [address], [place], [place] [address]    Personally reviewed- extensive edema and wall thickening of the gallbladder, stones, possible air noted along wall CT ABDOMEN PELVIS W CONTRAST  Result Date: 12/03/2022 CLINICAL DATA:  Vomiting.  Nausea EXAM: CT ABDOMEN AND PELVIS WITH CONTRAST TECHNIQUE: Multidetector CT imaging of the abdomen and pelvis was performed using the standard protocol following bolus administration of intravenous contrast. RADIATION DOSE REDUCTION: This exam was performed according to the departmental dose-optimization program which includes automated exposure control, adjustment of the mA and/or kV according to patient size and/or use of iterative reconstruction technique. CONTRAST:  OMNIPAQUE IOHEXOL 300 MG/ML  SOLN COMPARISON:  None FINDINGS: Lower chest: Heart is enlarged. There are significant coronary artery calcifications. There also calcifications along the aortic valve. Basilar atelectasis. Tiny effusions. Left-greater-than-right. Hepatobiliary: Markedly enlarged gallbladder with wall thickening, edema. There also areas of poor enhancement along the gallbladder wall. There is also air  towards the fundus in the wall  itself. Stones are seen dependently as well as a stone in what appears to be the cystic duct on coronal series 5, image 65. Findings are worrisome for developing emphysematous cholecystitis. There are some adjacent reactive nodes identified. Significant inflammatory stranding and fluid. Periportal edema. No space-occupying separate liver lesion.  Patent portal vein. Pancreas: Unremarkable. No pancreatic ductal dilatation or surrounding inflammatory changes. Spleen: Normal in size without focal abnormality. Adrenals/Urinary Tract: Adrenal glands are preserved. No collecting system dilatation. There is a lower pole slightly exophytic lesion on the right measuring 2.8 cm. Hounsfield unit on portal venous phase of 47. On delayed 60. Indeterminate lesion. Please correlate with any prior or dedicated workup when appropriate. A postcontrast study after clearance of the current contrast may be useful to assess for density without contrast. Also a tiny low-attenuation lesion along the anterior aspect of the left mid kidney as well which is too small to completely characterize. Attention on follow up examination as well. The ureters have normal course and caliber extending down to the bladder. Foley catheter in the underdistended urinary bladder. Stomach/Bowel: No oral contrast. The stomach is relatively collapsed. Third portion duodenal diverticula. Small bowel is nondilated. Terminal ileal small bowel stool appearance, nonspecific. The large bowel has moderate stool diffusely is nondilated. There is some redundant course of the sigmoid colon with mild wall thickening along the rectosigmoid colon, nonspecific. A subtle colitis is not excluded. Vascular/Lymphatic: Normal caliber aorta and IVC with scattered vascular calcifications along the aorta and branch vessels. Few prominent upper abdominal nodes. No clear pathologically enlarged lymph nodes identified. Reproductive: Prominent prostate. Other: Mesenteric stranding  identified. Once again there is fluid in the right upper quadrant surrounding the liver and extending along the right pericolic gutter. Musculoskeletal: Curvature and degenerative changes along the spine. Degenerative changes of the pelvis. Sclerotic focus along the right pubic bone on series 2, image 89. Possibly a bone island but there is a differential. Critical Value/emergent results were called by telephone at the time of interpretation on 12/03/2022 at 11:58 am to provider Pricilla Loveless , who verbally acknowledged these results. IMPRESSION: Dilated gallbladder with severe inflammatory changes, wall thickening and fluid. There are areas of poor enhancement along the wall with the air in the wall towards the fundus. Numerous stones including what may be a stone in the cystic duct. Findings are consistent with cholecystitis and with air and poor enhancement of the wall, emphysematous/gangrenous gallbladder. Scattered colonic stool. There is some wall thickening along underdistended sigmoid colon. This could be passive although a subtle colitis is not excluded. Please correlate with any symptoms. Indeterminate right-sided renal lesion. Recommend comparison to prior or follow up study after clearance of the contrast to assess for precontrast density. Lung base opacities with trace pleural fluid, left-greater-than-right. Recommend follow-up. Significant calcifications along the area of the aortic valve. Electronically Signed   By: Karen Kays M.D.   On: 12/03/2022 15:00   DG Chest Portable 1 View  Result Date: 12/03/2022 CLINICAL DATA:  Vomiting EXAM: PORTABLE CHEST 1 VIEW COMPARISON:  X-ray 10/23/2022 FINDINGS: Underinflation. Small left effusion with some adjacent opacity. No pneumothorax. Enlarged cardiopericardial silhouette with vascular congestion. Dense left upper lobe lung nodule consistent with old granulomatous disease. Film is under penetrated. IMPRESSION: Underinflation.  Small left effusion with  some adjacent opacity. Enlarged heart with some vascular congestion. Electronically Signed   By: Karen Kays M.D.   On: 12/03/2022 13:11     Assessment & Plan:  Vernon Campbell is a 82 y.o. male with acute gangrenous cholecystitis on CT. Given this and his co-morbidities he will need an IR guided cholecystostomy tube.    Discussed with Dr. Criss Alvine and the patient.  Admitted to Hospitalist with plans for transfer down.  Discussed with patient that this will likely be a permanent tube.  IV antibiotics   All questions were answered to the satisfaction of the patient.   Lucretia Roers 12/03/2022, 5:32 PM

## 2022-12-03 NOTE — Progress Notes (Signed)
IR Procedure Request - cholecystostomy tube placement  82 y.o. Male. Palliative. Presented to the ED from a facility reporting nausea, vomiting  and RUQ pain X 2 days. History of CHF, COPD (on 3L of 02), core pulmonale HTN,  HLD, a fib ( not on anticoagulation).  Recently admitted X 2 for acute on chronic CHF and lower extremity cellulites. CT Abd pelvis from 9.13.24  reads Dilated gallbladder with severe inflammatory changes, wall thickening and fluid. There are areas of poor enhancement along the wall with the air in the wall towards the fundus. Numerous stones including what may be a stone in the cystic duct. Findings are consistent with cholecystitis and with air and poor enhancement of the wall, emphysematous/gangrenous gallbladder. Patient was deemed not a surgical candidate. Team is requesting a cholecystomy tube placement.   WBC is 20.4, Potassium 3.2, BUN 32, Alkaline phosphatase 251, total bilirubin 3.2. Last recorded vitals B/P 103/73, HR 102 rr 19. Patient is afebrile  Case discussed with IR Attending Dr. Mauri Reading Mir. Recommends condition be medically managed and transfer to [facility] for further evaluation and possible percutaneous intervention. This was communicated directly to the Team who is in agreement with the plan of care.

## 2022-12-03 NOTE — Assessment & Plan Note (Signed)
Hypovolemic hyponatremia, contraction metabolic alkalosis.   Plan to add 40 meq Kcl IV, and start volume resuscitation with isotonic saline at 75 ml per Follow up renal function and electrolytes in am.  Continue to hold on diuretic therapy for now.

## 2022-12-03 NOTE — Assessment & Plan Note (Signed)
Continue close blood pressure monitoring.  For now hold on antihypertensive medications and diuretics.

## 2022-12-03 NOTE — Assessment & Plan Note (Signed)
Patient has left wrist cyst cyst, soft nodule.  Possible local infection. Continue close follow up while on antibiotic therapy. Does not appear to involve the wrist joint.

## 2022-12-03 NOTE — Assessment & Plan Note (Signed)
Calculated BMI is 41.5

## 2022-12-03 NOTE — Assessment & Plan Note (Signed)
Continue with levothyroxine

## 2022-12-03 NOTE — Assessment & Plan Note (Signed)
Continue statin therapy.

## 2022-12-03 NOTE — Progress Notes (Signed)
Pharmacy Antibiotic Note  Vernon Campbell is a 82 y.o. male admitted on 12/03/2022 with  intra-abdominal infection .  Pharmacy has been consulted for zosyn dosing.  Plan: Zosyn 3.375g IV q8h (4 hour infusion). F/u cxs and clinical progress Monitor V/S, labs  Height: 5\' 9"  (175.3 cm) Weight: 127.5 kg (281 lb) IBW/kg (Calculated) : 70.7  Temp (24hrs), Avg:98.3 F (36.8 C), Min:98.3 F (36.8 C), Max:98.3 F (36.8 C)  Recent Labs  Lab 12/03/22 1122 12/03/22 1355  WBC 20.4*  --   CREATININE 1.12  --   LATICACIDVEN  --  1.4    Estimated Creatinine Clearance: 64.9 mL/min (by C-G formula based on SCr of 1.12 mg/dL).    No Known Allergies  Antimicrobials this admission: zosyn 9/13 >>   Microbiology results: No cxs  Thank you for allowing pharmacy to be a part of this patient's care.  Elder Cyphers, BS Pharm D, BCPS Clinical Pharmacist 12/03/2022 5:02 PM

## 2022-12-03 NOTE — H&P (Addendum)
History and Physical    Patient: Vernon Campbell NWG:[id number] DOB: 27-Sep-1940 DOA: 12/03/2022 DOS: the patient was seen and examined on 12/03/2022 PCP: System, Provider Not In  Patient coming from: SNF  Chief Complaint:  Chief Complaint  Patient presents with   Abdominal Pain   HPI: Vernon Campbell is a 82 y.o. male with medical history significant of heart failure, core pulmonale, pulmonary hypertension, COPD, chronic hypoxia, dyslipidemia,  paroxysmal atrial fibrillation, and obesity class 3 who presented with abdominal pain.  Patient reports abdominal pain, right upper quadrant that started about 1 pm yesterday after eating. Since then it has been constant, moderate to severe in intensity, colicky in nature, associated with nausea and vomiting, no radiation, no improving or worsening factors.   He has been a resident from a local skilled nursing facility for the last 3 years. He uses supplemental 02 per [place] for chronic hypoxia. Endorsed having very poor mobility and over the last year he has been mainly using a scooter for mobility and walker for small distances.   Last hospitalization was in 08/3 to 08/82024 for left hand, left leg and scrotal cellulitis. He was discharged back to the nursing home to continue oral antibiotics for 9 more days to complete 14 day course, along with palliative care services.    Review of Systems: As mentioned in the history of present illness. All other systems reviewed and are negative. 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 No pertinent surgical history. Social History:  reports that he has quit smoking. His smoking use included cigarettes. He has never used smokeless tobacco. He reports that he does not currently use alcohol. He reports that he does not currently use drugs.  No Known Allergies  History reviewed. No pertinent family history.  Prior to Admission medications   Medication Sig Start Date End Date Taking? Authorizing Provider  albuterol (PROVENTIL) (2.5 MG/3ML) 0.083% nebulizer solution Take 3 mLs (2.5 mg total) by nebulization every 2 (two) hours as needed for shortness of breath. 12/15/21   Shon Hale, MD  atorvastatin (LIPITOR) 20 MG tablet Take 20 mg by mouth every evening.    [provider]  bacitracin 500 UNIT/GM ointment Apply 1 Application  topically 2 (two) times daily.    [provider]  fluticasone furoate-vilanterol (BREO ELLIPTA) 100-25 MCG/ACT AEPB Inhale 1 puff into the lungs daily. 12/16/21   Shon Hale, MD  HYDROcodone-acetaminophen (NORCO/VICODIN) 5-325 MG tablet Take 1 tablet by mouth every 8 (eight) hours as needed for moderate pain. 10/28/22   Sherryll Burger, Pratik D, DO  ketoconazole (NIZORAL) 2 % shampoo Apply 1 application  topically 2 (two) times a week. Apply to scalp (shampoo)  topically during day shift every Tuesday and Friday for tinea versicolor (bath days)    [provider]  lactulose (CHRONULAC) 10 GM/15ML solution Take 15 mLs by mouth daily.    [provider]  levothyroxine (SYNTHROID) 175 MCG tablet Take 175 mcg by mouth daily before breakfast.    [provider]  OXYGEN Inhale 2 L/min into the lungs continuous.    [provider]  polyethylene glycol (MIRALAX / GLYCOLAX) 17 g packet Take 17 g by mouth daily.    [provider]  potassium chloride SA (KLOR-CON) 20 MEQ tablet Take 20 mEq by mouth daily.    [provider]  pramipexole (MIRAPEX) 1 MG tablet Take 1 tablet (1 mg total) by mouth at bedtime. 10/28/22   Sherryll Burger, Pratik D, DO  senna (SENOKOT) 8.6 MG TABS tablet Take 17.2 mg by mouth 2 (two) times daily.    [provider]  spironolactone (ALDACTONE) 50 MG tablet Take 1 tablet (50 mg total) by mouth daily. 10/06/22 11/05/22  Sherryll Burger, Pratik D, DO  tamsulosin (FLOMAX) 0.4 MG CAPS capsule Take 1 capsule (0.4 mg total) by mouth daily after supper. Patient taking differently: Take 0.4 mg by mouth every evening. 09/06/22   Donnita Falls, FNP  Torsemide 40 MG TABS Take 40 mg by mouth See admin instructions. Take 60 mg every morning and 40 mg every evening 10/06/22   Maurilio Lovely D, DO    Physical Exam: Vitals:   12/03/22 1046 12/03/22 1051 12/03/22 1430 12/03/22 1541  BP: 112/83  103/73   Pulse: (!) 102  (!) 102   Resp: 18  19   Temp: 98.3 F (36.8 C)   98.3 F (36.8 C)  TempSrc: Oral   Oral  SpO2: 94%  92%   Weight:  127.5 kg    Height:  5\' 9"  (1.753 m)     Neurology awake and alert ENT with mild pallor with no icterus Cardiovascular with S1 S2 present and rhythmic, no gallops, or rubs. Positive systolic murmur at the base.  No JVD Respiratory with no rales or wheezing, no rhonchi Abdomen with mild distention, tender to superficial palpation at the right upper quadrant, with no rebound or  guarding Trace non pitting lower extremity edema Positive bilateral distal leg discoloration but not erythema.  Left wrist with soft nodule, tender to palpation with local erythema but no increased temperature.    Data Reviewed:   Na 133, K 3,2 CL 91, bicarbonate 33, glucose 153, bun 32 cr 1,12 AST 25, ALT 20, Lipase 2, total bilirubin 3,2 ALK P 151  Wbc 20,4 hgb 15.5 plt 218   CT abdomen and pelvis with dilated gallbladder with severe inflammatory changes, wall thickening and fluid. There are areas of poor enhancement along the wall with the air in the wall towards the fundus. Numerous stones including what may be a stone in the cystic duct. Findings are consistent with cholelithiasis and with air and poor enhancement of the wall, emphysematous/ gangrenous gallbladder.  Indeterminate right sided renal  lesion.   Chest radiograph with cardiomegaly with left hemidiaphragm elevation with left base atelectasis. No infiltrates. Small left pleural effusion.   EKG 105 bpm, right axis, qtc 504, right bundle branch block, sinus rhythm with no significant ST segment or T wave changes.   Assessment and Plan: * Acute gangrenous cholecystitis Complicated with acute sepsis, present on admission.   Plan to continue broad spectrum antibiotic therapy with Zosyn Hold on diuretics and add IV fluids resuscitation with isotonic saline at 75 ml per hr. Keep NPO for now, as needed analgesics and antiemetics.  GI prophylaxis with pantoprazole.  Follow up cultures, temperature curve and cell count.  Source control with cholecystectomy tube. Patient currently not candidate for surgery per Dr Henreitta Leber.  Will transfer patient to [facility] for IR procedure.   Chronic respiratory failure with hypoxia (HCC) COPD with no exacerbation.  Plan to continue supplemental 02 per [place] to keep 02 saturation 88% or greater.  Continue with bronchodilator therapy.   Chronic diastolic CHF (congestive heart failure)  (HCC) Echocardiogram from 06/2022 with preserved LV systolic function with EF 65 to 70%, mild LVH, RV systolic function preserved. LA with mild to moderate dilatation. 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 Trivial MR and TR.   Patient clinically hypovolemic.  Plan to hold on diuretic therapy for now (he is on high doses of torsemide as outpatient). Cautious IV fluids with isotonic saline at 75 ml per hr.  Continue close blood pressure monitoring.  He has a very poor functional physical capacity, not good surgical candidate at this point.   Patient has the diagnosis of paroxysmal atrial fibrillation, currently he is on sinus rhythm and he has been of AV blocking agents or anticoagulation.   Hypokalemia Hypovolemic hyponatremia, contraction metabolic alkalosis.   Plan to add 40 meq Kcl IV, and start volume resuscitation with isotonic saline at 75 ml per Follow up renal function and electrolytes in am.  Continue to hold on diuretic therapy for now.   Essential hypertension Continue close blood pressure monitoring.  For now hold on antihypertensive medications and diuretics.   Hypothyroidism Continue with levothyroxine.   Hyperlipidemia Continue statin therapy.  Obesity, Class III, BMI 40-49.9 (morbid obesity) (HCC) Calculated BMI is 41.5   Cellulitis Patient has left wrist cyst cyst, soft nodule.  Possible local infection. Continue close follow up while on antibiotic therapy. Does not appear to involve the wrist joint.       Advance Care Planning:   Code Status: Limited: Do not attempt resuscitation (DNR) -DNR-LIMITED -Do Not Intubate/DNI  advance directive on the chart   Consults: Surgery over the phone while in the ED at [facility].   Family Communication: no family at the bedside. I spoke with patient's son over the phone, we talked in detail about patient's condition, plan of care and prognosis and all questions were addressed.   Severity of Illness: The appropriate  patient status for this patient is INPATIENT. Inpatient status is judged to be reasonable and necessary in order to provide the required intensity of service to ensure the patient's safety. The patient's presenting symptoms, physical exam findings, and initial radiographic and laboratory data in the context of their chronic comorbidities is felt to place them at high risk for further clinical deterioration. Furthermore, it is not anticipated that the patient will be medically stable for discharge from the hospital within 2 midnights of admission.   * I certify that at the point of admission it is my clinical judgment that the patient  will require inpatient hospital care spanning beyond 2 midnights from the point of admission due to high intensity of service, high risk for further deterioration and high frequency of surveillance required.*  Author: Coralie Keens, MD 12/03/2022 4:15 PM  For on call review [email or web address].

## 2022-12-03 NOTE — Assessment & Plan Note (Addendum)
Complicated with acute sepsis, present on admission.   Plan to continue broad spectrum antibiotic therapy with Zosyn Hold on diuretics and add IV fluids resuscitation with isotonic saline at 75 ml per hr. Keep NPO for now, as needed analgesics and antiemetics.  GI prophylaxis with pantoprazole.  Follow up cultures, temperature curve and cell count.  Source control with cholecystectomy tube. Patient currently not candidate for surgery per Dr Henreitta Leber.  Will transfer patient to [facility] for IR procedure.

## 2022-12-03 NOTE — ED Provider Notes (Signed)
[place] EMERGENCY DEPARTMENT AT [facility] Provider Note   CSN: [id number] Arrival date & time: 12/03/22  1028     History  Chief Complaint  Patient presents with   Abdominal Pain    Vernon Campbell is a 82 y.o. male.  HPI 82 year old male presents with abdominal pain and vomiting.  Symptoms started yesterday.  He has had multiple episodes of emesis and has been hurting ever since about 1 PM.  He denies any fevers, chest pain, back pain, shortness of breath.  He has noticed some diarrhea.  No hematemesis or urinary symptoms.  The pain is currently about a 7 out of 10.  It is primarily in his right upper quadrant.  No prior gallbladder surgery.  Home Medications Prior to Admission medications   Medication Sig Start Date End Date Taking? Authorizing Provider  albuterol (PROVENTIL) (2.5 MG/3ML) 0.083% nebulizer solution Take 3 mLs (2.5 mg total) by nebulization every 2 (two) hours as needed for shortness of breath. 12/15/21   Shon Hale, MD  atorvastatin (LIPITOR) 20 MG tablet Take 20 mg by mouth every evening.    [provider]  bacitracin 500 UNIT/GM ointment Apply 1 Application topically 2 (two) times daily.    [provider]  fluticasone furoate-vilanterol (BREO ELLIPTA) 100-25 MCG/ACT AEPB Inhale 1 puff into the lungs daily. 12/16/21   Shon Hale, MD  HYDROcodone-acetaminophen (NORCO/VICODIN) 5-325 MG tablet Take 1 tablet by mouth every 8 (eight) hours as needed for moderate pain. 10/28/22   Sherryll Burger, Pratik D, DO  ketoconazole (NIZORAL) 2 % shampoo Apply 1 application  topically 2 (two) times a week. Apply to scalp (shampoo) topically during day shift every Tuesday and Friday for tinea versicolor (bath days)    [provider]  lactulose (CHRONULAC) 10 GM/15ML solution Take 15 mLs by mouth daily.    [provider]  levothyroxine (SYNTHROID) 175 MCG tablet Take 175 mcg by mouth daily before breakfast.    [provider]   OXYGEN Inhale 2 L/min into the lungs continuous.    [provider]  polyethylene glycol (MIRALAX / GLYCOLAX) 17 g packet Take 17 g by mouth daily.    [provider]  potassium chloride SA (KLOR-CON) 20 MEQ tablet Take 20 mEq by mouth daily.    [provider]  pramipexole (MIRAPEX) 1 MG tablet Take 1 tablet (1 mg total) by mouth at bedtime. 10/28/22   Sherryll Burger, Pratik D, DO  senna (SENOKOT) 8.6 MG TABS tablet Take 17.2 mg by mouth 2 (two) times daily.    [provider]  spironolactone (ALDACTONE) 50 MG tablet Take 1 tablet (50 mg total) by mouth daily. 10/06/22 11/05/22  Sherryll Burger, Pratik D, DO  tamsulosin (FLOMAX) 0.4 MG CAPS capsule Take 1 capsule (0.4 mg total) by mouth daily after supper. Patient taking differently: Take 0.4 mg by mouth every evening. 09/06/22   Donnita Falls, FNP  Torsemide 40 MG TABS Take 40 mg by mouth See admin instructions. Take 60 mg every morning and 40 mg every evening 10/06/22   Maurilio Lovely D, DO      Allergies    Patient has no known allergies.    Review of Systems   Review of Systems  Respiratory:  Negative for shortness of breath.   Cardiovascular:  Negative for chest pain.  Gastrointestinal:  Positive for abdominal pain, diarrhea, nausea and vomiting.  Genitourinary:  Negative for dysuria.  Musculoskeletal:  Negative for back pain.    Physical Exam Updated  Vital Signs BP 112/83 (BP Location: Left Arm)   Pulse (!) 102   Temp 98.3 F (36.8 C) (Oral)   Resp 18   Ht 5\' 9"  (1.753 m)   Wt 127.5 kg   SpO2 94%   BMI 41.50 kg/m  Physical Exam Vitals and nursing note reviewed.  Constitutional:      General: He is not in acute distress.    Appearance: He is well-developed. He is not ill-appearing or diaphoretic.  HENT:     Head: Normocephalic and atraumatic.  Cardiovascular:     Rate and Rhythm: Normal rate and regular rhythm.     Heart sounds: Normal heart sounds.  Pulmonary:     Effort: Pulmonary effort is normal.      Breath sounds: Normal breath sounds.  Abdominal:     Palpations: Abdomen is soft.     Tenderness: There is generalized abdominal tenderness (worst in RUQ).  Skin:    General: Skin is warm and dry.  Neurological:     Mental Status: He is alert.     ED Results / Procedures / Treatments   Labs (all labs ordered are listed, but only abnormal results are displayed) Labs Reviewed  COMPREHENSIVE METABOLIC PANEL - Abnormal; Notable for the following components:      Result Value   Sodium 133 (*)    Potassium 3.2 (*)    Chloride 91 (*)    CO2 33 (*)    Glucose, Bld 153 (*)    BUN 32 (*)    Calcium 8.4 (*)    Albumin 2.4 (*)    Alkaline Phosphatase 151 (*)    Total Bilirubin 3.2 (*)    All other components within normal limits  CBC WITH DIFFERENTIAL/PLATELET - Abnormal; Notable for the following components:   WBC 20.4 (*)    RDW 17.9 (*)    Neutro Abs 17.0 (*)    Monocytes Absolute 1.9 (*)    Abs Immature Granulocytes 0.27 (*)    All other components within normal limits  SARS CORONAVIRUS 2 BY RT PCR  LIPASE, BLOOD  LACTIC ACID, PLASMA  URINALYSIS, W/ REFLEX TO CULTURE (INFECTION SUSPECTED)  TROPONIN I (HIGH SENSITIVITY)    EKG EKG Interpretation Date/Time:  Friday December 03 2022 13:42:15 EDT Ventricular Rate:  105 PR Interval:  149 QRS Duration:  155 QT Interval:  381 QTC Calculation: 504 R Axis:   202  Text Interpretation: Sinus tachycardia Atrial premature complex RBBB and LPFB Anterolateral infarct, age indeterminate Prolonged QT interval rate is faster compared to July 2024 Confirmed by Pricilla Loveless [phone]) on 12/03/2022 2:06:54 PM  Radiology DG Chest Portable 1 View  Result Date: 12/03/2022 CLINICAL DATA:  Vomiting EXAM: PORTABLE CHEST 1 VIEW COMPARISON:  X-ray 10/23/2022 FINDINGS: Underinflation. Small left effusion with some adjacent opacity. No pneumothorax. Enlarged cardiopericardial silhouette with vascular congestion. Dense left upper lobe lung nodule  consistent with old granulomatous disease. Film is under penetrated. IMPRESSION: Underinflation.  Small left effusion with some adjacent opacity. Enlarged heart with some vascular congestion. Electronically Signed   By: Karen Kays M.D.   On: 12/03/2022 13:11    Procedures .Critical Care  Performed by: Pricilla Loveless, MD Authorized by: Pricilla Loveless, MD   Critical care provider statement:    Critical care time (minutes):  45   Critical care time was exclusive of:  Separately billable procedures and treating other patients   Critical care was necessary to treat or prevent imminent or life-threatening deterioration of the following conditions:  Sepsis   Critical care was time spent personally by me on the following activities:  Development of treatment plan with patient or surrogate, discussions with consultants, evaluation of patient's response to treatment, examination of patient, ordering and review of laboratory studies, ordering and review of radiographic studies, ordering and performing treatments and interventions, pulse oximetry, re-evaluation of patient's condition and review of old charts     Medications Ordered in ED Medications  morphine (PF) 4 MG/ML injection 4 mg (4 mg Intravenous Given 12/03/22 1131)  ondansetron (ZOFRAN) injection 4 mg (4 mg Intravenous Given 12/03/22 1131)  sodium chloride 0.9 % bolus 500 mL (500 mLs Intravenous Bolus 12/03/22 1131)  iohexol (OMNIPAQUE) 300 MG/ML solution 100 mL (100 mLs Intravenous Contrast Given 12/03/22 1222)  piperacillin-tazobactam (ZOSYN) IVPB 3.375 g (0 g Intravenous Stopped 12/03/22 1415)    ED Course/ Medical Decision Making/ A&P                                 Medical Decision Making Amount and/or Complexity of Data Reviewed Labs: ordered.    Details: WBC 20 Radiology: ordered and independent interpretation performed.    Details: Cholecystitis. ECG/medicine tests: ordered and independent interpretation performed.    Details:  Afib Discussion of management or test interpretation with external provider(s): Radiology, General Surgery  Risk Prescription drug management. Decision regarding hospitalization.   Patient was given IV morphine for pain.  He appears to have acute cholecystitis.  Discussed with radiology, appears to be emphysematous/gangrenous cholecystitis.  He is doing better with the pain medicine.  He does have a hospice nurse and he does not think he would want a full surgery but wants to check with his son.  I discussed with general surgery, Dr. Henreitta Leber, this would be better served with a tube drainage rather than OR.  Discussed with hospitalist, Dr. Ella Jubilee, for admission.  He will need admission to [facility] for this.        Final Clinical Impression(s) / ED Diagnoses Final diagnoses:  Acute emphysematous cholecystitis    Rx / DC Orders ED Discharge Orders     None         Pricilla Loveless, MD 12/03/22 1516

## 2022-12-03 NOTE — Assessment & Plan Note (Addendum)
Echocardiogram from 06/2022 with preserved LV systolic function with EF 65 to 70%, mild LVH, RV systolic function preserved. LA with mild to moderate dilatation. 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 Trivial MR and TR.   Patient clinically hypovolemic.  Plan to hold on diuretic therapy for now (he is on high doses of torsemide as outpatient). Cautious IV fluids with isotonic saline at 75 ml per hr.  Continue close blood pressure monitoring.  He has a very poor functional physical capacity, not good surgical candidate at this point.   Patient has the diagnosis of paroxysmal atrial fibrillation, currently he is on sinus rhythm and he has been of AV blocking agents or anticoagulation.

## 2022-12-03 NOTE — Assessment & Plan Note (Signed)
COPD with no exacerbation.  Plan to continue supplemental 02 per [place] to keep 02 saturation 88% or greater.  Continue with bronchodilator therapy.

## 2022-12-03 NOTE — ED Triage Notes (Addendum)
Pt bib [facility] from [facility]. Pt was seen by hospice nurse this ans she recommended pt come to hospital for evaluation. Pt is complaing of RUQ pain. Pt states he vomited twice yesterday, has been nauseous today. Pt states he started hurting around 1300 yesterday, pt is on 3L of oxygen which is normal for him. Pt states he had a BM last night.

## 2022-12-04 ENCOUNTER — Inpatient Hospital Stay (HOSPITAL_COMMUNITY): Payer: Medicare Other

## 2022-12-04 DIAGNOSIS — K810 Acute cholecystitis: Secondary | ICD-10-CM | POA: Diagnosis not present

## 2022-12-04 HISTORY — PX: IR PERC CHOLECYSTOSTOMY: IMG2326

## 2022-12-04 LAB — CBC
HCT: 50.2 % (ref 39.0–52.0)
HCT: 48.8 % (ref 39.0–52.0)
HCT: 50.6 % (ref 39.0–52.0)
Hemoglobin: 15.3 g/dL (ref 13.0–17.0)
Hemoglobin: 15 g/dL (ref 13.0–17.0)
Hemoglobin: 15.2 g/dL (ref 13.0–17.0)
MCH: 30.2 pg (ref 26.0–34.0)
MCH: 30.9 pg (ref 26.0–34.0)
MCH: 30.7 pg (ref 26.0–34.0)
MCHC: 30.5 g/dL (ref 30.0–36.0)
MCHC: 30.7 g/dL (ref 30.0–36.0)
MCHC: 30 g/dL (ref 30.0–36.0)
MCV: 99.2 fL (ref 80.0–100.0)
MCV: 100.4 fL — ABNORMAL HIGH (ref 80.0–100.0)
MCV: 102.2 fL — ABNORMAL HIGH (ref 80.0–100.0)
Platelets: 198 K/uL (ref 150–400)
Platelets: 210 10*3/uL (ref 150–400)
Platelets: 192 10*3/uL (ref 150–400)
RBC: 5.06 MIL/uL (ref 4.22–5.81)
RBC: 4.86 MIL/uL (ref 4.22–5.81)
RBC: 4.95 MIL/uL (ref 4.22–5.81)
RDW: 17.4 % — ABNORMAL HIGH (ref 11.5–15.5)
RDW: 17.6 % — ABNORMAL HIGH (ref 11.5–15.5)
RDW: 17.6 % — ABNORMAL HIGH (ref 11.5–15.5)
WBC: 35.2 K/uL — ABNORMAL HIGH (ref 4.0–10.5)
WBC: 41.8 10*3/uL — ABNORMAL HIGH (ref 4.0–10.5)
WBC: 34.5 10*3/uL — ABNORMAL HIGH (ref 4.0–10.5)
nRBC: 0 % (ref 0.0–0.2)
nRBC: 0 % (ref 0.0–0.2)
nRBC: 0 % (ref 0.0–0.2)

## 2022-12-04 LAB — COMPREHENSIVE METABOLIC PANEL
ALT: 18 U/L (ref 0–44)
ALT: 17 U/L (ref 0–44)
AST: 25 U/L (ref 15–41)
AST: 26 U/L (ref 15–41)
Albumin: 1.8 g/dL — ABNORMAL LOW (ref 3.5–5.0)
Albumin: 1.7 g/dL — ABNORMAL LOW (ref 3.5–5.0)
Alkaline Phosphatase: 143 U/L — ABNORMAL HIGH (ref 38–126)
Alkaline Phosphatase: 209 U/L — ABNORMAL HIGH (ref 38–126)
Anion gap: 11 (ref 5–15)
Anion gap: 18 — ABNORMAL HIGH (ref 5–15)
BUN: 33 mg/dL — ABNORMAL HIGH (ref 8–23)
BUN: 36 mg/dL — ABNORMAL HIGH (ref 8–23)
CO2: 31 mmol/L (ref 22–32)
CO2: 28 mmol/L (ref 22–32)
Calcium: 8.4 mg/dL — ABNORMAL LOW (ref 8.9–10.3)
Calcium: 8.5 mg/dL — ABNORMAL LOW (ref 8.9–10.3)
Chloride: 96 mmol/L — ABNORMAL LOW (ref 98–111)
Chloride: 94 mmol/L — ABNORMAL LOW (ref 98–111)
Creatinine, Ser: 1.43 mg/dL — ABNORMAL HIGH (ref 0.61–1.24)
Creatinine, Ser: 1.73 mg/dL — ABNORMAL HIGH (ref 0.61–1.24)
GFR, Estimated: 48 mL/min — ABNORMAL LOW (ref >60)
GFR, Estimated: 38 mL/min — ABNORMAL LOW (ref >60)
Glucose, Bld: 140 mg/dL — ABNORMAL HIGH (ref 70–99)
Glucose, Bld: 86 mg/dL (ref 70–99)
Potassium: 3.8 mmol/L (ref 3.5–5.1)
Potassium: 3.5 mmol/L (ref 3.5–5.1)
Sodium: 138 mmol/L (ref 135–145)
Sodium: 140 mmol/L (ref 135–145)
Total Bilirubin: 3.6 mg/dL — ABNORMAL HIGH (ref 0.3–1.2)
Total Bilirubin: 4.2 mg/dL — ABNORMAL HIGH (ref 0.3–1.2)
Total Protein: 6.4 g/dL — ABNORMAL LOW (ref 6.5–8.1)
Total Protein: 6.3 g/dL — ABNORMAL LOW (ref 6.5–8.1)

## 2022-12-04 LAB — BASIC METABOLIC PANEL WITH GFR
Anion gap: 18 — ABNORMAL HIGH (ref 5–15)
BUN: 31 mg/dL — ABNORMAL HIGH (ref 8–23)
CO2: 26 mmol/L (ref 22–32)
Calcium: 8.5 mg/dL — ABNORMAL LOW (ref 8.9–10.3)
Chloride: 93 mmol/L — ABNORMAL LOW (ref 98–111)
Creatinine, Ser: 1.34 mg/dL — ABNORMAL HIGH (ref 0.61–1.24)
GFR, Estimated: 52 mL/min — ABNORMAL LOW (ref >60)
Glucose, Bld: 115 mg/dL — ABNORMAL HIGH (ref 70–99)
Potassium: 4.3 mmol/L (ref 3.5–5.1)
Sodium: 137 mmol/L (ref 135–145)

## 2022-12-04 LAB — MAGNESIUM: Magnesium: 1.7 mg/dL (ref 1.7–2.4)

## 2022-12-04 LAB — TROPONIN I (HIGH SENSITIVITY): Troponin I (High Sensitivity): 22 ng/L — ABNORMAL HIGH (ref <18)

## 2022-12-04 LAB — MRSA NEXT GEN BY PCR, NASAL: MRSA by PCR Next Gen: DETECTED — AB

## 2022-12-04 LAB — LACTIC ACID, PLASMA
Lactic Acid, Venous: 3.9 mmol/L (ref 0.5–1.9)
Lactic Acid, Venous: 2.7 mmol/L (ref 0.5–1.9)

## 2022-12-04 MED ORDER — MIDODRINE HCL 5 MG PO TABS
5.0000 mg | ORAL_TABLET | Freq: Three times a day (TID) | ORAL | Status: DC
  Administered 2022-12-04 – 2022-12-10 (×18): 5 mg via ORAL
  Filled 2022-12-04 (×17): qty 1

## 2022-12-04 MED ORDER — LIDOCAINE HCL 1 % IJ SOLN
INTRAMUSCULAR | Status: AC
  Filled 2022-12-04: qty 20

## 2022-12-04 MED ORDER — KETOROLAC TROMETHAMINE 15 MG/ML IJ SOLN
15.0000 mg | Freq: Once | INTRAMUSCULAR | Status: AC
  Administered 2022-12-04: 15 mg via INTRAVENOUS
  Filled 2022-12-04: qty 1

## 2022-12-04 MED ORDER — CHLORHEXIDINE GLUCONATE CLOTH 2 % EX PADS
6.0000 | MEDICATED_PAD | Freq: Every day | CUTANEOUS | Status: AC
  Administered 2022-12-05 – 2022-12-09 (×5): 6 via TOPICAL

## 2022-12-04 MED ORDER — MUPIROCIN 2 % EX OINT
1.0000 | TOPICAL_OINTMENT | Freq: Two times a day (BID) | CUTANEOUS | Status: AC
  Administered 2022-12-04 – 2022-12-09 (×10): 1 via NASAL
  Filled 2022-12-04 (×5): qty 22

## 2022-12-04 MED ORDER — NOREPINEPHRINE 4 MG/250ML-% IV SOLN
2.0000 ug/min | INTRAVENOUS | Status: DC
  Administered 2022-12-04: 2 ug/min via INTRAVENOUS

## 2022-12-04 MED ORDER — NOREPINEPHRINE 4 MG/250ML-% IV SOLN
0.0000 ug/min | INTRAVENOUS | Status: DC
  Administered 2022-12-04 (×2): 20 ug/min via INTRAVENOUS
  Administered 2022-12-05: 6 ug/min via INTRAVENOUS
  Administered 2022-12-05: 16 ug/min via INTRAVENOUS
  Administered 2022-12-05: 6 ug/min via INTRAVENOUS
  Administered 2022-12-06: 2 ug/min via INTRAVENOUS
  Filled 2022-12-04 (×2): qty 250
  Filled 2022-12-04: qty 500
  Filled 2022-12-04 (×2): qty 250

## 2022-12-04 MED ORDER — SODIUM CHLORIDE 0.9 % IV BOLUS
500.0000 mL | Freq: Once | INTRAVENOUS | Status: AC
  Administered 2022-12-04: 500 mL via INTRAVENOUS

## 2022-12-04 MED ORDER — MIDAZOLAM HCL 2 MG/2ML IJ SOLN
INTRAMUSCULAR | Status: AC
  Filled 2022-12-04: qty 2

## 2022-12-04 MED ORDER — SODIUM CHLORIDE 0.9 % IV SOLN
250.0000 mL | INTRAVENOUS | Status: DC
  Administered 2022-12-04: 250 mL via INTRAVENOUS

## 2022-12-04 MED ORDER — NOREPINEPHRINE 4 MG/250ML-% IV SOLN
INTRAVENOUS | Status: AC
  Filled 2022-12-04: qty 250

## 2022-12-04 MED ORDER — ENOXAPARIN SODIUM 40 MG/0.4ML IJ SOSY
40.0000 mg | PREFILLED_SYRINGE | INTRAMUSCULAR | Status: DC
  Administered 2022-12-04 – 2022-12-10 (×7): 40 mg via SUBCUTANEOUS
  Filled 2022-12-04 (×7): qty 0.4

## 2022-12-04 MED ORDER — HYDROCORTISONE SOD SUC (PF) 100 MG IJ SOLR
100.0000 mg | Freq: Three times a day (TID) | INTRAMUSCULAR | Status: DC
  Administered 2022-12-04 – 2022-12-06 (×6): 100 mg via INTRAVENOUS
  Filled 2022-12-04 (×6): qty 2

## 2022-12-04 MED ORDER — VASOPRESSIN 20 UNITS/100 ML INFUSION FOR SHOCK
0.0400 [IU]/min | INTRAVENOUS | Status: DC
  Administered 2022-12-04 – 2022-12-06 (×5): 0.04 [IU]/min via INTRAVENOUS
  Filled 2022-12-04 (×6): qty 100

## 2022-12-04 MED ORDER — SODIUM CHLORIDE 0.9 % IV BOLUS
250.0000 mL | Freq: Once | INTRAVENOUS | Status: AC
  Administered 2022-12-04: 250 mL via INTRAVENOUS

## 2022-12-04 MED ORDER — MAGNESIUM SULFATE 2 GM/50ML IV SOLN
2.0000 g | Freq: Once | INTRAVENOUS | Status: AC
  Administered 2022-12-04: 2 g via INTRAVENOUS
  Filled 2022-12-04: qty 50

## 2022-12-04 MED ORDER — FENTANYL CITRATE (PF) 100 MCG/2ML IJ SOLN
INTRAMUSCULAR | Status: AC
  Filled 2022-12-04: qty 2

## 2022-12-04 MED ORDER — ALBUMIN HUMAN 25 % IV SOLN
25.0000 g | Freq: Four times a day (QID) | INTRAVENOUS | Status: AC
  Administered 2022-12-04 – 2022-12-05 (×4): 25 g via INTRAVENOUS
  Filled 2022-12-04 (×4): qty 100

## 2022-12-04 MED ORDER — CHLORHEXIDINE GLUCONATE CLOTH 2 % EX PADS
6.0000 | MEDICATED_PAD | Freq: Every day | CUTANEOUS | Status: DC
  Administered 2022-12-04: 6 via TOPICAL

## 2022-12-04 MED ORDER — SODIUM CHLORIDE 0.9 % IV BOLUS
1000.0000 mL | Freq: Once | INTRAVENOUS | Status: AC
  Administered 2022-12-04: 1000 mL via INTRAVENOUS

## 2022-12-04 MED ORDER — IOHEXOL 300 MG/ML  SOLN
50.0000 mL | Freq: Once | INTRAMUSCULAR | Status: AC | PRN
  Administered 2022-12-04: 5 mL

## 2022-12-04 MED ORDER — ENOXAPARIN SODIUM 60 MG/0.6ML IJ SOSY
0.5000 mg/kg | PREFILLED_SYRINGE | INTRAMUSCULAR | Status: DC
  Filled 2022-12-04: qty 0.6

## 2022-12-04 MED ORDER — IOHEXOL 300 MG/ML  SOLN: 50.0000 mL | Freq: Once | INTRAMUSCULAR | Status: DC | PRN

## 2022-12-04 MED ORDER — MIDAZOLAM HCL 2 MG/2ML IJ SOLN
INTRAMUSCULAR | Status: AC | PRN
Start: 2022-12-04 — End: 2022-12-04
  Administered 2022-12-04: 1 mg via INTRAVENOUS

## 2022-12-04 NOTE — Progress Notes (Signed)
Fentanyl patch removed from Right upper chest.

## 2022-12-04 NOTE — Consult Note (Signed)
NAME:  Vernon Campbell, MRN:  [id number], DOB:  05/26/1940, LOS: 1 ADMISSION DATE:  12/03/2022, CONSULTATION DATE: 12/04/2022 REFERRING MD: Pamella Pert MD, CHIEF COMPLAINT: 12/04/2022  History of Present Illness:   82 year old with history of CHF, COPD, pulmonary hypertension with cor pulmonale, paroxysmal atrial fibrillation presenting with gangrenous cholecystitis.  He is not a surgical candidate and had percutaneous cholecystostomy done by IR on 9/14.  Postprocedure he is hypotensive and PCCM consulted for management.  Wants water, otherwise feels okay, no dizziness, chest pain or SOB.  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   Significant Hospital Events: Including procedures, antibiotic start and stop dates in addition to other pertinent events   9/13 admit 9/14 per chole tube, ICU for pressors  Interim History / Subjective:  Looks suprisingly  good   Objective   Blood pressure (!) 64/46, pulse (!) 113, temperature 97.7 F (36.5 C), temperature source Oral, resp. rate 16, height 5\' 9"  (1.753 m), weight 107.1 kg, SpO2 98%.        Intake/Output Summary (Last 24 hours) at 12/04/2022 1251 Last data filed at 12/04/2022 0533 Gross per 24 hour  Intake --  Output 1450 ml  Net -1450 ml   Filed Weights   12/03/22 1051 12/04/22 0420  Weight: 127.5 kg 107.1 kg    Examination: Gen:      No acute distress, obese HEENT:  EOMI, mild icterus Neck:     No masses; no thyromegaly Lungs:    Clear to auscultation bilaterally; normal respiratory effort, mildy tachypneic CV:         Irregular rate and rhythm; no murmurs Abd:      Upper quadrant percutaneous drain w/ bilious output, mild tenderness Ext:    No edema; adequate peripheral perfusion Skin:      Warm and dry; no rash Neuro: alert and oriented x 3  Labs/imaging reviewed Significant for AKI, leukocytosis  Resolved Hospital Problem list   N/A  Assessment & Plan:  Septic shock secondary to gangrenous cholecystitis s/p perc chole drain Has baseline OSA/?OHS overlap on HOT and aortic stenosis with recurrent issues with fluid retention AKI on CKD3a Question of subtle colitis but no symptoms DNR Hx Afib Hypokalemia- being repleted - Give liter of fluid and 100g 25% albumin - Levophed for MAP 65, will add vaso, stress steroids - Check CVP - Zosyn - BIPAP at bedtime - Will need diuresis once through vasoplegic stage - Monitor chole drain output; OP IR f/u - DNR but okay with aggressive care otherwise; if deteriorates should probably be evaluated for hospice  Best Practice (right click and "Reselect all SmartList Selections" daily)   Diet/type: clear liquids DVT prophylaxis: LMWH GI prophylaxis: N/A Lines: Central line Foley:  N/A Code Status:  DNR  Last date of multidisciplinary goals of care discussion [Pending]  Labs   CBC: Recent Labs  Lab 12/03/22 1122  12/04/22 0936 12/04/22 1357  WBC 20.4* 41.8* 34.5*  NEUTROABS 17.0*  --   --   HGB 15.5 15.0 15.2  HCT 50.1 48.8 50.6  MCV 99.4 100.4* 102.2*  PLT 218 210 192    Basic Metabolic Panel: Recent Labs  Lab 12/03/22 1122 12/04/22 0936  NA 133* 138  K 3.2* 3.8  CL 91* 96*  CO2 33* 31  GLUCOSE 153* 140*  BUN 32* 33*  CREATININE 1.12 1.43*  CALCIUM 8.4* 8.4*  MG  --  1.7   GFR: Estimated Creatinine Clearance: 46.4 mL/min (A) (by C-G formula based on SCr of 1.43 mg/dL (H)). Recent Labs  Lab 12/03/22 1122 12/03/22 1355 12/04/22 0936 12/04/22 1357  WBC 20.4*  --  41.8* 34.5*  LATICACIDVEN  --  1.4  --  3.9*    Liver Function Tests: Recent Labs  Lab 12/03/22 1122 12/04/22 0936  AST 25 25  ALT 20 18  ALKPHOS 151* 143*  BILITOT 3.2* 3.6*  PROT 7.0 6.4*  ALBUMIN 2.4* 1.8*   Recent Labs  Lab 12/03/22 1122  LIPASE 21   No results for input(s): "AMMONIA" in the last 168 hours.  ABG    Component Value Date/Time   PHART 7.3 (L) 09/27/2022 1300   PCO2ART 91 (HH) 09/27/2022 1300   PO2ART 84 09/27/2022 1300   HCO3 45.9 (H) 09/27/2022 1711   O2SAT 85.6 09/27/2022 1711     Coagulation Profile: Recent Labs  Lab 12/03/22 1737  INR 1.3*    Cardiac Enzymes: No results for input(s): "CKTOTAL", "CKMB", "CKMBINDEX", "TROPONINI" in the last 168 hours.  HbA1C: Hgb A1c MFr Bld  Date/Time Value Ref Range Status  12/12/2021 06:37 AM 5.5 4.8 - 5.6 % Final    Comment:    (NOTE) Pre diabetes:          5.7%-6.4%  Diabetes:              >6.4%  Glycemic control for   <7.0% adults with diabetes     CBG: No results for input(s): "GLUCAP" in the last 168 hours.  Review of Systems:    Positive Symptoms in bold:  Constitutional fevers, chills, weight loss, fatigue, anorexia, malaise  Eyes decreased vision, double vision, eye irritation  Ears, Nose, Mouth, Throat sore throat, trouble swallowing, sinus congestion  Cardiovascular chest pain, paroxysmal nocturnal  dyspnea, lower ext edema, palpitations   Respiratory SOB, cough, DOE, hemoptysis, wheezing  Gastrointestinal nausea, vomiting, diarrhea  Genitourinary burning with urination, trouble urinating  Musculoskeletal joint aches, joint swelling, back pain  Integumentary  rashes, skin lesions  Neurological focal weakness, focal numbness, trouble speaking, headaches  Psychiatric depression, anxiety, confusion  Endocrine polyuria, polydipsia, cold intolerance, heat intolerance  Hematologic abnormal bruising, abnormal bleeding, unexplained nose bleeds  Allergic/Immunologic recurrent infections, hives, swollen lymph nodes     Past Medical History:  He,  has a past medical history of Abnormal weight gain, Abnormality of gait, 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), Age-related physical debility, Aortic stenosis, Carpal tunnel syndrome, right, Chronic cor pulmonale (HCC), Chronic diastolic heart failure (HCC), Chronic respiratory failure with hypoxia (HCC), Combined forms of age-related cataract, bilateral, Constipation, chronic, Dermatochalasis of eyelid, Extreme obesity with alveolar hypoventilation (HCC), History of tobacco use, Hyperlipidemia, Hypermetropia, bilateral, 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   Surgical History:  History reviewed. No pertinent surgical history.   Social History:   reports that he has quit smoking. His smoking use included cigarettes. He has never used smokeless tobacco. He  reports that he does not currently use alcohol. He reports that he does not currently use drugs.   Family History:  His family history is not on file.   Allergies No Known Allergies   Home Medications  Prior to Admission medications   Medication Sig Start Date End Date Taking? Authorizing Provider  albuterol (PROVENTIL) (2.5 MG/3ML) 0.083% nebulizer solution Take 3 mLs (2.5 mg total) by nebulization every 2 (two) hours as needed for shortness of breath. 12/15/21  Yes Emokpae, Courage, MD  Amino Acids-Protein Hydrolys (FEEDING SUPPLEMENT, PRO-STAT 64,) LIQD Take 30 mLs by mouth daily.   Yes [provider]  ascorbic acid (VITAMIN C) 500 MG tablet Take 500 mg by mouth 2 (two) times daily.   Yes [provider]  fentaNYL (DURAGESIC) 25 MCG/HR Place 1 patch onto the skin every 3 (three) days.   Yes [provider]  fluticasone furoate-vilanterol (BREO ELLIPTA) 100-25 MCG/ACT AEPB Inhale 1 puff into the lungs daily. 12/16/21  Yes Shon Hale, MD  hydrOXYzine (ATARAX) 25 MG tablet Take 25 mg by mouth every 6 (six) hours as needed for itching.   Yes [provider]  ketoconazole (NIZORAL) 2 % shampoo Apply 1 application  topically See admin instructions. Apply to scalp (shampoo) topically during day shift every Tuesday and Friday for tinea versicolor (bath days)   Yes [provider]  lactulose (CHRONULAC) 10 GM/15ML solution Take 15 mLs by mouth daily.   Yes [provider]  levothyroxine (SYNTHROID) 175 MCG tablet Take 175 mcg by mouth daily before breakfast.   Yes [provider]  Menthol-Zinc Oxide (CALMOSEPTINE) 0.44-20.6 % OINT Apply 1 application  topically 3 (three) times daily.   Yes [provider]  Multiple Vitamin (MULTIVITAMIN) tablet Take 1 tablet by mouth daily.   Yes [provider]  ondansetron (ZOFRAN-ODT) 4 MG disintegrating tablet Take 4 mg by mouth See admin instructions. Give 4 mg every 4 hours as  needed for nausea, vomiting for up to 2 doses.   Yes [provider]  oxyCODONE (ROXICODONE) 15 MG immediate release tablet Take 15 mg by mouth every 4 (four) hours as needed for pain.   Yes [provider]  OXYGEN Inhale 3 L/min into the lungs continuous.   Yes [provider]  polyethylene glycol (MIRALAX / GLYCOLAX) 17 g packet Take 17 g by mouth daily.   Yes [provider]  potassium chloride SA (KLOR-CON) 20 MEQ tablet Take 20 mEq by mouth daily.   Yes [provider]  pramipexole (MIRAPEX) 1 MG tablet Take 1 tablet (1 mg total) by mouth at bedtime. 10/28/22  Yes Sherryll Burger, Pratik D, DO  senna (SENOKOT) 8.6 MG TABS tablet Take 17.2 mg by mouth 2 (two) times daily.   Yes [provider]  spironolactone (ALDACTONE) 25 MG tablet Take 25 mg by mouth daily.   Yes [provider]  spironolactone (ALDACTONE) 50 MG tablet Take 1 tablet (50 mg total) by mouth daily. 10/06/22 01/29/23 Yes Shah, Pratik D, DO  tamsulosin (FLOMAX) 0.4 MG CAPS capsule  Take 1 capsule (0.4 mg total) by mouth daily after supper. Patient taking differently: Take 0.4 mg by mouth at bedtime. 09/06/22  Yes Donnita Falls, FNP  Torsemide 40 MG TABS Take 40 mg by mouth See admin instructions. Take 60 mg every morning and 40 mg every evening Patient taking differently: Take 40 mg by mouth See admin instructions. 40 mg once daily at 1400. 10/06/22  Yes Shah, Pratik D, DO  Torsemide 60 MG TABS Take 60 mg by mouth daily before breakfast.   Yes [provider]  zinc sulfate 220 (50 Zn) MG capsule Take 220 mg by mouth daily.   Yes [provider]     Critical care time: 34 mins independent of procedures

## 2022-12-04 NOTE — Procedures (Signed)
Interventional Radiology Procedure Note  Procedure: Percutaneous transhepatic cholecystostomy tube placement  Complications: None  Estimated Blood Loss: None  Recommendations: - Return to room - Bile sample sent for culture  Signed,  Sterling Big, MD

## 2022-12-04 NOTE — Plan of Care (Signed)

## 2022-12-04 NOTE — Significant Event (Signed)
Rapid Response Event Note   Reason for Call :  Hypotension SBP 50s, desaturation 80s now requiring NRB  Initial Focused Assessment:  Patient sitting up in bed A&O in no apparent distress. Denies SOB, dizziness/lightheadedness, blurry vision. Skin warm/dry, +1 pitting edema present. Lungs clear/diminished, heart tones normal. Drain site with dressing CDI, dark biliary drainage. Abdomen distended/soft. Noted foley catheter present from out of hospital facility.   64/46 (53) HR 114 RR 21 O2 88% NRB (just placed prior to arrival)  Interventions:  [place] NS bolus 1L wide open  MD to bedside Levo gtt initiated and titrated CCM consult, to bedside  73/57 (64) en route to 2H on levo. 98% NRB  Noted patient is DNR/DNI; pt agreeable to ICU level of care for BP medications.   Plan of Care:  Begin levo gtt, transfer ICU 2H19.  Event Summary:  MD Notified: C. Elvera Lennox MD, Belva Crome MD Call Time: [phone] Arrival Time: 1245 End Time: 1345  Truddie Crumble, RN

## 2022-12-04 NOTE — Consult Note (Signed)
Chief Complaint: Patient was seen in consultation today for percutaneous cholecystostomy Chief Complaint  Patient presents with   Abdominal Pain     Referring Physician(s): Bridges,L  Supervising Physician: Malachy Moan  Patient Status: [facility] - In-pt  History of Present Illness: Vernon Campbell is an 81 y.o. male hospice patient and resident at [facility] nursing home with past medical history significant for aortic stenosis, chronic cor pulmonale, CHF, obesity, hyperlipidemia, hypothyroidism, sleep apnea, osteoarthritis, paroxysmal atrial fibrillation, venous insufficiency, depression, vitamin D deficiency, hypertension, and COPD.  Patient was admitted to [facility] yesterday with persistent moderate to severe right upper quadrant pain.  Imaging revealed gangrenous acute cholecystitis.  Patient was seen by surgical team and deemed a poor surgical candidate.  Transferred to [facility] for further treatment; request now received for percutaneous cholecystostomy.  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 No pertinent surgical history.  Allergies: Patient has no known allergies.  Medications: Prior to Admission medications   Medication Sig Start Date End Date Taking? Authorizing Provider  albuterol (PROVENTIL) (2.5 MG/3ML) 0.083% nebulizer solution Take 3 mLs (2.5 mg total) by nebulization every 2 (two) hours as needed for shortness of breath. 12/15/21  Yes Emokpae, Courage, MD  Amino Acids-Protein Hydrolys (FEEDING SUPPLEMENT, PRO-STAT 64,) LIQD Take 30 mLs by mouth daily.   Yes [provider]  ascorbic acid (VITAMIN C) 500 MG tablet Take 500 mg by mouth 2 (two) times daily.   Yes [provider]  fentaNYL (DURAGESIC) 25 MCG/HR Place 1 patch onto the skin every 3 (three) days.   Yes [provider]  fluticasone furoate-vilanterol (BREO ELLIPTA) 100-25 MCG/ACT AEPB Inhale 1 puff into the lungs daily. 12/16/21  Yes Shon Hale, MD  hydrOXYzine (ATARAX) 25 MG tablet Take 25 mg by mouth every 6 (six) hours as needed for itching.   Yes [provider]  ketoconazole (NIZORAL) 2 % shampoo Apply 1 application  topically See admin instructions. Apply to scalp (shampoo) topically during day shift every Tuesday and Friday for tinea versicolor (bath days)   Yes [provider]  lactulose (CHRONULAC) 10 GM/15ML solution Take 15 mLs by mouth daily.   Yes [provider]  levothyroxine (SYNTHROID) 175 MCG tablet Take 175 mcg by mouth daily before breakfast.   Yes [provider]  Menthol-Zinc Oxide (CALMOSEPTINE) 0.44-20.6 % OINT Apply 1 application  topically 3 (three) times daily.   Yes [provider]  Multiple Vitamin (MULTIVITAMIN) tablet Take 1 tablet by mouth daily.   Yes [provider]  ondansetron (ZOFRAN-ODT) 4 MG disintegrating tablet Take 4 mg by mouth See admin instructions. Give 4 mg every 4 hours as needed for nausea, vomiting for up to 2 doses.   Yes [provider]  oxyCODONE (ROXICODONE) 15 MG immediate release tablet Take 15 mg by mouth every 4 (four) hours as needed for pain.   Yes [provider]  OXYGEN Inhale 3 L/min into the lungs continuous.   Yes [provider]  polyethylene glycol (MIRALAX / GLYCOLAX) 17 g packet Take 17 g by mouth daily.   Yes [provider]  potassium chloride SA (KLOR-CON) 20 MEQ tablet Take 20 mEq by mouth daily.   Yes [provider]  pramipexole (MIRAPEX) 1 MG tablet Take 1 tablet (1 mg total) by mouth at bedtime. 10/28/22  Yes Sherryll Burger, Pratik D, DO  senna (SENOKOT) 8.6 MG TABS tablet Take 17.2 mg by mouth 2 (two) times daily.   Yes [provider]  spironolactone (ALDACTONE) 25 MG tablet Take 25 mg by mouth daily.   Yes [provider]  spironolactone (ALDACTONE) 50 MG tablet Take 1 tablet (50 mg total) by mouth daily. 10/06/22 01/29/23 Yes Shah, Pratik D, DO  tamsulosin (FLOMAX) 0.4 MG CAPS capsule Take 1 capsule (0.4 mg total) by mouth daily after supper. Patient taking differently: Take 0.4 mg by mouth at bedtime. 09/06/22  Yes Donnita Falls, FNP  Torsemide 40 MG TABS Take 40 mg by mouth See admin instructions. Take 60 mg every morning and 40 mg every evening Patient taking differently: Take 40 mg by mouth See admin instructions. 40 mg once daily at 1400. 10/06/22  Yes Shah, Pratik D, DO  Torsemide 60 MG TABS Take 60 mg by mouth daily before breakfast.   Yes [provider]  zinc sulfate 220 (50 Zn) MG capsule Take 220 mg by mouth daily.   Yes [provider]     History reviewed. No pertinent family history.  Social History   Socioeconomic History   Marital status: Single    Spouse name: Not on file   Number of children: Not on file    Years of education: Not on file   Highest education level: Not on file  Occupational History   Not on file  Tobacco Use   Smoking status: Former    Types: Cigarettes   Smokeless tobacco: Never  Vaping Use   Vaping status: Never Used  Substance and Sexual Activity   Alcohol use: Not Currently   Drug use: Not Currently   Sexual activity: Not Currently  Other Topics Concern   Not on file  Social History Narrative   Not on file   Social Determinants of Health   Financial Resource Strain: Not on file  Food Insecurity: No Food Insecurity (10/23/2022)   Hunger Vital Sign    Worried About Running Out of Food in the Last Year: Never true    Ran Out of Food in the Last Year: Never true  Transportation Needs: No Transportation Needs (10/23/2022)   PRAPARE - Administrator, Civil Service (Medical): No    Lack of Transportation (Non-Medical): No  Physical  Activity: Not on file  Stress: Not on file  Social Connections: Unknown (07/31/2021)   Received from [facility], [facility]   Social Network    Social Network: Not on file      Review of Systems see above; currently denies fever, headache, chest pain, worsening dyspnea, cough, back pain, nausea, vomiting or bleeding.  Vital Signs: BP (!) 84/64 (BP Location: Left Arm)   Pulse (!) 105   Temp 97.7 F (36.5 C) (Oral)   Resp 16   Ht 5\' 9"  (1.753 m)   Wt 236 lb 1.8 oz (107.1 kg)   SpO2 94%   BMI 34.87 kg/m   Advance Care Plan: No documents on file   Physical Exam: Awake, alert.  Chest with slightly diminished breath sounds bases.  Heart with tachycardic rate but normal rhythm,+murmur.  Abdomen obese, soft, few bowel sounds, moderate right upper quadrant pain to palpation.  Changes of venous insufficiency/edema bilateral lower extremities.  Subcutaneous nodule of left wrist.  Imaging: CT ABDOMEN PELVIS W CONTRAST  Result Date: 12/03/2022 CLINICAL DATA:  Vomiting.  Nausea EXAM: CT ABDOMEN AND PELVIS WITH CONTRAST  TECHNIQUE: Multidetector CT imaging of the abdomen and pelvis was performed using the standard protocol following bolus administration of intravenous contrast. RADIATION DOSE REDUCTION: This exam was performed according to the departmental dose-optimization program which includes automated exposure control, adjustment of the mA and/or kV according to patient size and/or use of iterative reconstruction technique. CONTRAST:  OMNIPAQUE IOHEXOL 300 MG/ML  SOLN COMPARISON:  None FINDINGS: Lower chest: Heart is enlarged. There are significant coronary artery calcifications. There also calcifications along the aortic valve. Basilar atelectasis. Tiny effusions. Left-greater-than-right. Hepatobiliary: Markedly enlarged gallbladder with wall thickening, edema. There also areas of poor enhancement along the gallbladder wall. There is also air towards the fundus in the wall itself. Stones are seen dependently as well as a stone in what appears to be the cystic duct on coronal series 5, image 65. Findings are worrisome for developing emphysematous cholecystitis. There are some adjacent reactive nodes identified. Significant inflammatory stranding and fluid. Periportal edema. No space-occupying separate liver lesion.  Patent portal vein. Pancreas: Unremarkable. No pancreatic ductal dilatation or surrounding inflammatory changes. Spleen: Normal in size without focal abnormality. Adrenals/Urinary Tract: Adrenal glands are preserved. No collecting system dilatation. There is a lower pole slightly exophytic lesion on the right measuring 2.8 cm. Hounsfield unit on portal venous phase of 47. On delayed 68. Indeterminate lesion. Please correlate with any prior or dedicated workup when appropriate. A postcontrast study after clearance of the current contrast may be useful to assess for density without contrast. Also a tiny low-attenuation lesion along the anterior aspect of the left mid kidney as well which is too small to  completely characterize. Attention on follow up examination as well. The ureters have normal course and caliber extending down to the bladder. Foley catheter in the underdistended urinary bladder. Stomach/Bowel: No oral contrast. The stomach is relatively collapsed. Third portion duodenal diverticula. Small bowel is nondilated. Terminal ileal small bowel stool appearance, nonspecific. The large bowel has moderate stool diffusely is nondilated. There is some redundant course of the sigmoid colon with mild wall thickening along the rectosigmoid colon, nonspecific. A subtle colitis is not excluded. Vascular/Lymphatic: Normal caliber aorta and IVC with scattered vascular calcifications along the aorta and branch vessels. Few prominent upper abdominal nodes. No clear pathologically enlarged lymph nodes identified. Reproductive: Prominent prostate. Other: Mesenteric stranding identified. Once again there is fluid in the right  upper quadrant surrounding the liver and extending along the right pericolic gutter. Musculoskeletal: Curvature and degenerative changes along the spine. Degenerative changes of the pelvis. Sclerotic focus along the right pubic bone on series 2, image 89. Possibly a bone island but there is a differential. Critical Value/emergent results were called by telephone at the time of interpretation on 12/03/2022 at 11:58 am to provider Pricilla Loveless , who verbally acknowledged these results. IMPRESSION: Dilated gallbladder with severe inflammatory changes, wall thickening and fluid. There are areas of poor enhancement along the wall with the air in the wall towards the fundus. Numerous stones including what may be a stone in the cystic duct. Findings are consistent with cholecystitis and with air and poor enhancement of the wall, emphysematous/gangrenous gallbladder. Scattered colonic stool. There is some wall thickening along underdistended sigmoid colon. This could be passive although a subtle colitis is  not excluded. Please correlate with any symptoms. Indeterminate right-sided renal lesion. Recommend comparison to prior or follow up study after clearance of the contrast to assess for precontrast density. Lung base opacities with trace pleural fluid, left-greater-than-right. Recommend follow-up. Significant calcifications along the area of the aortic valve. Electronically Signed   By: Karen Kays M.D.   On: 12/03/2022 15:00   DG Chest Portable 1 View  Result Date: 12/03/2022 CLINICAL DATA:  Vomiting EXAM: PORTABLE CHEST 1 VIEW COMPARISON:  X-ray 10/23/2022 FINDINGS: Underinflation. Small left effusion with some adjacent opacity. No pneumothorax. Enlarged cardiopericardial silhouette with vascular congestion. Dense left upper lobe lung nodule consistent with old granulomatous disease. Film is under penetrated. IMPRESSION: Underinflation.  Small left effusion with some adjacent opacity. Enlarged heart with some vascular congestion. Electronically Signed   By: Karen Kays M.D.   On: 12/03/2022 13:11    Labs:  CBC: Recent Labs    10/26/22 0428 10/27/22 0425 10/28/22 0415 12/03/22 1122  WBC 15.1* 11.9* 14.4* 20.4*  HGB 13.6 13.6 13.9 15.5  HCT 44.2 44.5 45.6 50.1  PLT 234 230 239 218    COAGS: Recent Labs    12/03/22 1737  INR 1.3*    BMP: Recent Labs    10/26/22 0428 10/27/22 0425 10/28/22 0415 12/03/22 1122  NA 134* 135 136 133*  K 3.7 3.8 3.9 3.2*  CL 92* 95* 98 91*  CO2 29 30 29  33*  GLUCOSE 114* 119* 136* 153*  BUN 34* 36* 35* 32*  CALCIUM 8.5* 8.7* 9.0 8.4*  CREATININE 1.53* 1.58* 1.43* 1.12  GFRNONAA 45* 43* 48* >60    LIVER FUNCTION TESTS: Recent Labs    12/11/21 1305 12/14/21 0414 12/15/21 0408 06/17/22 1531 10/23/22 1409 12/03/22 1122  BILITOT 2.2*  --   --  1.7* 2.9* 3.2*  AST 21  --   --  22 20 25   ALT 17  --   --  17 20 20   ALKPHOS 105  --   --  105 158* 151*  PROT 6.8  --   --  6.6 7.1 7.0  ALBUMIN 3.6   < > 3.4* 3.7 3.2* 2.4*   < > = values in  this interval not displayed.    TUMOR MARKERS: No results for input(s): "AFPTM", "CEA", "CA199", "CHROMGRNA" in the last 8760 hours.  Assessment and Plan: 82 y.o. male hospice patient and resident at [facility] on home with past medical history significant for aortic stenosis, chronic cor pulmonale, CHF, obesity, hyperlipidemia, hypothyroidism, sleep apnea, osteoarthritis, paroxysmal atrial fibrillation, venous insufficiency, depression, vitamin D deficiency, hypertension, and COPD.  Patient  was admitted to [facility] yesterday with persistent moderate to severe right upper quadrant pain.  Imaging revealed gangrenous acute cholecystitis.  Patient was seen by surgical team and deemed a poor surgical candidate.  Transferred to [facility] for further treatment.  Request now received for percutaneous cholecystostomy.  Patient afebrile, BP soft, mildly tachycardic.  On IV Zosyn.  WBC 20.4, hemoglobin normal, platelets normal, creat nl, PT/INR 16.3/1.3;  imaging studies have been reviewed by Dr. Archer Asa.Risks and benefits discussed with the patient/son including bleeding, infection, damage to adjacent structures, bowel perforation/fistula connection, and sepsis.  All of the patient's questions were answered, patient is agreeable to proceed. Consent signed and in chart.  Procedure scheduled for today.    Thank you for this interesting consult.  I greatly enjoyed meeting Vernon Campbell and look forward to participating in their care.  A copy of this report was sent to the requesting provider on this date.  Electronically Signed: D. Jeananne Rama, PA-C 12/04/2022, 8:57 AM   I spent a total of  25 minutes   in face to face in clinical consultation, greater than 50% of which was counseling/coordinating care for percutaneous cholecystostomy

## 2022-12-04 NOTE — Progress Notes (Addendum)
PROGRESS NOTE  Vernon Campbell AOZ:[id number] DOB: 04/20/1940 DOA: 12/03/2022 PCP: System, Provider Not In   LOS: 1 day   Brief Narrative / Interim history: 82 year old male with history of CHF, cor pulmonale, pulmonary hypertension, COPD, chronic hypoxia, PAF, obesity comes to the hospital with abdominal pain.  Workup in the ED showed gangrenous cholecystitis.  General surgery was consulted and he was deemed not a good surgical candidate.  IR was consulted for percutaneous cholecystostomy  Subjective / 24h Interval events: Complains of significant right upper quadrant pain this morning.  Assesement and Plan: Principal Problem:   Acute gangrenous cholecystitis Active Problems:   Chronic respiratory failure with hypoxia (HCC)   Chronic diastolic CHF (congestive heart failure) (HCC)   Hypokalemia   Essential hypertension   Hypothyroidism   Hyperlipidemia   Obesity, Class III, BMI 40-49.9 (morbid obesity) (HCC)   Cellulitis  Principal problem Sepsis due to acute gangrenous cholecystitis -appreciate general surgery as well as interventional radiology.  He was started on Zosyn, continue.  To undergo percutaneous cholecystostomy -Still with soft blood pressures, bolus and add midodrine -Closely monitor  Addendum  Called at bedside ~ 12:45 pm due to significant hypotension, hypoxia following procedure. Patient seen and examined at bedside. He is alert, but significantly hypotensive. Clinically he is in septic shock post cholecystostomy. Administered fluids, started on pressors and moved to the ICU. Discussed with Dr Isaiah Serge. Updated son Cayson Weisenberger over the phone.  The patient is now critically ill with multiple organ system failure and requires high complexity decision making for assessment and support, frequent evaluation and titration of therapies, advanced monitoring, review of radiographic studies and interpretation of complex data.    Critical Care Time devoted to patient care services,  exclusive of separately billable procedures, described in this note is 35 minutes, 12:45 pm - 13:20 pm   Active problems COPD with chronic hypoxic respiratory failure-respiratory status appears stable, continue supplemental oxygen.  He has no wheezing  Chronic diastolic CHF- Echocardiogram from 06/2022 with preserved LV systolic function with EF 65 to 70%, mild LVH, RV systolic function preserved. LA with mild to moderate dilatation. 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 Trivial MR and TR.  -Clinically euvolemic at this point.  Hold diuretics due to soft blood pressures   Paroxysmal atrial fibrillation - currently he is on sinus rhythm and he has been off AV blocking agents or anticoagulation.    Hypokalemia -replete as indicated, continue to monitor.    Essential hypertension - Continue close blood pressure monitoring.  For now hold on antihypertensive medications and diuretics.    Hypothyroidism - Continue with levothyroxine.    Hyperlipidemia - Continue statin therapy.   Obesity, Class I - Calculated BMI is 34   Cellulitis - Patient has left wrist cyst cyst, soft nodule. Possible local infection.Continue close follow up while on antibiotic therapy. Does not appear to involve the wrist joint.     Scheduled Meds:  atorvastatin  20 mg Oral QPM   enoxaparin (LOVENOX) injection  0.5 mg/kg Subcutaneous Q24H   fluticasone furoate-vilanterol  1 puff Inhalation Daily   levothyroxine  175 mcg Oral Q0600   midodrine  5 mg Oral TID WC   pantoprazole  40 mg Oral Daily   pramipexole  1 mg Oral QHS   sodium chloride flush  3 mL Intravenous Q12H   tamsulosin  0.4 mg Oral QPM   Continuous Infusions:  sodium chloride     dextrose 5 % and 0.9 % NaCl 75 mL/hr at  12/04/22 0836   piperacillin-tazobactam (ZOSYN)  IV 3.375 g (12/04/22 0636)   sodium chloride     PRN Meds:.sodium chloride, acetaminophen **OR** acetaminophen, morphine injection, ondansetron **OR** ondansetron (ZOFRAN) IV,  oxyCODONE, sodium chloride flush  Current Outpatient Medications  Medication Instructions   albuterol (PROVENTIL) 2.5 mg, Nebulization, Every 2 hours PRN   Amino Acids-Protein Hydrolys (FEEDING SUPPLEMENT, PRO-STAT 64,) LIQD 30 mLs, Oral, Daily   ascorbic acid (VITAMIN C) 500 mg, Oral, 2 times daily   fentaNYL (DURAGESIC) 25 MCG/HR 1 patch, Transdermal, every 72 hours   fluticasone furoate-vilanterol (BREO ELLIPTA) 100-25 MCG/ACT AEPB 1 puff, Inhalation, Daily   hydrOXYzine (ATARAX) 25 mg, Oral, Every 6 hours PRN   ketoconazole (NIZORAL) 2 % shampoo 1 application , Topical, See admin instructions, Apply to scalp (shampoo) topically during day shift every Tuesday and Friday for tinea versicolor (bath days)   lactulose (CHRONULAC) 10 GM/15ML solution 15 mLs, Oral, Daily   levothyroxine (SYNTHROID) 175 mcg, Oral, Daily before breakfast   Menthol-Zinc Oxide (CALMOSEPTINE) 0.44-20.6 % OINT 1 application , Topical, 3 times daily   Multiple Vitamin (MULTIVITAMIN) tablet 1 tablet, Oral, Daily   ondansetron (ZOFRAN-ODT) 4 mg, Oral, See admin instructions, Give 4 mg every 4 hours as needed for nausea, vomiting for up to 2 doses.    oxyCODONE (ROXICODONE) 15 mg, Oral, Every 4 hours PRN   OXYGEN 3 L/min, Inhalation, Continuous   polyethylene glycol (MIRALAX / GLYCOLAX) 17 g, Oral, Daily   potassium chloride SA (KLOR-CON) 20 MEQ tablet 20 mEq, Oral, Daily   pramipexole (MIRAPEX) 1 mg, Oral, Daily at bedtime   senna (SENOKOT) 17.2 mg, Oral, 2 times daily   spironolactone (ALDACTONE) 50 mg, Oral, Daily   spironolactone (ALDACTONE) 25 mg, Oral, Daily   tamsulosin (FLOMAX) 0.4 mg, Oral, Daily after supper   Torsemide 40 mg, Oral, See admin instructions, Take 60 mg every morning and 40 mg every evening   Torsemide 60 mg, Oral, Daily before breakfast   zinc sulfate 220 mg, Oral, Daily    Diet Orders (From admission, onward)     Start     Ordered   12/03/22 1758  Diet NPO time specified Except for: Sips  with Meds  Diet effective now       Question:  Except for  Answer:  Sips with Meds   12/03/22 1757            DVT prophylaxis: SCDs Start: 12/03/22 1758   Lab Results  Component Value Date   PLT 218 12/03/2022      Code Status: Limited: Do not attempt resuscitation (DNR) -DNR-LIMITED -Do Not Intubate/DNI   Family Communication: no family at bedside   Status is: Inpatient Remains inpatient appropriate because: severity of illness Level of care: Med-Surg  Consultants:  IR Surgery  Objective: Vitals:   12/04/22 0420 12/04/22 0533 12/04/22 0640 12/04/22 0835  BP: (!) 82/67 97/70 90/64  (!) 84/64  Pulse: (!) 104 (!) 101 (!) 103 (!) 105  Resp: 20   16  Temp: 98.2 F (36.8 C)   97.7 F (36.5 C)  TempSrc:    Oral  SpO2: (!) 87% 95%  94%  Weight: 107.1 kg     Height:        Intake/Output Summary (Last 24 hours) at 12/04/2022 0929 Last data filed at 12/04/2022 0533 Gross per 24 hour  Intake --  Output 1450 ml  Net -1450 ml   Wt Readings from Last 3 Encounters:  12/04/22 107.1 kg  10/23/22  127.5 kg  10/04/22 122 kg    Examination:  Constitutional: NAD Eyes: no scleral icterus ENMT: Mucous membranes are moist.  Neck: normal, supple Respiratory: clear to auscultation bilaterally, no wheezing, no crackles.  Cardiovascular: Regular rate and rhythm, no murmurs / rubs / gallops. No LE edema.  Abdomen: RUQ tenderness elicited. No guarding Musculoskeletal: no clubbing / cyanosis.  Skin: no rashes Neurologic: non focal   Data Reviewed: I have independently reviewed following labs and imaging studies   CBC Recent Labs  Lab 12/03/22 1122  WBC 20.4*  HGB 15.5  HCT 50.1  PLT 218  MCV 99.4  MCH 30.8  MCHC 30.9  RDW 17.9*  LYMPHSABS 1.1  MONOABS 1.9*  EOSABS 0.0  BASOSABS 0.1    Recent Labs  Lab 12/03/22 1122 12/03/22 1355 12/03/22 1737  NA 133*  --   --   K 3.2*  --   --   CL 91*  --   --   CO2 33*  --   --   GLUCOSE 153*  --   --   BUN 32*   --   --   CREATININE 1.12  --   --   CALCIUM 8.4*  --   --   AST 25  --   --   ALT 20  --   --   ALKPHOS 151*  --   --   BILITOT 3.2*  --   --   ALBUMIN 2.4*  --   --   LATICACIDVEN  --  1.4  --   INR  --   --  1.3*    ------------------------------------------------------------------------------------------------------------------ No results for input(s): "CHOL", "HDL", "LDLCALC", "TRIG", "CHOLHDL", "LDLDIRECT" in the last 72 hours.  Lab Results  Component Value Date   HGBA1C 5.5 12/12/2021   ------------------------------------------------------------------------------------------------------------------ No results for input(s): "TSH", "T4TOTAL", "T3FREE", "THYROIDAB" in the last 72 hours.  Invalid input(s): "FREET3"  Cardiac Enzymes No results for input(s): "CKMB", "TROPONINI", "MYOGLOBIN" in the last 168 hours.  Invalid input(s): "CK" ------------------------------------------------------------------------------------------------------------------    Component Value Date/Time   BNP 93.0 10/23/2022 1408    CBG: No results for input(s): "GLUCAP" in the last 168 hours.  Recent Results (from the past 240 hour(s))  SARS Coronavirus 2 by RT PCR (hospital order, performed in [facility] hospital lab) *cepheid single result test* Anterior Nasal Swab     Status: None   Collection Time: 12/03/22  3:37 PM   Specimen: Anterior Nasal Swab  Result Value Ref Range Status   SARS Coronavirus 2 by RT PCR NEGATIVE NEGATIVE Final    Comment: (NOTE) SARS-CoV-2 target nucleic acids are NOT DETECTED.  The SARS-CoV-2 RNA is generally detectable in upper and lower respiratory specimens during the acute phase of infection. The lowest concentration of SARS-CoV-2 viral copies this assay can detect is 250 copies / mL. A negative result does not preclude SARS-CoV-2 infection and should not be used as the sole basis for treatment or other patient management decisions.  A negative result may  occur with improper specimen collection / handling, submission of specimen other than nasopharyngeal swab, presence of viral mutation(s) within the areas targeted by this assay, and inadequate number of viral copies (<250 copies / mL). A negative result must be combined with clinical observations, patient history, and epidemiological information.  Fact Sheet for Patients:   RoadLapTop.co.za  Fact Sheet for Healthcare Providers: http://kim-miller.com/  This test is not yet approved or  cleared by the [place] and has been authorized  for detection and/or diagnosis of SARS-CoV-2 by FDA under an Emergency Use Authorization (EUA).  This EUA will remain in effect (meaning this test can be used) for the duration of the COVID-19 declaration under Section 564(b)(1) of the Act, 21 U.S.C. section 360bbb-3(b)(1), unless the authorization is terminated or revoked sooner.  Performed at [facility], [address] [place], [place] [address]      Radiology Studies: CT ABDOMEN PELVIS W CONTRAST  Result Date: 12/03/2022 CLINICAL DATA:  Vomiting.  Nausea EXAM: CT ABDOMEN AND PELVIS WITH CONTRAST TECHNIQUE: Multidetector CT imaging of the abdomen and pelvis was performed using the standard protocol following bolus administration of intravenous contrast. RADIATION DOSE REDUCTION: This exam was performed according to the departmental dose-optimization program which includes automated exposure control, adjustment of the mA and/or kV according to patient size and/or use of iterative reconstruction technique. CONTRAST:  OMNIPAQUE IOHEXOL 300 MG/ML  SOLN COMPARISON:  None FINDINGS: Lower chest: Heart is enlarged. There are significant coronary artery calcifications. There also calcifications along the aortic valve. Basilar atelectasis. Tiny effusions. Left-greater-than-right. Hepatobiliary: Markedly enlarged gallbladder with wall thickening, edema. There  also areas of poor enhancement along the gallbladder wall. There is also air towards the fundus in the wall itself. Stones are seen dependently as well as a stone in what appears to be the cystic duct on coronal series 5, image 65. Findings are worrisome for developing emphysematous cholecystitis. There are some adjacent reactive nodes identified. Significant inflammatory stranding and fluid. Periportal edema. No space-occupying separate liver lesion.  Patent portal vein. Pancreas: Unremarkable. No pancreatic ductal dilatation or surrounding inflammatory changes. Spleen: Normal in size without focal abnormality. Adrenals/Urinary Tract: Adrenal glands are preserved. No collecting system dilatation. There is a lower pole slightly exophytic lesion on the right measuring 2.8 cm. Hounsfield unit on portal venous phase of 47. On delayed 51. Indeterminate lesion. Please correlate with any prior or dedicated workup when appropriate. A postcontrast study after clearance of the current contrast may be useful to assess for density without contrast. Also a tiny low-attenuation lesion along the anterior aspect of the left mid kidney as well which is too small to completely characterize. Attention on follow up examination as well. The ureters have normal course and caliber extending down to the bladder. Foley catheter in the underdistended urinary bladder. Stomach/Bowel: No oral contrast. The stomach is relatively collapsed. Third portion duodenal diverticula. Small bowel is nondilated. Terminal ileal small bowel stool appearance, nonspecific. The large bowel has moderate stool diffusely is nondilated. There is some redundant course of the sigmoid colon with mild wall thickening along the rectosigmoid colon, nonspecific. A subtle colitis is not excluded. Vascular/Lymphatic: Normal caliber aorta and IVC with scattered vascular calcifications along the aorta and branch vessels. Few prominent upper abdominal nodes. No clear  pathologically enlarged lymph nodes identified. Reproductive: Prominent prostate. Other: Mesenteric stranding identified. Once again there is fluid in the right upper quadrant surrounding the liver and extending along the right pericolic gutter. Musculoskeletal: Curvature and degenerative changes along the spine. Degenerative changes of the pelvis. Sclerotic focus along the right pubic bone on series 2, image 89. Possibly a bone island but there is a differential. Critical Value/emergent results were called by telephone at the time of interpretation on 12/03/2022 at 11:58 am to provider Pricilla Loveless , who verbally acknowledged these results. IMPRESSION: Dilated gallbladder with severe inflammatory changes, wall thickening and fluid. There are areas of poor enhancement along the wall with the air in the wall towards the  fundus. Numerous stones including what may be a stone in the cystic duct. Findings are consistent with cholecystitis and with air and poor enhancement of the wall, emphysematous/gangrenous gallbladder. Scattered colonic stool. There is some wall thickening along underdistended sigmoid colon. This could be passive although a subtle colitis is not excluded. Please correlate with any symptoms. Indeterminate right-sided renal lesion. Recommend comparison to prior or follow up study after clearance of the contrast to assess for precontrast density. Lung base opacities with trace pleural fluid, left-greater-than-right. Recommend follow-up. Significant calcifications along the area of the aortic valve. Electronically Signed   By: Karen Kays M.D.   On: 12/03/2022 15:00   DG Chest Portable 1 View  Result Date: 12/03/2022 CLINICAL DATA:  Vomiting EXAM: PORTABLE CHEST 1 VIEW COMPARISON:  X-ray 10/23/2022 FINDINGS: Underinflation. Small left effusion with some adjacent opacity. No pneumothorax. Enlarged cardiopericardial silhouette with vascular congestion. Dense left upper lobe lung nodule consistent with  old granulomatous disease. Film is under penetrated. IMPRESSION: Underinflation.  Small left effusion with some adjacent opacity. Enlarged heart with some vascular congestion. Electronically Signed   By: Karen Kays M.D.   On: 12/03/2022 13:11     Pamella Pert, MD, PhD [facility]  Between 7 am - 7 pm I am available, please contact me via Amion (for emergencies) or Securechat (non urgent messages)  Between 7 pm - 7 am I am not available, please contact night coverage MD/APP via Amion

## 2022-12-04 NOTE — Procedures (Signed)
Central Venous Catheter Insertion Procedure Note  Vernon Campbell  [id number]  08-15-1940  Date:12/04/22  Time:2:45 PM   Provider Performing:Muhammed Teutsch Erby Pian   Procedure: Insertion of Non-tunneled Central Venous [phone]) with US guidance ([id number])   Indication(s) Medication administration  Consent Verbal as was time-sensitive  Anesthesia Topical only with 1% lidocaine   Timeout Verified patient identification, verified procedure, site/side was marked, verified correct patient position, special equipment/implants available, medications/allergies/relevant history reviewed, required imaging and test results available.  Sterile Technique Maximal sterile technique including full sterile barrier drape, hand hygiene, sterile gown, sterile gloves, mask, hair covering, sterile ultrasound probe cover (if used).  Procedure Description Area of catheter insertion was cleaned with chlorhexidine and draped in sterile fashion.  With real-time ultrasound guidance a central venous catheter was placed into the left internal jugular vein. Nonpulsatile blood flow and easy flushing noted in all ports.  The catheter was sutured in place and sterile dressing applied.  Complications/Tolerance None; patient tolerated the procedure well. Chest X-ray is ordered to verify placement for internal jugular or subclavian cannulation.   Chest x-ray is not ordered for femoral cannulation.  EBL Minimal  Specimen(s) None

## 2022-12-04 NOTE — Progress Notes (Addendum)
Pt has indwelling urinary catheter.  Seems to have been placed prior to admission.  1730:  left message for pt's son to call unit back.  Wanted to make sure son was aware of pt being transferred to icu

## 2022-12-04 NOTE — Plan of Care (Signed)

## 2022-12-04 NOTE — Progress Notes (Signed)
Report called to nurse on 2N. Patient being transferred to 2N19.

## 2022-12-05 DIAGNOSIS — K810 Acute cholecystitis: Secondary | ICD-10-CM | POA: Diagnosis not present

## 2022-12-05 LAB — COMPREHENSIVE METABOLIC PANEL
ALT: 16 U/L (ref 0–44)
AST: 30 U/L (ref 15–41)
Albumin: 2.5 g/dL — ABNORMAL LOW (ref 3.5–5.0)
Alkaline Phosphatase: 155 U/L — ABNORMAL HIGH (ref 38–126)
Anion gap: 13 (ref 5–15)
BUN: 46 mg/dL — ABNORMAL HIGH (ref 8–23)
CO2: 26 mmol/L (ref 22–32)
Calcium: 7.9 mg/dL — ABNORMAL LOW (ref 8.9–10.3)
Chloride: 93 mmol/L — ABNORMAL LOW (ref 98–111)
Creatinine, Ser: 2.22 mg/dL — ABNORMAL HIGH (ref 0.61–1.24)
GFR, Estimated: 29 mL/min — ABNORMAL LOW (ref >60)
Glucose, Bld: 180 mg/dL — ABNORMAL HIGH (ref 70–99)
Potassium: 3.9 mmol/L (ref 3.5–5.1)
Sodium: 132 mmol/L — ABNORMAL LOW (ref 135–145)
Total Bilirubin: 5.3 mg/dL — ABNORMAL HIGH (ref 0.3–1.2)
Total Protein: 6.4 g/dL — ABNORMAL LOW (ref 6.5–8.1)

## 2022-12-05 LAB — BASIC METABOLIC PANEL
Anion gap: 25 — ABNORMAL HIGH (ref 5–15)
BUN: 54 mg/dL — ABNORMAL HIGH (ref 8–23)
CO2: 25 mmol/L (ref 22–32)
Calcium: 7.6 mg/dL — ABNORMAL LOW (ref 8.9–10.3)
Chloride: 85 mmol/L — ABNORMAL LOW (ref 98–111)
Creatinine, Ser: 2.6 mg/dL — ABNORMAL HIGH (ref 0.61–1.24)
GFR, Estimated: 24 mL/min — ABNORMAL LOW (ref >60)
Glucose, Bld: 251 mg/dL — ABNORMAL HIGH (ref 70–99)
Potassium: 3.5 mmol/L (ref 3.5–5.1)
Sodium: 135 mmol/L (ref 135–145)

## 2022-12-05 LAB — CBC
HCT: 40.8 % (ref 39.0–52.0)
Hemoglobin: 12.2 g/dL — ABNORMAL LOW (ref 13.0–17.0)
MCH: 30.5 pg (ref 26.0–34.0)
MCHC: 29.9 g/dL — ABNORMAL LOW (ref 30.0–36.0)
MCV: 102 fL — ABNORMAL HIGH (ref 80.0–100.0)
Platelets: 180 10*3/uL (ref 150–400)
RBC: 4 MIL/uL — ABNORMAL LOW (ref 4.22–5.81)
RDW: 17.5 % — ABNORMAL HIGH (ref 11.5–15.5)
WBC: 35.6 10*3/uL — ABNORMAL HIGH (ref 4.0–10.5)
nRBC: 0 % (ref 0.0–0.2)

## 2022-12-05 LAB — MAGNESIUM: Magnesium: 1.9 mg/dL (ref 1.7–2.4)

## 2022-12-05 MED ORDER — LACTATED RINGERS IV SOLN: INTRAVENOUS | Status: AC

## 2022-12-05 NOTE — Plan of Care (Signed)
progressing 

## 2022-12-05 NOTE — Progress Notes (Signed)
Referring Physician(s): Baltazar Apo  Supervising Physician: Malachy Moan  Patient Status:    Chief Complaint:  Cholecystitis, weakness  Subjective: Pt still feels weak, no appetite, has some mild RUQ discomfort; denies fever/chills, N/V or bleeding; remains hypotensive and was prior to GB drain yesterday   Allergies: Patient has no known allergies.  Medications: Prior to Admission medications   Medication Sig Start Date End Date Taking? Authorizing Provider  albuterol (PROVENTIL) (2.5 MG/3ML) 0.083% nebulizer solution Take 3 mLs (2.5 mg total) by nebulization every 2 (two) hours as needed for shortness of breath. 12/15/21  Yes Emokpae, Courage, MD  Amino Acids-Protein Hydrolys (FEEDING SUPPLEMENT, PRO-STAT 64,) LIQD Take 30 mLs by mouth daily.   Yes [provider]  ascorbic acid (VITAMIN C) 500 MG tablet Take 500 mg by mouth 2 (two) times daily.   Yes [provider]  fentaNYL (DURAGESIC) 25 MCG/HR Place 1 patch onto the skin every 3 (three) days.   Yes [provider]  fluticasone furoate-vilanterol (BREO ELLIPTA) 100-25 MCG/ACT AEPB Inhale 1 puff into the lungs daily. 12/16/21  Yes Shon Hale, MD  hydrOXYzine (ATARAX) 25 MG tablet Take 25 mg by mouth every 6 (six) hours as needed for itching.   Yes [provider]  ketoconazole (NIZORAL) 2 % shampoo Apply 1 application  topically See admin instructions. Apply to scalp (shampoo) topically during day shift every Tuesday and Friday for tinea versicolor (bath days)   Yes [provider]  lactulose (CHRONULAC) 10 GM/15ML solution Take 15 mLs by mouth daily.   Yes [provider]  levothyroxine (SYNTHROID) 175 MCG tablet Take 175 mcg by mouth daily before breakfast.   Yes [provider]  Menthol-Zinc Oxide (CALMOSEPTINE) 0.44-20.6 % OINT Apply 1 application  topically 3 (three) times daily.   Yes [provider]  Multiple Vitamin (MULTIVITAMIN) tablet  Take 1 tablet by mouth daily.   Yes [provider]  ondansetron (ZOFRAN-ODT) 4 MG disintegrating tablet Take 4 mg by mouth See admin instructions. Give 4 mg every 4 hours as needed for nausea, vomiting for up to 2 doses.   Yes [provider]  oxyCODONE (ROXICODONE) 15 MG immediate release tablet Take 15 mg by mouth every 4 (four) hours as needed for pain.   Yes [provider]  OXYGEN Inhale 3 L/min into the lungs continuous.   Yes [provider]  polyethylene glycol (MIRALAX / GLYCOLAX) 17 g packet Take 17 g by mouth daily.   Yes [provider]  potassium chloride SA (KLOR-CON) 20 MEQ tablet Take 20 mEq by mouth daily.   Yes [provider]  pramipexole (MIRAPEX) 1 MG tablet Take 1 tablet (1 mg total) by mouth at bedtime. 10/28/22  Yes Sherryll Burger, Pratik D, DO  senna (SENOKOT) 8.6 MG TABS tablet Take 17.2 mg by mouth 2 (two) times daily.   Yes [provider]  spironolactone (ALDACTONE) 25 MG tablet Take 25 mg by mouth daily.   Yes [provider]  spironolactone (ALDACTONE) 50 MG tablet Take 1 tablet (50 mg total) by mouth daily. 10/06/22 01/29/23 Yes Shah, Pratik D, DO  tamsulosin (FLOMAX) 0.4 MG CAPS capsule Take 1 capsule (0.4 mg total) by mouth daily after supper. Patient taking differently: Take 0.4 mg by mouth at bedtime. 09/06/22  Yes Donnita Falls, FNP  Torsemide 40 MG TABS Take 40 mg by mouth See admin instructions. Take 60 mg every morning and 40 mg every evening Patient taking differently: Take 40 mg  by mouth See admin instructions. 40 mg once daily at 1400. 10/06/22  Yes Shah, Pratik D, DO  Torsemide 60 MG TABS Take 60 mg by mouth daily before breakfast.   Yes [provider]  zinc sulfate 220 (50 Zn) MG capsule Take 220 mg by mouth daily.   Yes [provider]     Vital Signs: BP (!) 79/51   Pulse 84   Temp (!) 97.4 F (36.3 C) (Oral)   Resp 14   Ht 5\' 9"  (1.753 m)   Wt 236 lb 1.8 oz (107.1  kg)   SpO2 (!) 83%   BMI 34.87 kg/m   Physical Examawake/alert; GB drain intact, insertion site ok, mildly tender, OP 350 cc dark bile in bag; drain flushed without difficulty  Imaging: IR Perc Cholecystostomy  Result Date: 12/04/2022 INDICATION: 82 year old male with severe acute calculus cholecystitis. He is not an operative candidate and presents for percutaneous cholecystostomy tube placement. EXAM: CHOLECYSTOSTOMY MEDICATIONS: In patient currently receiving intravenous antibiotics. No additional antibiotic prophylaxis was administered. ANESTHESIA/SEDATION: Due to hypotension and existing fentanyl patch, only 1 mg Versed was administered for anxiolysis. This was administered by radiology nursing under my supervision. FLUOROSCOPY TIME:  Radiation exposure index: 17 mGy reference air kerma COMPLICATIONS: None immediate. PROCEDURE: Informed written consent was obtained from the patient after a thorough discussion of the procedural risks, benefits and alternatives. All questions were addressed. Maximal Sterile Barrier Technique was utilized including caps, mask, sterile gowns, sterile gloves, sterile drape, hand hygiene and skin antiseptic. A timeout was performed prior to the initiation of the procedure. The right upper quadrant was interrogated with ultrasound. The gallbladder is identified. A suitable skin entry site was selected and marked. Local anesthesia was attained by infiltration with 1% lidocaine. A small dermatotomy was made. Under real-time ultrasound guidance, a 21 gauge Accustick needle was advanced along a short transhepatic course and into the gallbladder lumen. A 0.018 wire was then coiled in the gallbladder lumen. The Accustick needle was removed. The transitional dilator was advanced over the wire and into the gallbladder lumen. A an aspirate of cloudy black bile was obtained and sent for Gram stain and culture. A gentle hand injection of contrast material opacifies the gallbladder  lumen. A 0.035 Amplatz wire was then coiled in the gallbladder lumen. The transitional dilator was removed. The percutaneous tract was dilated to 10 [place]. A 10 French all-purpose drainage catheter was then advanced over the wire and into the gallbladder. Images were obtained and stored for the medical record. The catheter was flushed and connected to JP bulb suction. The catheter was then secured to the skin with 0 Prolene suture. IMPRESSION: Successful placement of a 10 French transhepatic percutaneous cholecystostomy tube for the indication of acute calculus cholecystitis and a poor operative candidate. Electronically Signed   By: Malachy Moan M.D.   On: 12/04/2022 17:26   DG CHEST PORT 1 VIEW  Result Date: 12/04/2022 CLINICAL DATA:  Central line placement EXAM: PORTABLE CHEST 1 VIEW COMPARISON:  12/03/2022 FINDINGS: Single frontal view of the chest demonstrates left internal jugular catheter tip overlying superior vena cava. Cardiac silhouette is enlarged. Lung volumes are diminished, with crowding the central vasculature. Stable small left effusion and left basilar atelectasis. No pneumothorax. IMPRESSION: 1. No complication after left internal jugular catheter placement. 2. Low lung volumes, with left basilar atelectasis and small left effusion unchanged. Electronically Signed   By: Sharlet Salina M.D.   On: 12/04/2022 14:53   CT ABDOMEN PELVIS W  CONTRAST  Result Date: 12/03/2022 CLINICAL DATA:  Vomiting.  Nausea EXAM: CT ABDOMEN AND PELVIS WITH CONTRAST TECHNIQUE: Multidetector CT imaging of the abdomen and pelvis was performed using the standard protocol following bolus administration of intravenous contrast. RADIATION DOSE REDUCTION: This exam was performed according to the departmental dose-optimization program which includes automated exposure control, adjustment of the mA and/or kV according to patient size and/or use of iterative reconstruction technique. CONTRAST:  OMNIPAQUE IOHEXOL  300 MG/ML  SOLN COMPARISON:  None FINDINGS: Lower chest: Heart is enlarged. There are significant coronary artery calcifications. There also calcifications along the aortic valve. Basilar atelectasis. Tiny effusions. Left-greater-than-right. Hepatobiliary: Markedly enlarged gallbladder with wall thickening, edema. There also areas of poor enhancement along the gallbladder wall. There is also air towards the fundus in the wall itself. Stones are seen dependently as well as a stone in what appears to be the cystic duct on coronal series 5, image 65. Findings are worrisome for developing emphysematous cholecystitis. There are some adjacent reactive nodes identified. Significant inflammatory stranding and fluid. Periportal edema. No space-occupying separate liver lesion.  Patent portal vein. Pancreas: Unremarkable. No pancreatic ductal dilatation or surrounding inflammatory changes. Spleen: Normal in size without focal abnormality. Adrenals/Urinary Tract: Adrenal glands are preserved. No collecting system dilatation. There is a lower pole slightly exophytic lesion on the right measuring 2.8 cm. Hounsfield unit on portal venous phase of 47. On delayed 45. Indeterminate lesion. Please correlate with any prior or dedicated workup when appropriate. A postcontrast study after clearance of the current contrast may be useful to assess for density without contrast. Also a tiny low-attenuation lesion along the anterior aspect of the left mid kidney as well which is too small to completely characterize. Attention on follow up examination as well. The ureters have normal course and caliber extending down to the bladder. Foley catheter in the underdistended urinary bladder. Stomach/Bowel: No oral contrast. The stomach is relatively collapsed. Third portion duodenal diverticula. Small bowel is nondilated. Terminal ileal small bowel stool appearance, nonspecific. The large bowel has moderate stool diffusely is nondilated. There is  some redundant course of the sigmoid colon with mild wall thickening along the rectosigmoid colon, nonspecific. A subtle colitis is not excluded. Vascular/Lymphatic: Normal caliber aorta and IVC with scattered vascular calcifications along the aorta and branch vessels. Few prominent upper abdominal nodes. No clear pathologically enlarged lymph nodes identified. Reproductive: Prominent prostate. Other: Mesenteric stranding identified. Once again there is fluid in the right upper quadrant surrounding the liver and extending along the right pericolic gutter. Musculoskeletal: Curvature and degenerative changes along the spine. Degenerative changes of the pelvis. Sclerotic focus along the right pubic bone on series 2, image 89. Possibly a bone island but there is a differential. Critical Value/emergent results were called by telephone at the time of interpretation on 12/03/2022 at 11:58 am to provider Pricilla Loveless , who verbally acknowledged these results. IMPRESSION: Dilated gallbladder with severe inflammatory changes, wall thickening and fluid. There are areas of poor enhancement along the wall with the air in the wall towards the fundus. Numerous stones including what may be a stone in the cystic duct. Findings are consistent with cholecystitis and with air and poor enhancement of the wall, emphysematous/gangrenous gallbladder. Scattered colonic stool. There is some wall thickening along underdistended sigmoid colon. This could be passive although a subtle colitis is not excluded. Please correlate with any symptoms. Indeterminate right-sided renal lesion. Recommend comparison to prior or follow up study after clearance of the contrast to  assess for precontrast density. Lung base opacities with trace pleural fluid, left-greater-than-right. Recommend follow-up. Significant calcifications along the area of the aortic valve. Electronically Signed   By: Karen Kays M.D.   On: 12/03/2022 15:00   DG Chest Portable 1  View  Result Date: 12/03/2022 CLINICAL DATA:  Vomiting EXAM: PORTABLE CHEST 1 VIEW COMPARISON:  X-ray 10/23/2022 FINDINGS: Underinflation. Small left effusion with some adjacent opacity. No pneumothorax. Enlarged cardiopericardial silhouette with vascular congestion. Dense left upper lobe lung nodule consistent with old granulomatous disease. Film is under penetrated. IMPRESSION: Underinflation.  Small left effusion with some adjacent opacity. Enlarged heart with some vascular congestion. Electronically Signed   By: Karen Kays M.D.   On: 12/03/2022 13:11    Labs:  CBC: Recent Labs    12/03/22 1122 12/04/22 0936 12/04/22 1357 12/05/22 0842  WBC 20.4* 41.8* 34.5* 35.6*  HGB 15.5 15.0 15.2 12.2*  HCT 50.1 48.8 50.6 40.8  PLT 218 210 192 180    COAGS: Recent Labs    12/03/22 1737  INR 1.3*    BMP: Recent Labs    12/03/22 1122 12/04/22 0936 12/04/22 1357 12/05/22 0647  NA 133* 138 140 132*  K 3.2* 3.8 3.5 3.9  CL 91* 96* 94* 93*  CO2 33* 31 28 26   GLUCOSE 153* 140* 86 180*  BUN 32* 33* 36* 46*  CALCIUM 8.4* 8.4* 8.5* 7.9*  CREATININE 1.12 1.43* 1.73* 2.22*  GFRNONAA >60 48* 38* 29*    LIVER FUNCTION TESTS: Recent Labs    12/03/22 1122 12/04/22 0936 12/04/22 1357 12/05/22 0647  BILITOT 3.2* 3.6* 4.2* 5.3*  AST 25 25 26 30   ALT 20 18 17 16   ALKPHOS 151* 143* 209* 155*  PROT 7.0 6.4* 6.3* 6.4*  ALBUMIN 2.4* 1.8* 1.7* 2.5*    Assessment and Plan: Pt with hx acute gangrenous cholecystitis/septic shock, poor surgical candidate; s/p perc GB drain yesterday; remains hypotensive; afebrile; WBC 35.6(34.9), hgb 12.2(15.2), creat 2.2(1.73), bile cx pend; GB drain will need to remain in place for at least 4-6 weeks and likely indefinitely; will need f/u cholangiogram/drain exchange in 4-6 weeks; cont drain irrigation, close OP monitoring; obtain f/u CT and/or drain injection with any concerns regarding drain function in interim   Electronically Signed: D. Jeananne Rama,  PA-C 12/05/2022, 12:02 PM   I spent a total of 15 Minutes at the the patient's bedside AND on the patient's hospital floor or unit, greater than 50% of which was counseling/coordinating care for gallbladder drain    Patient ID: Vernon Campbell, male   DOB: 02/21/1941, 82 y.o.   MRN: [id number]

## 2022-12-05 NOTE — Progress Notes (Signed)
NAME:  Vernon Campbell, MRN:  [id number], DOB:  01/02/1941, LOS: 2 ADMISSION DATE:  12/03/2022, CONSULTATION DATE: 12/04/2022 REFERRING MD: Pamella Pert MD, CHIEF COMPLAINT: 12/04/2022  History of Present Illness:   82 year old with history of CHF, COPD, pulmonary hypertension with cor pulmonale, paroxysmal atrial fibrillation presenting with gangrenous cholecystitis.  He is not a surgical candidate and had percutaneous cholecystostomy done by IR on 9/14.  Postprocedure he is hypotensive and PCCM consulted for management.  Wants water, otherwise feels okay, no dizziness, chest pain or SOB.  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   Significant Hospital Events: Including procedures, antibiotic start and stop dates in addition to other pertinent events   9/13 admit 9/14 per chole tube, ICU for pressors  Interim History / Subjective:  No events, feels better,  pressor needs a little better.  Objective   Blood pressure (!) 85/64, pulse 88, temperature 97.6 F (36.4 C), temperature source Oral, resp. rate (!) 24, height 5\' 9"  (1.753 m), weight 107.1 kg, SpO2 92%. 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 and aortic stenosis with recurrent issues with fluid retention AKI on CKD3a- suspect septic ATN at this point Chronic urinary retention- foley has been in place a while Question of subtle colitis but no symptoms DNR Hx Afib not on AC, occurred a few years ago in context of volume overloaded state, no recurrence noted Hypokalemia- being repleted  - LR @ 75 x 24h - Levophed, vaso, stress steroids for MAP 65 - Zosyn - Replace foley and check urine culture - BIPAP at bedtime - Will need diuresis once through vasoplegic stage - Monitor chole drain output; OP IR f/u - DNR but okay with aggressive care otherwise; if deteriorates should probably be evaluated for hospice  Best Practice (right click and "Reselect all SmartList Selections" daily)   Diet/type: cardiac DVT prophylaxis: LMWH GI prophylaxis: N/A Lines: Central line Foley:  yes chronic Code Status:  DNR Last date of multidisciplinary goals of care discussion [son/patient updated at bedside]  33 min cc time

## 2022-12-05 NOTE — Consult Note (Signed)
WOC Nurse Consult Note: Reason for Consult: Full thickness tissue loss to sacrococcygeal area, present on admission.  Wound type: pressure injury Pressure Injury POA: Yes Measurement: see flowsheets Wound bed: red and moist Drainage (amount, consistency, odor) minimal serosanguinous  no odor Periwound: intact Dressing procedure/placement/frequency: Cleanse wounds to coccyx with VASHE and pat dry. Apply VASHE moist gauze to wound bed and cover with silicone foam.  Change daily.  Will not follow at this time.  Please re-consult if needed.  Mike Gip MSN, RN, FNP-BC CWON Wound, Ostomy, Continence Nurse Outpatient [facility] [phone] Pager [phone]

## 2022-12-06 DIAGNOSIS — K810 Acute cholecystitis: Secondary | ICD-10-CM | POA: Diagnosis not present

## 2022-12-06 LAB — CBC
HCT: 40.4 % (ref 39.0–52.0)
Hemoglobin: 12.3 g/dL — ABNORMAL LOW (ref 13.0–17.0)
MCH: 31.1 pg (ref 26.0–34.0)
MCHC: 30.4 g/dL (ref 30.0–36.0)
MCV: 102 fL — ABNORMAL HIGH (ref 80.0–100.0)
Platelets: 179 10*3/uL (ref 150–400)
RBC: 3.96 MIL/uL — ABNORMAL LOW (ref 4.22–5.81)
RDW: 17.4 % — ABNORMAL HIGH (ref 11.5–15.5)
WBC: 22.6 10*3/uL — ABNORMAL HIGH (ref 4.0–10.5)
nRBC: 0 % (ref 0.0–0.2)

## 2022-12-06 LAB — BASIC METABOLIC PANEL
Anion gap: 12 (ref 5–15)
BUN: 60 mg/dL — ABNORMAL HIGH (ref 8–23)
CO2: 27 mmol/L (ref 22–32)
Calcium: 7.8 mg/dL — ABNORMAL LOW (ref 8.9–10.3)
Chloride: 93 mmol/L — ABNORMAL LOW (ref 98–111)
Creatinine, Ser: 2.57 mg/dL — ABNORMAL HIGH (ref 0.61–1.24)
GFR, Estimated: 24 mL/min — ABNORMAL LOW (ref >60)
Glucose, Bld: 198 mg/dL — ABNORMAL HIGH (ref 70–99)
Potassium: 3.6 mmol/L (ref 3.5–5.1)
Sodium: 132 mmol/L — ABNORMAL LOW (ref 135–145)

## 2022-12-06 LAB — MAGNESIUM: Magnesium: 2.1 mg/dL (ref 1.7–2.4)

## 2022-12-06 MED ORDER — HYDROCORTISONE SOD SUC (PF) 100 MG IJ SOLR
50.0000 mg | Freq: Every day | INTRAMUSCULAR | Status: DC
  Administered 2022-12-07: 50 mg via INTRAVENOUS
  Filled 2022-12-06: qty 2
  Filled 2022-12-06: qty 1

## 2022-12-06 MED ORDER — POTASSIUM CHLORIDE CRYS ER 20 MEQ PO TBCR
40.0000 meq | EXTENDED_RELEASE_TABLET | Freq: Once | ORAL | Status: AC
  Administered 2022-12-06: 40 meq via ORAL
  Filled 2022-12-06: qty 2

## 2022-12-06 MED ORDER — GERHARDT'S BUTT CREAM: TOPICAL_CREAM | CUTANEOUS | Status: DC | PRN

## 2022-12-06 NOTE — Plan of Care (Signed)

## 2022-12-06 NOTE — TOC Initial Note (Signed)
Transition of Care [facility]) - Initial/Assessment Note    Patient Details  Name: Vernon Campbell MRN: [id number] Date of Birth: [birth year]  [facility]) CM/SW Contact:    Gala Lewandowsky, RN Phone Number: 12/06/2022, 11:58 AM  Clinical Narrative: Risk for readmission assessment completed. Patient was discussed in morning progression rounds. Case Manager confirmed with CSW that the patient is [facility]. TOC will continue to follow for additional transition of care needs.                Expected Discharge Plan: Skilled Nursing Facility Barriers to Discharge: Continued Medical Work up   Patient Goals and CMS Choice Patient states their goals for this hospitalization and ongoing recovery are::  (Plan for SNF Long term with Hospice)  Expected Discharge Plan and Services In-house Referral: Clinical Social Work Discharge Planning Services: CM Consult Post Acute Care Choice: Skilled Nursing Facility Living arrangements for the past 2 months: Skilled Nursing Facility  Prior Living Arrangements/Services Living arrangements for the past 2 months: Skilled Nursing Facility Lives with:: Facility Resident Patient language and need for interpreter reviewed:: Yes Do you feel safe going back to the place where you live?: Yes      Need for Family Participation in Patient Care: Yes (Comment) Care giver support system in place?: Yes (comment)   Criminal Activity/Legal Involvement Pertinent to Current Situation/Hospitalization: No - Comment as needed  Permission Sought/Granted Permission sought to share information with : Case Manager, Family Supports     Emotional Assessment Appearance:: Appears stated age Attitude/Demeanor/Rapport: Unable to Assess Affect (typically observed): Unable to Assess   Alcohol / Substance Use: Not Applicable Psych Involvement: No (comment)  Admission diagnosis:  Acute emphysematous cholecystitis  [K81.0] Cholecystitis [K81.9] Patient Active Problem List   Diagnosis Date Noted   Acute gangrenous cholecystitis 12/03/2022   Cellulitis 10/23/2022   Acute heart failure with preserved ejection fraction (HCC) 09/27/2022   Mod to Severe Aortic stenosis 09/27/2022   Cellulitis of scrotum 09/27/2022   Scrotal edema 09/27/2022   Chronic diastolic CHF (congestive heart failure) (HCC) 07/05/2022   COPD (chronic obstructive pulmonary disease) (HCC) 12/11/2021   Hypokalemia 08/20/2021   Restless leg syndrome 08/18/2021   Chronic respiratory failure with hypoxia (HCC) 08/18/2021   Hyperlipidemia 10/01/2020   Hypothyroidism 10/01/2020   Macrocytic anemia 10/01/2020   Obesity, Class III, BMI 40-49.9 (morbid obesity) (HCC) 10/01/2020   Chronic stasis dermatitis 10/01/2020   Right carpal tunnel syndrome 12/19/2018   Mild pulmonary hypertension (HCC) 04/15/2018   Debility 09/23/2017   Obesity hypoventilation syndrome (HCC) 03/06/2017   Chronic knee pain after total replacement of right knee joint 11/09/2016   Coronary artery disease involving native coronary artery 10/29/2015   MGUS (monoclonal gammopathy of unknown significance) 08/15/2015   Osteoarthritis of right hip 01/19/2013   DJD (degenerative joint disease) of knee 09/18/2012   OSA (obstructive sleep apnea) 01/26/2012   Esophageal reflux 05/27/2009   Essential hypertension 08/08/2008   BPH without urinary obstruction 04/24/2007   PCP:  System, Provider Not In Pharmacy:   [facility] - [place], [place] - [address] [place] [address] Phone: [phone] Fax: [phone]  Social Determinants of Health (SDOH) Social History: SDOH Screenings   Food Insecurity: No Food Insecurity (10/23/2022)  Housing: Low Risk  (10/23/2022)  Transportation Needs: No Transportation Needs (10/23/2022)  Utilities: Not At Risk (10/23/2022)  Social Connections: Unknown (07/31/2021)   Received from [facility]  Tobacco Use: Medium Risk (12/03/2022)   Readmission Risk Interventions    12/06/2022   11:58 AM 10/24/2022    3:34 PM 09/28/2022    2:05 PM  Readmission Risk Prevention Plan  Transportation Screening Complete Complete Complete  PCP or Specialist Appt within 3-5 Days  Complete Complete  HRI or Home Care Consult  Complete Complete  Social Work Consult for Recovery Care Planning/Counseling  Complete Complete  Palliative Care Screening  Complete Not Applicable  Medication Review Oceanographer) Referral to Pharmacy Complete Complete  HRI or Home Care Consult Complete    SW Recovery Care/Counseling Consult Complete    Skilled Nursing Facility Complete

## 2022-12-06 NOTE — Progress Notes (Signed)
NAME:  Vernon Campbell, MRN:  [id number], DOB:  1940/05/19, LOS: 3 ADMISSION DATE:  12/03/2022, CONSULTATION DATE: 12/04/2022 REFERRING MD: Pamella Pert MD, CHIEF COMPLAINT: 12/04/2022  History of Present Illness:   82 year old with history of CHF, COPD, pulmonary hypertension with cor pulmonale, paroxysmal atrial fibrillation presenting with gangrenous cholecystitis.  He is not a surgical candidate and had percutaneous cholecystostomy done by IR on 9/14.  Postprocedure he is hypotensive and PCCM consulted for management.  Wants water, otherwise feels okay, no dizziness, chest pain or SOB.  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   Significant Hospital Events: Including procedures, antibiotic start and stop dates in addition to other pertinent events   9/13 admit 9/14 per chole tube, ICU for pressors 9/16 remains on low-dose pressors.  Patient states that he  feels better.  Interim History / Subjective:   Remains on low-dose pressors.  Tolerated percutaneous drain.  Titrating off of Levophed today.  Objective   Blood pressure 114/73, pulse 75, temperature 97.9 F (36.6 C), resp. rate (!) 23, height 5\' 9"  (1.753 m), weight 107.1 kg, SpO2 93%. CVP:  [9 mmHg-14 mmHg] 9 mmHg      Intake/Output Summary (Last 24 hours) at 12/06/2022 0922 Last data filed at 12/06/2022 0700 Gross per 24 hour  Intake 1599.29 ml  Output 745 ml  Net 854.29 ml   Filed Weights   12/03/22 1051 12/04/22 0420  Weight: 127.5 kg 107.1 kg    Examination: General: Elderly gentleman, sitting up in bed no distress. HEENT: NCAT tracking, Abdomen mildly distended, little bit of tenderness in the right upper quadrant, drain in place with brown drainage Heart: Regular rhythm S1-S2, systolic murmur Lungs Clear to auscultation no crackles Extremities: No significant edema.  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 Wean hydrocortisone to 50 mg daily x 3 days then stop PT OT, up in chair PERC Chole drain remains in place, will need outpatient IR follow-up. Remains DNR, if he goes home with drain in place agree with outpatient palliative care management and consideration for hospice care at some point in time.  But at this time prefers ongoing aggressive care  measures. Consult placed  to palliative   Best Practice (right click and "Reselect all SmartList Selections" daily)   Diet/type: cardiac DVT prophylaxis: LMWH GI prophylaxis: N/A Lines: Central line Foley:  yes chronic Code Status:  DNR Last date of multidisciplinary goals of care discussion [son/patient updated at bedside]  This patient is critically ill with multiple organ system failure; which, requires frequent high complexity decision making, assessment, support, evaluation, and titration of therapies. This was completed through the application of advanced monitoring technologies and extensive interpretation of multiple databases. During this encounter critical care time was devoted to patient care services described in this note for 32 minutes.  Josephine Igo, DO [facility] 12/06/2022 9:22 AM

## 2022-12-06 NOTE — Progress Notes (Signed)
Pharmacy Antibiotic Note  Vernon Campbell is a 82 y.o. male admitted on 12/03/2022 with  intra-abdominal infection .  Pharmacy has been consulted for zosyn dosing.  Plan: Zosyn 3.375g IV q8h (4 hour infusion). F/u cxs and clinical progress Monitor V/S, labs  Height: 5\' 9"  (175.3 cm) Weight: 107.1 kg (236 lb 1.8 oz) IBW/kg (Calculated) : 70.7  Temp (24hrs), Avg:98.6 F (37 C), Min:97.9 F (36.6 C), Max:99 F (37.2 C)  Recent Labs  Lab 12/03/22 1355 12/04/22 0327 12/04/22 0936 12/04/22 1357 12/04/22 1525 12/05/22 0647 12/05/22 0842 12/05/22 1812 12/06/22 0350  WBC  --  35.2* 41.8* 34.5*  --   --  35.6*  --  22.6*  CREATININE  --  1.34* 1.43* 1.73*  --  2.22*  --  2.60* 2.57*  LATICACIDVEN 1.4  --   --  3.9* 2.7*  --   --   --   --     Estimated Creatinine Clearance: 25.8 mL/min (A) (by C-G formula based on SCr of 2.57 mg/dL (H)).    No Known Allergies  Antimicrobials this admission: Zosyn 9/13 >>   Microbiology results: No cxs  Thank you for allowing pharmacy to be a part of this patient's care.  Reece Leader, Colon Flattery, BCCP Clinical Pharmacist  12/06/2022 2:31 PM   [facility] pharmacy phone numbers are listed on [email or web address]

## 2022-12-07 DIAGNOSIS — K810 Acute cholecystitis: Secondary | ICD-10-CM | POA: Diagnosis not present

## 2022-12-07 DIAGNOSIS — Z515 Encounter for palliative care: Secondary | ICD-10-CM

## 2022-12-07 DIAGNOSIS — I5032 Chronic diastolic (congestive) heart failure: Secondary | ICD-10-CM

## 2022-12-07 DIAGNOSIS — J9611 Chronic respiratory failure with hypoxia: Secondary | ICD-10-CM

## 2022-12-07 LAB — BASIC METABOLIC PANEL
Anion gap: 13 (ref 5–15)
BUN: 61 mg/dL — ABNORMAL HIGH (ref 8–23)
CO2: 28 mmol/L (ref 22–32)
Calcium: 7.9 mg/dL — ABNORMAL LOW (ref 8.9–10.3)
Chloride: 95 mmol/L — ABNORMAL LOW (ref 98–111)
Creatinine, Ser: 2.44 mg/dL — ABNORMAL HIGH (ref 0.61–1.24)
GFR, Estimated: 25 mL/min — ABNORMAL LOW (ref >60)
Glucose, Bld: 98 mg/dL (ref 70–99)
Potassium: 3.2 mmol/L — ABNORMAL LOW (ref 3.5–5.1)
Sodium: 136 mmol/L (ref 135–145)

## 2022-12-07 LAB — CBC
HCT: 39.4 % (ref 39.0–52.0)
Hemoglobin: 11.8 g/dL — ABNORMAL LOW (ref 13.0–17.0)
MCH: 30.1 pg (ref 26.0–34.0)
MCHC: 29.9 g/dL — ABNORMAL LOW (ref 30.0–36.0)
MCV: 100.5 fL — ABNORMAL HIGH (ref 80.0–100.0)
Platelets: 175 10*3/uL (ref 150–400)
RBC: 3.92 MIL/uL — ABNORMAL LOW (ref 4.22–5.81)
RDW: 17.3 % — ABNORMAL HIGH (ref 11.5–15.5)
WBC: 15.7 10*3/uL — ABNORMAL HIGH (ref 4.0–10.5)
nRBC: 0 % (ref 0.0–0.2)

## 2022-12-07 LAB — URINE CULTURE: Culture: 30000 — AB

## 2022-12-07 LAB — MAGNESIUM: Magnesium: 2.2 mg/dL (ref 1.7–2.4)

## 2022-12-07 MED ORDER — POTASSIUM CHLORIDE CRYS ER 20 MEQ PO TBCR
40.0000 meq | EXTENDED_RELEASE_TABLET | Freq: Four times a day (QID) | ORAL | Status: AC
  Administered 2022-12-07 (×2): 40 meq via ORAL
  Filled 2022-12-07 (×2): qty 2

## 2022-12-07 NOTE — Progress Notes (Signed)
OT Cancellation Note  Patient Details Name: Vernon Campbell MRN: [id number] DOB: 04-30-1940   Cancelled Treatment:    Reason Eval/Treat Not Completed: OT screened, no needs identified, will sign off.  Patient at SNF and at baseline is followed by Hospice.  Patient is bedbound, has the ability to feed himself, and is planned for d/c back to hospice.  No rehab needs.  Savi Lastinger D Edynn Gillock 12/07/2022, 1:01 PM 12/07/2022  RP, OTR/L  Acute Rehabilitation Services  Office:  [phone]

## 2022-12-07 NOTE — Progress Notes (Signed)
NAME:  Vernon Campbell, MRN:  [id number], DOB:  [birth year], LOS: 4 ADMISSION DATE:  12/03/2022, CONSULTATION DATE: 12/04/2022 REFERRING MD: Pamella Pert MD, CHIEF COMPLAINT: 12/04/2022  History of Present Illness:   82 year old with history of CHF, COPD, pulmonary hypertension with cor pulmonale, paroxysmal atrial fibrillation presenting with gangrenous cholecystitis.  He is not a surgical candidate and had percutaneous cholecystostomy done by IR on 9/14.  Postprocedure he is hypotensive and PCCM consulted for management.  Wants water, otherwise feels okay, no dizziness, chest pain or SOB.  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   Significant Hospital Events: Including procedures, antibiotic start and stop dates in addition to other pertinent events   9/13 admit 9/14 per chole tube, ICU for pressors 9/16 remains on low-dose pressors.  Patient states that he  feels better.  Interim History / Subjective:   Off pressors. Tolerating diet.   Objective   Blood pressure (!) 90/59, pulse 86, temperature 97.8 F (36.6 C), temperature source Oral, resp. rate (!) 22, height 5\' 9"  (1.753 m), weight 107.1 kg, SpO2 96%.        Intake/Output Summary (Last 24 hours) at 12/07/2022 0949 Last data filed at 12/07/2022 0800 Gross per 24 hour  Intake 762.09 ml  Output 1475 ml  Net -712.91 ml   Filed Weights   12/03/22 1051 12/04/22 0420  Weight: 127.5 kg 107.1 kg    Examination: General: elderly gentleman resting in bed no distress. HEENT: NCAT tracking appropriately Abdomen mildly distended abdomen, tenderness to the right upper quadrant and area of the drain insertion.  Brown drainage from the percutaneous chole. Heart: Regular rate rhythm, S1-S2 Lungs clear to auscultation, no crackles Extremities: No significant edema  Serum creatinine improving.  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 Continue PT OT up in chair as tolerated Outpatient management of IR drain.  We will leave his percutaneous cholecystostomy in place. I have consulted palliative care. Currently DNR. Stable for transfer from the intensive care unit.   Best Practice (right click and "Reselect all SmartList Selections" daily)   Diet/type: cardiac DVT prophylaxis: LMWH GI prophylaxis: N/A Lines: Central line Foley:   yes chronic Code Status:  DNR Last date of multidisciplinary goals of care  discussion [son/patient updated at bedside]  Josephine Igo, DO [facility] 12/07/2022 9:49 AM

## 2022-12-07 NOTE — Consult Note (Signed)
Consultation Note Date: 12/07/2022   Patient Name: Vernon Campbell  DOB: [birth year]  MRN: [id number]  Age / Sex: 82 y.o., male  PCP: System, Provider Not In Referring Physician: Josephine Igo, DO  Reason for Consultation: Establishing goals of care  HPI/Patient Profile: 82 y.o. male  admitted on 12/03/2022 with  gangrenous cholecystitis. He is not a surgical candidate and had percutaneous cholecystostomy done by IR on 9/14.   PMH significant for CHF, COPD, pulmonary hypertension with cor pulmonale, atrial fibs, acute on chronic respiratory failure, on home oxygen 3 L obesity, hypertension, coronary artery disease,  According to transition of care patient is a long-term resident at [facility] under [facility].  Patient faces ongoing treatment option decisions, advanced directive decisions and anticipatory care needs.     Clinical Assessment and Goals of Care:  This NP Lorinda Creed reviewed medical records, received report from team, assessed the patient and then meet at the patient's bedside  to discuss diagnosis, prognosis, GOC, EOL wishes disposition and options.   Concept of Palliative Care was introduced as specialized medical care for people and their families living with serious illness.  If focuses on providing relief from the symptoms and stress of a serious illness.  The goal is to improve quality of life for both the patient and the family. Values and goals of care important to patient and family were attempted to be elicited.  Created space and opportunity for patient to explore thoughts and feelings regarding current medical situation.  He clearly verbalizes reason for this hospitalization, he hopes to return to his previous living situation at [facility] as soon as medically stable.  Patient does not think he is under hospice services although transition of care team has confirmed  that, [facility].  Mr. Interrante has a very calm demeanor.  He is "not worried about anything." Mr Obenshain tells me he is ready to go home meaning his heavenly home.   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  He reminisces about the many times he went fishing with his young son     A  discussion was had today regarding advanced directives.  Concepts specific to code status, artifical feeding and hydration, continued IV antibiotics and rehospitalization was had.    The difference between a aggressive medical intervention path  and a palliative comfort care path for this patient at this time was had.   Education offered on hospice benefit; philosophy and eligibility     Natural trajectory and expectations at EOL were discussed.  Questions and concerns addressed.  Patient  encouraged to call with questions or concerns.     PMT will continue to support holistically.             NEXT OF KIN    SUMMARY OF RECOMMENDATIONS    Code Status/Advance Care Planning: DNR  Palliative Prophylaxis:  Aspiration, Bowel Regimen, Delirium Protocol, and Frequent Pain Assessment  Additional Recommendations (Limitations, Scope, Preferences): Avoid Hospitalization  Psycho-social/Spiritual:  Desire for further Chaplaincy support:yes-I placed referral.  Mr. Trettin "likes to speak to other ministers"  Additional Recommendations: Education on Hospice  Prognosis:  < 6 months  Discharge Planning: Skilled Nursing Facility with Hospice      Primary Diagnoses: Present on Admission:  Acute gangrenous cholecystitis  Chronic respiratory failure with hypoxia (HCC)  Hyperlipidemia  Hypothyroidism  Obesity, Class III, BMI 40-49.9 (morbid obesity) (HCC)  Essential hypertension  Cellulitis  Hypokalemia   I have reviewed the medical record, interviewed the patient and family, and examined the patient. The following aspects are pertinent.  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)    Hypokalemia    Hypothyroidism, adult    Ileus (HCC)    Leg pain, diffuse    Lymphedema    Muscle weakness (generalized)    Obstructive sleep apnea syndrome    Osteoarthritis, unspecified osteoarthritis type, unspecified site    Other seborrheic keratosis    Paroxysmal atrial fibrillation (HCC)    Peripheral venous insufficiency    Pulmonary hypertension (HCC)    Recurrent major depression (HCC)    Restless leg syndrome    Vitamin D deficiency, unspecified    Vitreous degeneration of left eye    Social History   Socioeconomic History   Marital status: Single    Spouse name: Not on file   Number of children: Not on file   Years of education: Not on file   Highest education level: Not on file  Occupational History   Not on file  Tobacco Use   Smoking status: Former    Types: Cigarettes   Smokeless tobacco: Never  Vaping Use   Vaping status: Never Used  Substance and Sexual Activity   Alcohol use: Not Currently   Drug use: Not Currently   Sexual activity: Not Currently  Other Topics Concern   Not on file  Social History Narrative   Not on file   Social Determinants of Health   Financial Resource Strain: Not on file  Food Insecurity: No Food Insecurity (10/23/2022)   Hunger Vital Sign    Worried About Running Out of Food in the Last Year: Never true    Ran Out of Food  in the Last Year: Never true  Transportation Needs: No Transportation Needs (10/23/2022)   PRAPARE - Administrator, Civil Service (Medical): No    Lack of Transportation (Non-Medical): No  Physical Activity: Not on file  Stress: Not on file  Social Connections: Unknown (07/31/2021)   Received from [facility]   Social Network    Social Network: Not on file   History reviewed. No pertinent family history. Scheduled Meds:  atorvastatin  20 mg Oral QPM   Chlorhexidine Gluconate Cloth  6 each Topical Q0600   enoxaparin (LOVENOX) injection  40  mg Subcutaneous Q24H   fluticasone furoate-vilanterol  1 puff Inhalation Daily   hydrocortisone sod succinate (SOLU-CORTEF) inj  50 mg Intravenous Daily   levothyroxine  175 mcg Oral Q0600   midodrine  5 mg Oral TID WC   mupirocin ointment  1 Application Nasal BID   pantoprazole  40 mg Oral Daily   potassium chloride  40 mEq Oral Q6H   pramipexole  1 mg Oral QHS   sodium chloride flush  3 mL Intravenous Q12H   tamsulosin  0.4 mg Oral QPM   Continuous Infusions:  sodium chloride     sodium chloride Stopped (12/04/22 2102)   piperacillin-tazobactam (ZOSYN)  IV 3.375 g (12/07/22 1431)   PRN Meds:.sodium chloride, acetaminophen **OR** acetaminophen, Gerhardt's butt cream, iohexol, morphine injection, ondansetron **OR** ondansetron (ZOFRAN) IV, oxyCODONE, sodium chloride flush Medications Prior to Admission:  Prior to Admission medications   Medication Sig Start Date End Date Taking? Authorizing Provider  albuterol (PROVENTIL) (2.5 MG/3ML) 0.083% nebulizer solution Take 3 mLs (2.5 mg total) by nebulization every 2 (two) hours as needed for shortness of breath. 12/15/21  Yes Emokpae, Courage, MD  Amino Acids-Protein Hydrolys (FEEDING SUPPLEMENT, PRO-STAT 64,) LIQD Take 30 mLs by mouth daily.   Yes [provider]  ascorbic acid (VITAMIN C) 500 MG tablet Take 500 mg by mouth 2 (two) times daily.   Yes [provider]  fentaNYL (DURAGESIC) 25 MCG/HR Place 1 patch onto the skin every 3 (three) days.   Yes [provider]  fluticasone furoate-vilanterol (BREO ELLIPTA) 100-25 MCG/ACT AEPB Inhale 1 puff into the lungs daily. 12/16/21  Yes Shon Hale, MD  hydrOXYzine (ATARAX) 25 MG tablet Take 25 mg by mouth every 6 (six) hours as needed for itching.   Yes [provider]  ketoconazole (NIZORAL) 2 % shampoo Apply 1 application  topically See admin instructions. Apply to scalp (shampoo) topically during day shift every Tuesday and Friday for tinea versicolor (bath days)   Yes [provider]  lactulose (CHRONULAC) 10 GM/15ML solution Take 15 mLs by mouth daily.   Yes [provider]  levothyroxine (SYNTHROID) 175 MCG tablet Take 175 mcg by mouth daily before breakfast.   Yes [provider]  Menthol-Zinc Oxide (CALMOSEPTINE) 0.44-20.6 % OINT Apply 1 application  topically 3 (three) times daily.   Yes [provider]  Multiple Vitamin (MULTIVITAMIN) tablet Take 1 tablet by mouth daily.   Yes [provider]  ondansetron (ZOFRAN-ODT) 4 MG disintegrating tablet Take 4 mg by mouth See admin instructions. Give 4 mg every 4 hours as needed for nausea, vomiting for up to 2 doses.   Yes [provider]  oxyCODONE (ROXICODONE) 15 MG immediate release tablet Take 15 mg by mouth every 4 (four) hours as needed for pain.   Yes [provider]  OXYGEN Inhale 3 L/min into the lungs continuous.   Yes [provider]  polyethylene glycol (MIRALAX / GLYCOLAX) 17 g packet Take 17 g by mouth daily.   Yes [provider]  potassium chloride SA (KLOR-CON) 20 MEQ tablet Take 20 mEq by mouth daily.   Yes [provider]  pramipexole (MIRAPEX) 1 MG tablet Take 1 tablet (1 mg total) by mouth at bedtime. 10/28/22  Yes Sherryll Burger, Pratik D, DO  senna (SENOKOT) 8.6 MG TABS tablet Take 17.2 mg by mouth 2 (two) times daily.   Yes  [provider]  spironolactone (ALDACTONE) 25 MG tablet Take 25 mg by mouth daily.  Yes [provider]  spironolactone (ALDACTONE) 50 MG tablet Take 1 tablet (50 mg total) by mouth daily. 10/06/22 01/29/23 Yes Shah, Pratik D, DO  tamsulosin (FLOMAX) 0.4 MG CAPS capsule Take 1 capsule (0.4 mg total) by mouth daily after supper. Patient taking differently: Take 0.4 mg by mouth at bedtime. 09/06/22  Yes Donnita Falls, FNP  Torsemide 40 MG TABS Take 40 mg by mouth See admin instructions. Take 60 mg every morning and 40 mg every evening Patient taking differently: Take 40 mg by mouth See admin instructions. 40 mg once daily at 1400. 10/06/22  Yes Shah, Pratik D, DO  Torsemide 60 MG TABS Take 60 mg by mouth daily before breakfast.   Yes [provider]  zinc sulfate 220 (50 Zn) MG capsule Take 220 mg by mouth daily.   Yes [provider]   No Known Allergies Review of Systems  Respiratory:  Positive for shortness of breath.   Neurological:  Positive for weakness.    Physical Exam Cardiovascular:     Rate and Rhythm: Normal rate.  Pulmonary:     Effort: Tachypnea present.  Abdominal:     General: Abdomen is protuberant.  Skin:    General: Skin is warm and dry.  Neurological:     Mental Status: He is alert and oriented to person, place, and time.     Vital Signs: BP 118/79 (BP Location: Right Arm)   Pulse 83   Temp 97.7 F (36.5 C) (Oral)   Resp 16   Ht 5\' 9"  (1.753 m)   Wt 107.1 kg   SpO2 96%   BMI 34.87 kg/m  Pain Scale: 0-10   Pain Score: 0-No pain   SpO2: SpO2: 96 % O2 Device:SpO2: 96 % O2 Flow Rate: .O2 Flow Rate (L/min): 2 L/min  IO: Intake/output summary:  Intake/Output Summary (Last 24 hours) at 12/07/2022 1440 Last data filed at 12/07/2022 0800 Gross per 24 hour  Intake 574.35 ml  Output 1475 ml  Net -900.65 ml    LBM: Last BM Date : 12/07/22 Baseline Weight: Weight: 127.5 kg Most recent weight: Weight: 107.1 kg      Palliative Assessment/Data:  40 %     Time  75 minutes   discussed with transition of care team via secure chat  Signed by: Lorinda Creed, NP   Please contact Palliative Medicine Team phone at [phone] for questions and concerns.  For individual provider: See Loretha Stapler

## 2022-12-07 NOTE — Progress Notes (Signed)
Referring Physician(s): Dr Rhodia Albright  Supervising Physician: Roanna Banning  Patient Status:  [facility] - In-pt  Chief Complaint Cholecystitis, weakness 9/14 IR chole drain placed   Subjective:   Patient lethargic, states he feels a little better Drain intact   Allergies: Patient has no known allergies.  Medications: Prior to Admission medications   Medication Sig Start Date End Date Taking? Authorizing Provider  albuterol (PROVENTIL) (2.5 MG/3ML) 0.083% nebulizer solution Take 3 mLs (2.5 mg total) by nebulization every 2 (two) hours as needed for shortness of breath. 12/15/21  Yes Emokpae, Courage, MD  Amino Acids-Protein Hydrolys (FEEDING SUPPLEMENT, PRO-STAT 64,) LIQD Take 30 mLs by mouth daily.   Yes [provider]  ascorbic acid (VITAMIN C) 500 MG tablet Take 500 mg by mouth 2 (two) times daily.   Yes [provider]  fentaNYL (DURAGESIC) 25 MCG/HR Place 1 patch onto the skin every 3 (three) days.   Yes [provider]  fluticasone furoate-vilanterol (BREO ELLIPTA) 100-25 MCG/ACT AEPB Inhale 1 puff into the lungs daily. 12/16/21  Yes Shon Hale, MD  hydrOXYzine (ATARAX) 25 MG tablet Take 25 mg by mouth every 6 (six) hours as needed for itching.   Yes [provider]  ketoconazole (NIZORAL) 2 % shampoo Apply 1 application  topically See admin instructions. Apply to scalp (shampoo) topically during day shift every Tuesday and Friday for tinea versicolor (bath days)   Yes [provider]  lactulose (CHRONULAC) 10 GM/15ML solution Take 15 mLs by mouth daily.   Yes [provider]  levothyroxine (SYNTHROID) 175 MCG tablet Take 175 mcg by mouth daily before breakfast.   Yes [provider]  Menthol-Zinc Oxide (CALMOSEPTINE) 0.44-20.6 % OINT Apply 1 application  topically 3 (three) times daily.   Yes [provider]  Multiple Vitamin (MULTIVITAMIN) tablet Take 1 tablet by mouth daily.   Yes [provider]  ondansetron (ZOFRAN-ODT) 4 MG disintegrating tablet Take 4 mg by mouth See admin instructions. Give 4 mg every 4 hours as needed for nausea, vomiting for up to 2 doses.   Yes [provider]  oxyCODONE (ROXICODONE) 15 MG immediate release tablet Take 15 mg by mouth every 4 (four) hours as needed for pain.   Yes [provider]  OXYGEN Inhale 3 L/min into the lungs continuous.   Yes [provider]  polyethylene glycol (MIRALAX / GLYCOLAX) 17 g packet Take 17 g by mouth daily.   Yes [provider]  potassium chloride SA (KLOR-CON) 20 MEQ tablet Take 20 mEq by mouth daily.   Yes [provider]  pramipexole (MIRAPEX) 1 MG tablet Take 1 tablet (1 mg total) by mouth at bedtime. 10/28/22  Yes Sherryll Burger, Pratik D, DO  senna (SENOKOT) 8.6 MG TABS tablet Take 17.2 mg by mouth 2 (two) times daily.   Yes [provider]  spironolactone (ALDACTONE) 25 MG tablet Take 25 mg by mouth daily.   Yes [provider]  spironolactone (ALDACTONE) 50 MG tablet Take 1 tablet (50 mg total) by mouth daily. 10/06/22 01/29/23 Yes Shah, Pratik D, DO  tamsulosin (FLOMAX) 0.4 MG CAPS capsule Take 1 capsule (0.4 mg total) by mouth daily after supper. Patient taking differently: Take 0.4 mg by mouth at bedtime. 09/06/22  Yes Donnita Falls, FNP  Torsemide 40 MG TABS Take 40 mg by mouth See admin instructions. Take 60 mg every morning and 40 mg every evening Patient taking differently: Take 40 mg by mouth See admin  instructions. 40 mg once daily at 1400. 10/06/22  Yes Shah, Pratik D, DO  Torsemide 60 MG TABS Take 60 mg by mouth daily before breakfast.   Yes [provider]  zinc sulfate 220 (50 Zn) MG capsule Take 220 mg by mouth daily.   Yes [provider]     Vital Signs: BP (!) 93/59   Pulse 86   Temp 97.8 F (36.6 C) (Oral)   Resp (!) 23   Ht 5\' 9"  (1.753 m)   Wt 236 lb 1.8 oz (107.1 kg)   SpO2 90%   BMI 34.87 kg/m   Physical  Exam Skin:    General: Skin is dry.     Comments: Able to flush and aspirate RUQ drain with no resistance Surrounding skin: warm, dry with no signs of infection Color of drainage bilious     Imaging: IR Perc Cholecystostomy  Result Date: 12/04/2022 INDICATION: 82 year old male with severe acute calculus cholecystitis. He is not an operative candidate and presents for percutaneous cholecystostomy tube placement. EXAM: CHOLECYSTOSTOMY MEDICATIONS: In patient currently receiving intravenous antibiotics. No additional antibiotic prophylaxis was administered. ANESTHESIA/SEDATION: Due to hypotension and existing fentanyl patch, only 1 mg Versed was administered for anxiolysis. This was administered by radiology nursing under my supervision. FLUOROSCOPY TIME:  Radiation exposure index: 17 mGy reference air kerma COMPLICATIONS: None immediate. PROCEDURE: Informed written consent was obtained from the patient after a thorough discussion of the procedural risks, benefits and alternatives. All questions were addressed. Maximal Sterile Barrier Technique was utilized including caps, mask, sterile gowns, sterile gloves, sterile drape, hand hygiene and skin antiseptic. A timeout was performed prior to the initiation of the procedure. The right upper quadrant was interrogated with ultrasound. The gallbladder is identified. A suitable skin entry site was selected and marked. Local anesthesia was attained by infiltration with 1% lidocaine. A small dermatotomy was made. Under real-time ultrasound guidance, a 21 gauge Accustick needle was advanced along a short transhepatic course and into the gallbladder lumen. A 0.018 wire was then coiled in the gallbladder lumen. The Accustick needle was removed. The transitional dilator was advanced over the wire and into the gallbladder lumen. A an aspirate of cloudy black bile was obtained and sent for Gram stain and culture. A gentle hand injection of contrast material opacifies the  gallbladder lumen. A 0.035 Amplatz wire was then coiled in the gallbladder lumen. The transitional dilator was removed. The percutaneous tract was dilated to 10 [place]. A 10 French all-purpose drainage catheter was then advanced over the wire and into the gallbladder. Images were obtained and stored for the medical record. The catheter was flushed and connected to JP bulb suction. The catheter was then secured to the skin with 0 Prolene suture. IMPRESSION: Successful placement of a 10 French transhepatic percutaneous cholecystostomy tube for the indication of acute calculus cholecystitis and a poor operative candidate. Electronically Signed   By: Malachy Moan M.D.   On: 12/04/2022 17:26   DG CHEST PORT 1 VIEW  Result Date: 12/04/2022 CLINICAL DATA:  Central line placement EXAM: PORTABLE CHEST 1 VIEW COMPARISON:  12/03/2022 FINDINGS: Single frontal view of the chest demonstrates left internal jugular catheter tip overlying superior vena cava. Cardiac silhouette is enlarged. Lung volumes are diminished, with crowding the central vasculature. Stable small left effusion and left basilar atelectasis. No pneumothorax. IMPRESSION: 1. No complication after left internal jugular catheter placement. 2. Low lung volumes, with left basilar atelectasis and small left effusion unchanged. Electronically Signed   By:  Sharlet Salina M.D.   On: 12/04/2022 14:53   CT ABDOMEN PELVIS W CONTRAST  Result Date: 12/03/2022 CLINICAL DATA:  Vomiting.  Nausea EXAM: CT ABDOMEN AND PELVIS WITH CONTRAST TECHNIQUE: Multidetector CT imaging of the abdomen and pelvis was performed using the standard protocol following bolus administration of intravenous contrast. RADIATION DOSE REDUCTION: This exam was performed according to the departmental dose-optimization program which includes automated exposure control, adjustment of the mA and/or kV according to patient size and/or use of iterative reconstruction technique. CONTRAST:   OMNIPAQUE IOHEXOL 300 MG/ML  SOLN COMPARISON:  None FINDINGS: Lower chest: Heart is enlarged. There are significant coronary artery calcifications. There also calcifications along the aortic valve. Basilar atelectasis. Tiny effusions. Left-greater-than-right. Hepatobiliary: Markedly enlarged gallbladder with wall thickening, edema. There also areas of poor enhancement along the gallbladder wall. There is also air towards the fundus in the wall itself. Stones are seen dependently as well as a stone in what appears to be the cystic duct on coronal series 5, image 65. Findings are worrisome for developing emphysematous cholecystitis. There are some adjacent reactive nodes identified. Significant inflammatory stranding and fluid. Periportal edema. No space-occupying separate liver lesion.  Patent portal vein. Pancreas: Unremarkable. No pancreatic ductal dilatation or surrounding inflammatory changes. Spleen: Normal in size without focal abnormality. Adrenals/Urinary Tract: Adrenal glands are preserved. No collecting system dilatation. There is a lower pole slightly exophytic lesion on the right measuring 2.8 cm. Hounsfield unit on portal venous phase of 47. On delayed 65. Indeterminate lesion. Please correlate with any prior or dedicated workup when appropriate. A postcontrast study after clearance of the current contrast may be useful to assess for density without contrast. Also a tiny low-attenuation lesion along the anterior aspect of the left mid kidney as well which is too small to completely characterize. Attention on follow up examination as well. The ureters have normal course and caliber extending down to the bladder. Foley catheter in the underdistended urinary bladder. Stomach/Bowel: No oral contrast. The stomach is relatively collapsed. Third portion duodenal diverticula. Small bowel is nondilated. Terminal ileal small bowel stool appearance, nonspecific. The large bowel has moderate stool diffusely is  nondilated. There is some redundant course of the sigmoid colon with mild wall thickening along the rectosigmoid colon, nonspecific. A subtle colitis is not excluded. Vascular/Lymphatic: Normal caliber aorta and IVC with scattered vascular calcifications along the aorta and branch vessels. Few prominent upper abdominal nodes. No clear pathologically enlarged lymph nodes identified. Reproductive: Prominent prostate. Other: Mesenteric stranding identified. Once again there is fluid in the right upper quadrant surrounding the liver and extending along the right pericolic gutter. Musculoskeletal: Curvature and degenerative changes along the spine. Degenerative changes of the pelvis. Sclerotic focus along the right pubic bone on series 2, image 89. Possibly a bone island but there is a differential. Critical Value/emergent results were called by telephone at the time of interpretation on 12/03/2022 at 11:58 am to provider Pricilla Loveless , who verbally acknowledged these results. IMPRESSION: Dilated gallbladder with severe inflammatory changes, wall thickening and fluid. There are areas of poor enhancement along the wall with the air in the wall towards the fundus. Numerous stones including what may be a stone in the cystic duct. Findings are consistent with cholecystitis and with air and poor enhancement of the wall, emphysematous/gangrenous gallbladder. Scattered colonic stool. There is some wall thickening along underdistended sigmoid colon. This could be passive although a subtle colitis is not excluded. Please correlate with any symptoms. Indeterminate right-sided renal  lesion. Recommend comparison to prior or follow up study after clearance of the contrast to assess for precontrast density. Lung base opacities with trace pleural fluid, left-greater-than-right. Recommend follow-up. Significant calcifications along the area of the aortic valve. Electronically Signed   By: Karen Kays M.D.   On: 12/03/2022 15:00   DG  Chest Portable 1 View  Result Date: 12/03/2022 CLINICAL DATA:  Vomiting EXAM: PORTABLE CHEST 1 VIEW COMPARISON:  X-ray 10/23/2022 FINDINGS: Underinflation. Small left effusion with some adjacent opacity. No pneumothorax. Enlarged cardiopericardial silhouette with vascular congestion. Dense left upper lobe lung nodule consistent with old granulomatous disease. Film is under penetrated. IMPRESSION: Underinflation.  Small left effusion with some adjacent opacity. Enlarged heart with some vascular congestion. Electronically Signed   By: Karen Kays M.D.   On: 12/03/2022 13:11    Labs:  CBC: Recent Labs    12/04/22 1357 12/05/22 0842 12/06/22 0350 12/07/22 0516  WBC 34.5* 35.6* 22.6* 15.7*  HGB 15.2 12.2* 12.3* 11.8*  HCT 50.6 40.8 40.4 39.4  PLT 192 180 179 175    COAGS: Recent Labs    12/03/22 1737  INR 1.3*    BMP: Recent Labs    12/05/22 0647 12/05/22 1812 12/06/22 0350 12/07/22 0516  NA 132* 135 132* 136  K 3.9 3.5 3.6 3.2*  CL 93* 85* 93* 95*  CO2 26 25 27 28   GLUCOSE 180* 251* 198* 98  BUN 46* 54* 60* 61*  CALCIUM 7.9* 7.6* 7.8* 7.9*  CREATININE 2.22* 2.60* 2.57* 2.44*  GFRNONAA 29* 24* 24* 25*    LIVER FUNCTION TESTS: Recent Labs    12/03/22 1122 12/04/22 0936 12/04/22 1357 12/05/22 0647  BILITOT 3.2* 3.6* 4.2* 5.3*  AST 25 25 26 30   ALT 20 18 17 16   ALKPHOS 151* 143* 209* 155*  PROT 7.0 6.4* 6.3* 6.4*  ALBUMIN 2.4* 1.8* 1.7* 2.5*    Drain Location RUQ  Size: Fr size: 10 Fr Date of placement: 9/14 Currently to: Drain collection device: gravity 24 hour output:  Output by Drain (mL) 12/05/22 0701 - 12/05/22 1900 12/05/22 1901 - 12/06/22 0700 12/06/22 0701 - 12/06/22 1900 12/06/22 1901 - 12/07/22 0700 12/07/22 0701 - 12/07/22 0917  Biliary Tube Cook slip-coat 10.2 Fr. RUQ 50        Interval imaging/drain manipulation:  None  Current examination: Flushes/aspirates easily.  Output is bilious Insertion site unremarkable. Suture and stat lock in  place. Dressed appropriately.   Plan:  Continue Flush every shift with 5-10 cc sterile saline Record output in chart  Discharge planning: Please contact IR APP or on call IR MD prior to patient d/c to ensure appropriate follow up plans are in place.  Patient will hear from IR outpatient clinic for follow-up in 6-8 weeks Continue to flush daily as outpatient IR APP will place outpatient orders  IR will continue to follow - please call with questions or concerns.     Electronically Signed: Robet Leu, PA-C 12/07/2022, 9:17 AM   I spent a total of 15 Minutes at the the patient's bedside AND on the patient's hospital floor or unit, greater than 50% of which was counseling/coordinating care for IR percutaneous cholecystostomy drain

## 2022-12-07 NOTE — Progress Notes (Signed)
   12/07/22 2000  BiPAP/CPAP/SIPAP  Reason BIPAP/CPAP not in use Non-compliant (pt states he hasnt worn one and doesnt want to.)

## 2022-12-07 NOTE — Progress Notes (Signed)
PT Cancellation Note  Patient Details Name: Vernon Campbell MRN: [id number] DOB: [birth year]   Cancelled Treatment:    Reason Eval/Treat Not Completed: PT screened, no needs identified, will sign off. Spoke with Hospice representative who follows him for care. She reports that he is total care and requires hoyer lift to get up OOB and hasn't been out of bed in 6 weeks. Pt with no skilled acute PT needs as pt under hospice care and will be returning to [facility] long term care with continues hospice care.  Vernon Campbell, PT, DPT Acute Rehabilitation Services Secure chat preferred Office #: [phone]    Vernon Campbell 12/07/2022, 11:42 AM

## 2022-12-08 DIAGNOSIS — K810 Acute cholecystitis: Secondary | ICD-10-CM | POA: Diagnosis not present

## 2022-12-08 MED ORDER — IPRATROPIUM-ALBUTEROL 0.5-2.5 (3) MG/3ML IN SOLN: 3.0000 mL | RESPIRATORY_TRACT | Status: DC | PRN

## 2022-12-08 MED ORDER — PHENOL 1.4 % MT LIQD: 1.0000 | OROMUCOSAL | Status: DC | PRN

## 2022-12-08 MED ORDER — METOPROLOL TARTRATE 5 MG/5ML IV SOLN: 5.0000 mg | INTRAVENOUS | Status: DC | PRN

## 2022-12-08 MED ORDER — POTASSIUM CHLORIDE CRYS ER 20 MEQ PO TBCR
40.0000 meq | EXTENDED_RELEASE_TABLET | Freq: Once | ORAL | Status: AC
  Administered 2022-12-08: 40 meq via ORAL
  Filled 2022-12-08: qty 2

## 2022-12-08 MED ORDER — SENNOSIDES-DOCUSATE SODIUM 8.6-50 MG PO TABS: 1.0000 | ORAL_TABLET | Freq: Every evening | ORAL | Status: DC | PRN

## 2022-12-08 MED ORDER — SALINE SPRAY 0.65 % NA SOLN: 1.0000 | NASAL | Status: DC | PRN

## 2022-12-08 MED ORDER — MUSCLE RUB 10-15 % EX CREA: 1.0000 | TOPICAL_CREAM | CUTANEOUS | Status: DC | PRN

## 2022-12-08 MED ORDER — TRAZODONE HCL 50 MG PO TABS
50.0000 mg | ORAL_TABLET | Freq: Every evening | ORAL | Status: DC | PRN
  Administered 2022-12-08: 50 mg via ORAL
  Filled 2022-12-08: qty 1

## 2022-12-08 MED ORDER — LORATADINE 10 MG PO TABS: 10.0000 mg | ORAL_TABLET | Freq: Every day | ORAL | Status: DC | PRN

## 2022-12-08 MED ORDER — HYDRALAZINE HCL 20 MG/ML IJ SOLN: 10.0000 mg | INTRAMUSCULAR | Status: DC | PRN

## 2022-12-08 MED ORDER — SODIUM CHLORIDE 0.9 % IV SOLN: INTRAVENOUS | Status: AC

## 2022-12-08 MED ORDER — HYDROCORTISONE 1 % EX CREA: 1.0000 | TOPICAL_CREAM | Freq: Three times a day (TID) | CUTANEOUS | Status: DC | PRN

## 2022-12-08 MED ORDER — HYDROCORTISONE (PERIANAL) 2.5 % EX CREA: 1.0000 | TOPICAL_CREAM | Freq: Four times a day (QID) | CUTANEOUS | Status: DC | PRN

## 2022-12-08 MED ORDER — POLYVINYL ALCOHOL 1.4 % OP SOLN: 1.0000 [drp] | OPHTHALMIC | Status: DC | PRN

## 2022-12-08 MED ORDER — ALUM & MAG HYDROXIDE-SIMETH 200-200-20 MG/5ML PO SUSP: 30.0000 mL | ORAL | Status: DC | PRN

## 2022-12-08 NOTE — Progress Notes (Signed)
Pt stated he doesn't want to wear CPAP

## 2022-12-08 NOTE — Hospital Course (Addendum)
  Brief Narrative:  82 year old with history of CHF, pulmonary hypertension, COPD, P A-fib admitted for gangrenous cholecystitis was deemed not to be a surgical candidate.  Patient underwent IR continuous cholecystostomy on 9/14.  Postprocedure had to be placed on pressors due to hypotension and admitted to the ICU.  Eventually transferred out on 9/17 as he was off pressors and doing better. AKI slowly is improving and discontinued Hydrocortisone and Midodrine.      Assessment & Plan:  Principal Problem:   Acute gangrenous cholecystitis Active Problems:   Chronic respiratory failure with hypoxia (HCC)   Chronic diastolic CHF (congestive heart failure) (HCC)   Hypokalemia   Essential hypertension   Hypothyroidism   Hyperlipidemia   Obesity, Class III, BMI 40-49.9 (morbid obesity) (HCC)   Cellulitis   Septic shock secondary to gangrenous cholecystitis - Status post percutaneous cholecystostomy by IR on 9/14.  Currently on empiric IV Zosyn.  WBC improving -Will slowly wean off midodrine and Solu-Medrol   Urine cultures growing Pseudomonas bacteriuria - Cultures are sensitive to Zosyn, patient is already on it.   Atrial fibrillation - Not on anticoagulation.  Rate controlled, not on long-term anticoagulation   Acute kidney injury -Creatinine peaked at 2.6. Baseline 1.4. IVF   Hypokalemia -As needed repletion   Pulmonary hypertension with cor pulmonale -Supportive care bronchodilators   COPD with chronic hypoxia 3 L nasal cannula -Bronchodilators   Congestive heart failure with preserved EF Moderate to severe AS - Echo in April 2024 showed EF of 65%.  Aldactone, torsemide on hold   Essential hypertension -Due to low blood pressure, antihypertensives on hold.  Currently on midodrine which will slowly start to wean off   Hypothyroidism -Synthroid   Hyperlipidemia - Statin   Chronic Foley in place, changed on 9/15   DVT prophylaxis: Lovenox Code Status: DNR Family  Communication:   Status is: Inpatient Remains inpatient appropriate because: Multiple ongoing issues, will need PT/OT.  Continue hospital stay

## 2022-12-08 NOTE — Plan of Care (Signed)

## 2022-12-08 NOTE — Progress Notes (Signed)
This chaplain responded to PMT NP-Mary consult for spiritual care. The chaplain reviewed the Pt. chart notes before the visit.   This Pt. is awake  and minimally responds to the chaplain's attempts to reconnect with talking points documented in the Pt. chart. The chaplain confirmed after the visit, with the  Pt. RN-Allie, the Pt. is discouraged and wants to be discharged to [facility]. The Pt. declined the chaplain's bedside attempts to assist the Pt. in contacting family and friends.  The Pt. accepted the chaplain's invitation for prayer and asked the chaplain to request a RN visit.  This chaplain is available for F/U spiritual care as needed.  Chaplain Stephanie Acre [phone]

## 2022-12-08 NOTE — Progress Notes (Addendum)
PROGRESS NOTE    Vernon Campbell  BMW:[id number] DOB: [birth year] DOA: 12/03/2022 PCP: System, Provider Not In   Brief Narrative:  82 year old with history of CHF, pulmonary hypertension, COPD, P A-fib admitted for gangrenous cholecystitis was deemed not to be a surgical candidate.  Patient underwent IR continuous cholecystostomy on 9/14.  Postprocedure had to be placed on pressors due to hypotension and admitted to the ICU.  Eventually transferred out on 9/17 as he was off pressors and doing better.   Assessment & Plan:  Principal Problem:   Acute gangrenous cholecystitis Active Problems:   Chronic respiratory failure with hypoxia (HCC)   Chronic diastolic CHF (congestive heart failure) (HCC)   Hypokalemia   Essential hypertension   Hypothyroidism   Hyperlipidemia   Obesity, Class III, BMI 40-49.9 (morbid obesity) (HCC)   Cellulitis   Septic shock secondary to gangrenous cholecystitis - Status post percutaneous cholecystostomy by IR on 9/14.  Currently on empiric IV Zosyn.  WBC improving -Will slowly wean off midodrine and Solu-Medrol  Urine cultures growing Pseudomonas bacteriuria - Cultures are sensitive to Zosyn, patient is already on it.  Atrial fibrillation - Not on anticoagulation.  Rate controlled, not on long-term anticoagulation  Acute kidney injury -Creatinine peaked at 2.6. Baseline 1.4. IVF  Hypokalemia -As needed repletion  Pulmonary hypertension with cor pulmonale -Supportive care bronchodilators  COPD with chronic hypoxia 3 L nasal cannula -Bronchodilators  Congestive heart failure with preserved EF Moderate to severe AS - Echo in April 2024 showed EF of 65%.  Aldactone, torsemide on hold  Essential hypertension -Due to low blood pressure, antihypertensives on hold.  Currently on midodrine which will slowly start to wean off  Hypothyroidism -Synthroid  Hyperlipidemia - Statin  Chronic Foley in place, changed on 9/15  DVT prophylaxis: Lovenox Code  Status: DNR Family Communication:   Status is: Inpatient Remains inpatient appropriate because: Multiple ongoing issues, will need PT/OT.  Continue hospital stay    Subjective: Patient states he feels okay and wants to leave the hospital  Explained to him that he has multiple ongoing issues he cannot leave yet.   Examination:  General exam: Appears calm and comfortable  Respiratory system: Clear to auscultation. Respiratory effort normal. Cardiovascular system: S1 & S2 heard, RRR. No JVD, murmurs, rubs, gallops or clicks.  Bilateral lower extremity pitting edema Gastrointestinal system: Abdomen is nondistended, soft and nontender. No organomegaly or masses felt. Normal bowel sounds heard. Central nervous system: Alert and oriented. No focal neurological deficits. Extremities: Symmetric 5 x 5 power. Skin: No rashes, lesions or ulcers Psychiatry: Judgement and insight appear normal. Mood & affect appropriate. Right upper quadrant drain in place Foley catheter in place Pressure Injury 12/04/22 Coccyx Mid;Medial Stage 3 -  Full thickness tissue loss. Subcutaneous fat may be visible but bone, tendon or muscle are NOT exposed. 5 x 0.5 (Active)  12/04/22 1345  Location: Coccyx  Location Orientation: Mid;Medial  Staging: Stage 3 -  Full thickness tissue loss. Subcutaneous fat may be visible but bone, tendon or muscle are NOT exposed.  Wound Description (Comments): 5 x 0.5  Present on Admission: Yes     Pressure Injury 12/04/22 Medial;Mid Stage 2 -  Partial thickness loss of dermis presenting as a shallow open injury with a red, pink wound bed without slough. 3cmx1.25cm depth 0.25 (Active)  12/04/22 1345  Location:   Location Orientation: Medial;Mid  Staging: Stage 2 -  Partial thickness loss of dermis presenting as a shallow open injury with a red, pink wound  bed without slough.  Wound Description (Comments): 3cmx1.25cm depth 0.25  Present on Admission: Yes     Diet Orders (From  admission, onward)     Start     Ordered   12/05/22 0900  Diet Heart Room service appropriate? Yes; Fluid consistency: Thin  Diet effective now       Question Answer Comment  Room service appropriate? Yes   Fluid consistency: Thin      12/05/22 0859            Objective: Vitals:   12/08/22 0536 12/08/22 0750 12/08/22 0758 12/08/22 0759  BP: 102/86   111/71  Pulse: 89   88  Resp: 18   (!) 28  Temp: (!) 97.5 F (36.4 C)   97.7 F (36.5 C)  TempSrc: Oral   Oral  SpO2: 96% (!) 87% 99% 99%  Weight:      Height:       No intake or output data in the 24 hours ending 12/08/22 0815 Filed Weights   12/03/22 1051 12/04/22 0420  Weight: 127.5 kg 107.1 kg    Scheduled Meds:  atorvastatin  20 mg Oral QPM   Chlorhexidine Gluconate Cloth  6 each Topical Q0600   enoxaparin (LOVENOX) injection  40 mg Subcutaneous Q24H   fluticasone furoate-vilanterol  1 puff Inhalation Daily   hydrocortisone sod succinate (SOLU-CORTEF) inj  50 mg Intravenous Daily   levothyroxine  175 mcg Oral Q0600   midodrine  5 mg Oral TID WC   mupirocin ointment  1 Application Nasal BID   pantoprazole  40 mg Oral Daily   pramipexole  1 mg Oral QHS   sodium chloride flush  3 mL Intravenous Q12H   tamsulosin  0.4 mg Oral QPM   Continuous Infusions:  sodium chloride     sodium chloride Stopped (12/04/22 2102)   piperacillin-tazobactam (ZOSYN)  IV 3.375 g (12/08/22 0606)    Nutritional status     Body mass index is 34.87 kg/m.  Data Reviewed:   CBC: Recent Labs  Lab 12/03/22 1122 12/04/22 0327 12/04/22 0936 12/04/22 1357 12/05/22 0842 12/06/22 0350 12/07/22 0516  WBC 20.4*   < > 41.8* 34.5* 35.6* 22.6* 15.7*  NEUTROABS 17.0*  --   --   --   --   --   --   HGB 15.5   < > 15.0 15.2 12.2* 12.3* 11.8*  HCT 50.1   < > 48.8 50.6 40.8 40.4 39.4  MCV 99.4   < > 100.4* 102.2* 102.0* 102.0* 100.5*  PLT 218   < > 210 192 180 179 175   < > = values in this interval not displayed.   Basic Metabolic  Panel: Recent Labs  Lab 12/04/22 0936 12/04/22 1357 12/05/22 0647 12/05/22 1812 12/06/22 0350 12/07/22 0516  NA 138 140 132* 135 132* 136  K 3.8 3.5 3.9 3.5 3.6 3.2*  CL 96* 94* 93* 85* 93* 95*  CO2 31 28 26 25 27 28   GLUCOSE 140* 86 180* 251* 198* 98  BUN 33* 36* 46* 54* 60* 61*  CREATININE 1.43* 1.73* 2.22* 2.60* 2.57* 2.44*  CALCIUM 8.4* 8.5* 7.9* 7.6* 7.8* 7.9*  MG 1.7  --  1.9  --  2.1 2.2   GFR: Estimated Creatinine Clearance: 27.2 mL/min (A) (by C-G formula based on SCr of 2.44 mg/dL (H)). Liver Function Tests: Recent Labs  Lab 12/03/22 1122 12/04/22 0936 12/04/22 1357 12/05/22 0647  AST 25 25 26 30   ALT 20 18 17  16  ALKPHOS 151* 143* 209* 155*  BILITOT 3.2* 3.6* 4.2* 5.3*  PROT 7.0 6.4* 6.3* 6.4*  ALBUMIN 2.4* 1.8* 1.7* 2.5*   Recent Labs  Lab 12/03/22 1122  LIPASE 21   No results for input(s): "AMMONIA" in the last 168 hours. Coagulation Profile: Recent Labs  Lab 12/03/22 1737  INR 1.3*   Cardiac Enzymes: No results for input(s): "CKTOTAL", "CKMB", "CKMBINDEX", "TROPONINI" in the last 168 hours. BNP (last 3 results) No results for input(s): "PROBNP" in the last 8760 hours. HbA1C: No results for input(s): "HGBA1C" in the last 72 hours. CBG: No results for input(s): "GLUCAP" in the last 168 hours. Lipid Profile: No results for input(s): "CHOL", "HDL", "LDLCALC", "TRIG", "CHOLHDL", "LDLDIRECT" in the last 72 hours. Thyroid Function Tests: No results for input(s): "TSH", "T4TOTAL", "FREET4", "T3FREE", "THYROIDAB" in the last 72 hours. Anemia Panel: No results for input(s): "VITAMINB12", "FOLATE", "FERRITIN", "TIBC", "IRON", "RETICCTPCT" in the last 72 hours. Sepsis Labs: Recent Labs  Lab 12/03/22 1355 12/04/22 1357 12/04/22 1525  LATICACIDVEN 1.4 3.9* 2.7*    Recent Results (from the past 240 hour(s))  SARS Coronavirus 2 by RT PCR (hospital order, performed in [facility] hospital lab) *cepheid single result test* Anterior Nasal Swab      Status: None   Collection Time: 12/03/22  3:37 PM   Specimen: Anterior Nasal Swab  Result Value Ref Range Status   SARS Coronavirus 2 by RT PCR NEGATIVE NEGATIVE Final    Comment: (NOTE) SARS-CoV-2 target nucleic acids are NOT DETECTED.  The SARS-CoV-2 RNA is generally detectable in upper and lower respiratory specimens during the acute phase of infection. The lowest concentration of SARS-CoV-2 viral copies this assay can detect is 250 copies / mL. A negative result does not preclude SARS-CoV-2 infection and should not be used as the sole basis for treatment or other patient management decisions.  A negative result may occur with improper specimen collection / handling, submission of specimen other than nasopharyngeal swab, presence of viral mutation(s) within the areas targeted by this assay, and inadequate number of viral copies (<250 copies / mL). A negative result must be combined with clinical observations, patient history, and epidemiological information.  Fact Sheet for Patients:   RoadLapTop.co.za  Fact Sheet for Healthcare Providers: http://kim-miller.com/  This test is not yet approved or  cleared by the [place] FDA and has been authorized for detection and/or diagnosis of SARS-CoV-2 by FDA under an Emergency Use Authorization (EUA).  This EUA will remain in effect (meaning this test can be used) for the duration of the COVID-19 declaration under Section 564(b)(1) of the Act, 21 U.S.C. section 360bbb-3(b)(1), unless the authorization is terminated or revoked sooner.  Performed at [facility], [address] [place], [place] [address]   Aerobic/Anaerobic Culture w Gram Stain (surgical/deep wound)     Status: None (Preliminary result)   Collection Time: 12/04/22 10:52 AM   Specimen: BILE  Result Value Ref Range Status   Specimen Description BILE  Final   Special Requests bile  Final   Gram Stain   Final    NO WBC  SEEN RARE GRAM NEGATIVE RODS Performed at [facility], [address] [place], [place] [address]    Culture   Final    ABUNDANT ESCHERICHIA COLI Confirmed Extended Spectrum Beta-Lactamase Producer (ESBL).  In bloodstream infections from ESBL organisms, carbapenems are preferred over piperacillin/tazobactam. They are shown to have a lower risk of mortality. NO ANAEROBES ISOLATED; CULTURE IN PROGRESS FOR  5 DAYS    Report Status PENDING  Incomplete   Organism ID, Bacteria ESCHERICHIA COLI  Final      Susceptibility   Escherichia coli - MIC*    AMPICILLIN >=32 RESISTANT Resistant     CEFEPIME >=32 RESISTANT Resistant     CEFTAZIDIME >=64 RESISTANT Resistant     CEFTRIAXONE >=64 RESISTANT Resistant     CIPROFLOXACIN >=4 RESISTANT Resistant     GENTAMICIN <=1 SENSITIVE Sensitive     IMIPENEM 0.5 SENSITIVE Sensitive     TRIMETH/SULFA >=320 RESISTANT Resistant     AMPICILLIN/SULBACTAM >=32 RESISTANT Resistant     PIP/TAZO 8 SENSITIVE Sensitive     * ABUNDANT ESCHERICHIA COLI  MRSA Next Gen by PCR, Nasal     Status: Abnormal   Collection Time: 12/04/22  2:01 PM   Specimen: Nasal Mucosa; Nasal Swab  Result Value Ref Range Status   MRSA by PCR Next Gen DETECTED (A) NOT DETECTED Final    Comment: (NOTE) The GeneXpert MRSA Assay (FDA approved for NASAL specimens only), is one component of a comprehensive MRSA colonization surveillance program. It is not intended to diagnose MRSA infection nor to guide or monitor treatment for MRSA infections. Test performance is not FDA approved in patients less than 61 years old. Performed at [facility], [address], [place], [place] [address]   Remove and replace urinary cath (placed > 5 days) then obtain urine culture from new indwelling urinary catheter.     Status: Abnormal   Collection Time: 12/05/22  3:05 PM   Specimen: Urine, Catheterized  Result Value Ref Range Status   Specimen Description URINE, CATHETERIZED  Final    Special Requests   Final    NONE Performed at [facility], [address], [place], [place] [address]    Culture 30,000 COLONIES/mL PSEUDOMONAS AERUGINOSA (A)  Final   Report Status 12/07/2022 FINAL  Final   Organism ID, Bacteria PSEUDOMONAS AERUGINOSA (A)  Final      Susceptibility   Pseudomonas aeruginosa - MIC*    CEFTAZIDIME 2 SENSITIVE Sensitive     CIPROFLOXACIN <=0.25 SENSITIVE Sensitive     GENTAMICIN 4 SENSITIVE Sensitive     IMIPENEM 1 SENSITIVE Sensitive     PIP/TAZO <=4 SENSITIVE Sensitive     * 30,000 COLONIES/mL PSEUDOMONAS AERUGINOSA         Radiology Studies: No results found.         LOS: 5 days   Time spent= 35 mins    Miguel Rota, MD [facility]  If 7PM-7AM, please contact night-coverage  12/08/2022, 8:15 AM

## 2022-12-09 ENCOUNTER — Other Ambulatory Visit: Payer: Self-pay

## 2022-12-09 DIAGNOSIS — K810 Acute cholecystitis: Secondary | ICD-10-CM

## 2022-12-09 LAB — CBC
HCT: 47.2 % (ref 39.0–52.0)
Hemoglobin: 14.1 g/dL (ref 13.0–17.0)
MCH: 30.5 pg (ref 26.0–34.0)
MCHC: 29.9 g/dL — ABNORMAL LOW (ref 30.0–36.0)
MCV: 101.9 fL — ABNORMAL HIGH (ref 80.0–100.0)
Platelets: 181 10*3/uL (ref 150–400)
RBC: 4.63 MIL/uL (ref 4.22–5.81)
RDW: 17.7 % — ABNORMAL HIGH (ref 11.5–15.5)
WBC: 13.9 10*3/uL — ABNORMAL HIGH (ref 4.0–10.5)
nRBC: 0 % (ref 0.0–0.2)

## 2022-12-09 LAB — MAGNESIUM: Magnesium: 2.1 mg/dL (ref 1.7–2.4)

## 2022-12-09 LAB — AEROBIC/ANAEROBIC CULTURE W GRAM STAIN (SURGICAL/DEEP WOUND): Gram Stain: NONE SEEN

## 2022-12-09 LAB — COMPREHENSIVE METABOLIC PANEL
ALT: 19 U/L (ref 0–44)
AST: 23 U/L (ref 15–41)
Albumin: 2.3 g/dL — ABNORMAL LOW (ref 3.5–5.0)
Alkaline Phosphatase: 165 U/L — ABNORMAL HIGH (ref 38–126)
Anion gap: 12 (ref 5–15)
BUN: 43 mg/dL — ABNORMAL HIGH (ref 8–23)
CO2: 27 mmol/L (ref 22–32)
Calcium: 8.8 mg/dL — ABNORMAL LOW (ref 8.9–10.3)
Chloride: 100 mmol/L (ref 98–111)
Creatinine, Ser: 1.48 mg/dL — ABNORMAL HIGH (ref 0.61–1.24)
GFR, Estimated: 46 mL/min — ABNORMAL LOW (ref >60)
Glucose, Bld: 115 mg/dL — ABNORMAL HIGH (ref 70–99)
Potassium: 4 mmol/L (ref 3.5–5.1)
Sodium: 139 mmol/L (ref 135–145)
Total Bilirubin: 2 mg/dL — ABNORMAL HIGH (ref 0.3–1.2)
Total Protein: 6.8 g/dL (ref 6.5–8.1)

## 2022-12-09 LAB — PHOSPHORUS: Phosphorus: 3.2 mg/dL (ref 2.5–4.6)

## 2022-12-09 MED ORDER — SODIUM CHLORIDE 0.9 % IV SOLN
1.0000 g | Freq: Two times a day (BID) | INTRAVENOUS | Status: DC
  Administered 2022-12-09 – 2022-12-10 (×3): 1 g via INTRAVENOUS
  Filled 2022-12-09 (×4): qty 20

## 2022-12-09 NOTE — Plan of Care (Signed)

## 2022-12-09 NOTE — TOC Progression Note (Addendum)
Transition of Care [facility]) - Progression Note    Patient Details  Name: Vernon Campbell MRN: [id number] Date of Birth: Aug 05, 1940  Transition of Care [facility]) CM/SW Contact  Savion Washam A [place], [place] Phone Number: 12/09/2022, 11:51 AM  Clinical Narrative:     Update 1350 CSW received contact from [facility] regarding approval for medication of Invanz IV for pt, waiting for follow up from facility regarding PICC line need.   CSW was contacted by [facility], they requested PICC line for pt for IV Ivanz. Pt can DC as early as tomorrow pending medical stability.  CSW notified provider.    CSW contacted [facility] to inquire about IV meds at facility. CSW left voicemail with liaison Whitney with contact information to reach back out to CSW.   TOC will continue to follow.   Expected Discharge Plan: Skilled Nursing Facility Barriers to Discharge: Continued Medical Work up  Expected Discharge Plan and Services In-house Referral: Clinical Social Work Discharge Planning Services: CM Consult Post Acute Care Choice: Skilled Nursing Facility Living arrangements for the past 2 months: Skilled Nursing Facility                                       Social Determinants of Health (SDOH) Interventions SDOH Screenings   Food Insecurity: No Food Insecurity (10/23/2022)  Housing: Low Risk  (10/23/2022)  Transportation Needs: No Transportation Needs (10/23/2022)  Utilities: Not At Risk (10/23/2022)  Social Connections: Unknown (07/31/2021)   Received from [facility]  Tobacco Use: Medium Risk (12/03/2022)    Readmission Risk Interventions    12/06/2022   11:58 AM 10/24/2022    3:34 PM 09/28/2022    2:05 PM  Readmission Risk Prevention Plan  Transportation Screening Complete Complete Complete  PCP or Specialist Appt within 3-5 Days  Complete Complete  HRI or Home Care Consult  Complete Complete  Social Work Consult for Recovery Care Planning/Counseling  Complete Complete  Palliative  Care Screening  Complete Not Applicable  Medication Review [facility]) Referral to Pharmacy Complete Complete  HRI or Home Care Consult Complete    SW Recovery Care/Counseling Consult Complete    Skilled Nursing Facility Complete

## 2022-12-09 NOTE — Progress Notes (Signed)
OT Cancellation Note  Patient Details Name: Ora Crary MRN: [id number] DOB: [birth year]   Cancelled Treatment:    Reason Eval/Treat Not Completed: OT screened, no needs identified, will sign off.   Per OT note from Richard on 9/17, pt from SNF with hospice and able to feed self. Planned for dc back to hospice with no rehab needs. Thank you for this order.   Tyler Deis, OTR/L [facility] Acute Rehabilitation Office: [phone]   Myrla Halsted 12/09/2022, 7:12 AM

## 2022-12-09 NOTE — Progress Notes (Signed)
Referring Provider(s): Dr Leonard Schwartz. Icard  Dr. Pricilla Loveless  Supervising Physician: Irish Lack  Patient Status:  [facility] - In-pt  Chief Complaint:  Cholecystitis = s/p drain 12/04/22  Brief History:  Vernon Campbell is an 82 y.o. male hospice patient and resident at [facility] nursing home with past medical history significant for aortic stenosis, chronic cor pulmonale, CHF, obesity, hyperlipidemia, hypothyroidism, sleep apnea, osteoarthritis, paroxysmal atrial fibrillation, venous insufficiency, depression, vitamin D deficiency, hypertension, and COPD.    He was admitted to [facility] on 12/03/22 with persistent moderate to severe right upper quadrant pain.    Imaging revealed gangrenous acute cholecystitis.    Patient was seen by surgical team and is not a surgical candidate.    He was transferred to [facility] for percutaneous cholecystostomy which was done 12/04/22.   Subjective:  Watching TV. No complaints  Allergies: Patient has no known allergies.  Medications: Prior to Admission medications   Medication Sig Start Date End Date Taking? Authorizing Provider  albuterol (PROVENTIL) (2.5 MG/3ML) 0.083% nebulizer solution Take 3 mLs (2.5 mg total) by nebulization every 2 (two) hours as needed for shortness of breath. 12/15/21  Yes Emokpae, Courage, MD  Amino Acids-Protein Hydrolys (FEEDING SUPPLEMENT, PRO-STAT 64,) LIQD Take 30 mLs by mouth daily.   Yes [provider]  ascorbic acid (VITAMIN C) 500 MG tablet Take 500 mg by mouth 2 (two) times daily.   Yes [provider]  fentaNYL (DURAGESIC) 25 MCG/HR Place 1 patch onto the skin every 3 (three) days.   Yes [provider]  fluticasone furoate-vilanterol (BREO ELLIPTA) 100-25 MCG/ACT AEPB Inhale 1 puff into the lungs daily. 12/16/21  Yes Shon Hale, MD  hydrOXYzine (ATARAX) 25 MG tablet Take 25 mg by mouth every 6 (six) hours as needed for itching.   Yes [provider]  ketoconazole  (NIZORAL) 2 % shampoo Apply 1 application  topically See admin instructions. Apply to scalp (shampoo) topically during day shift every Tuesday and Friday for tinea versicolor (bath days)   Yes [provider]  lactulose (CHRONULAC) 10 GM/15ML solution Take 15 mLs by mouth daily.   Yes [provider]  levothyroxine (SYNTHROID) 175 MCG tablet Take 175 mcg by mouth daily before breakfast.   Yes [provider]  Menthol-Zinc Oxide (CALMOSEPTINE) 0.44-20.6 % OINT Apply 1 application  topically 3 (three) times daily.   Yes [provider]  Multiple Vitamin (MULTIVITAMIN) tablet Take 1 tablet by mouth daily.   Yes [provider]  ondansetron (ZOFRAN-ODT) 4 MG disintegrating tablet Take 4 mg by mouth See admin instructions. Give 4 mg every 4 hours as needed for nausea, vomiting for up to 2 doses.   Yes [provider]  oxyCODONE (ROXICODONE) 15 MG immediate release tablet Take 15 mg by mouth every 4 (four) hours as needed for pain.   Yes [provider]  OXYGEN Inhale 3 L/min into the lungs continuous.   Yes [provider]  polyethylene glycol (MIRALAX / GLYCOLAX) 17 g packet Take 17 g by mouth daily.   Yes [provider]  potassium chloride SA (KLOR-CON) 20 MEQ tablet Take 20 mEq by mouth daily.   Yes [provider]  pramipexole (MIRAPEX) 1 MG tablet Take 1 tablet (1 mg total) by mouth at bedtime. 10/28/22  Yes Sherryll Burger, Pratik D, DO  senna (SENOKOT) 8.6 MG TABS tablet Take 17.2 mg by mouth 2 (two) times daily.   Yes [provider]  spironolactone (ALDACTONE) 25  MG tablet Take 25 mg by mouth daily.   Yes [provider]  spironolactone (ALDACTONE) 50 MG tablet Take 1 tablet (50 mg total) by mouth daily. 10/06/22 01/29/23 Yes Shah, Pratik D, DO  tamsulosin (FLOMAX) 0.4 MG CAPS capsule Take 1 capsule (0.4 mg total) by mouth daily after supper. Patient taking differently: Take 0.4 mg by mouth at bedtime.  09/06/22  Yes Donnita Falls, FNP  Torsemide 40 MG TABS Take 40 mg by mouth See admin instructions. Take 60 mg every morning and 40 mg every evening Patient taking differently: Take 40 mg by mouth See admin instructions. 40 mg once daily at 1400. 10/06/22  Yes Shah, Pratik D, DO  Torsemide 60 MG TABS Take 60 mg by mouth daily before breakfast.   Yes [provider]  zinc sulfate 220 (50 Zn) MG capsule Take 220 mg by mouth daily.   Yes [provider]     Vital Signs: BP 108/73   Pulse 86   Temp 97.7 F (36.5 C)   Resp 16   Ht 5\' 9"  (1.753 m)   Wt 236 lb 1.8 oz (107.1 kg)   SpO2 96%   BMI 34.87 kg/m   Physical Exam Vitals reviewed.  Cardiovascular:     Rate and Rhythm: Normal rate.  Pulmonary:     Effort: Pulmonary effort is normal. No respiratory distress.  Skin:    General: Skin is warm and dry.  Neurological:     Mental Status: He is alert.   Drain Location: RUQ Size: Fr size: 10 Fr Date of placement: 12/04/22  Currently to: Drain collection device: gravity 24 hour output:  Output by Drain (mL) 12/07/22 0701 - 12/07/22 1900 12/07/22 1901 - 12/08/22 0700 12/08/22 0701 - 12/08/22 1900 12/08/22 1901 - 12/09/22 0700 12/09/22 0701 - 12/09/22 1406  Biliary Tube Cook slip-coat 10.2 Fr. RUQ   30 30     Current examination: Flushes/aspirates easily.  Insertion site unremarkable. Suture and stat lock in place. Dressed appropriately.    Labs:  CBC: Recent Labs    12/05/22 0842 12/06/22 0350 12/07/22 0516 12/08/22 0734  WBC 35.6* 22.6* 15.7* 13.1*  HGB 12.2* 12.3* 11.8* 13.0  HCT 40.8 40.4 39.4 43.1  PLT 180 179 175 184    COAGS: Recent Labs    12/03/22 1737  INR 1.3*    BMP: Recent Labs    12/05/22 1812 12/06/22 0350 12/07/22 0516 12/08/22 0734  NA 135 132* 136 136  K 3.5 3.6 3.2* 3.6  CL 85* 93* 95* 98  CO2 25 27 28 29   GLUCOSE 251* 198* 98 116*  BUN 54* 60* 61* 54*  CALCIUM 7.6* 7.8* 7.9* 8.5*  CREATININE 2.60* 2.57* 2.44*  2.08*  GFRNONAA 24* 24* 25* 31*    LIVER FUNCTION TESTS: Recent Labs    12/03/22 1122 12/04/22 0936 12/04/22 1357 12/05/22 0647  BILITOT 3.2* 3.6* 4.2* 5.3*  AST 25 25 26 30   ALT 20 18 17 16   ALKPHOS 151* 143* 209* 155*  PROT 7.0 6.4* 6.3* 6.4*  ALBUMIN 2.4* 1.8* 1.7* 2.5*    Assessment and Plan:  Gangrenous cholecystitis - not a surgical candidate - status post perc chole by Dr. Archer Asa 12/04/22  Continue flushes with 5 cc NS.  Record output Q shift.  Dressing changes QD or PRN if soiled.   Call IR APP or on call IR MD if difficulty flushing or sudden change in drain output.  Repeat imaging/possible drain injection once output < 10  mL/QD (excluding flush material). Consideration for drain removal if output is < 10 mL/QD (excluding flush material), pending discussion with the providing surgical service.  Recommendations:  Percutaneous cholecystostomy drain to remain in place at least 6 weeks.   Recommend fluoroscopy with injection of the drain in IR to evaluate for patency of the cystic duct.  Given that this patient is hospice, he will likely NEVER be a candidate for cholecystectomy, so the drain might can be capped.  If symptoms recur, then place to gravity bag again.  If trial is successful, could consider removing the drain.  If trial in unsuccessful, then patient will need routine exchanges of the  chole tube about every 8-10 weeks.  Given this patients condition and immobility, he will be best served at [facility] to evaluate his drain and NOT at the IR outpatient clinic.   Electronically Signed: Gwynneth Macleod, PA-C 12/09/2022, 2:03 PM    I spent a total of 15 Minutes at the the patient's bedside AND on the patient's hospital floor or unit, greater than 50% of which was counseling/coordinating care for f/u perc chole.

## 2022-12-09 NOTE — Progress Notes (Signed)
PROGRESS NOTE    Vernon Campbell  AOZ:[id number] DOB: 1941-03-21 DOA: 12/03/2022 PCP: System, Provider Not In     Brief Narrative:  82 year old with history of CHF, pulmonary hypertension, COPD, P A-fib admitted for gangrenous cholecystitis was deemed not to be a surgical candidate.  Patient underwent IR continuous cholecystostomy on 9/14.  Postprocedure had to be placed on pressors due to hypotension and admitted to the ICU.  Eventually transferred out on 9/17 as he was off pressors and doing better. AKI slowly is improving and discontinued Hydrocortisone and Midodrine.  Urine cultures grew Pseudomonas, wound cultures grew ESBL.  Does have chronic Foley in place.  Currently on IV Abx.  Eventually will return back to his facility [facility] under hospice care.     Assessment & Plan:  Principal Problem:   Acute gangrenous cholecystitis Active Problems:   Chronic respiratory failure with hypoxia (HCC)   Chronic diastolic CHF (congestive heart failure) (HCC)   Hypokalemia   Essential hypertension   Hypothyroidism   Hyperlipidemia   Obesity, Class III, BMI 40-49.9 (morbid obesity) (HCC)   Cellulitis   Septic shock secondary to gangrenous cholecystitis - Status post percutaneous cholecystostomy by IR on 9/14.  PCCM started IV Zosyn > Meropenem.  Cultures have grown ESBL.  Will plan for total 10 days of Abx (received 6 days of Zosyn, 4 more days of Meropenem should be ok if clinically stable) WBC improving -Off midodrine and hydrocortisone now.    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  Rate controlled, not on long-term anticoagulation   Acute kidney injury -Creatinine peaked at 2.6 > 2.08. Baseline 1.4. IVF, slowly improving   Hypokalemia -As needed repletion   Pulmonary hypertension with cor pulmonale -Supportive care bronchodilators   COPD with  chronic hypoxia 3 L nasal cannula -Bronchodilators   Congestive heart failure with preserved EF Moderate to severe AS - Echo in April 2024 showed EF of 65%.  Aldactone, torsemide on hold   Essential hypertension -Due to low blood pressure, antihypertensives on hold.  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  Followed by hospital/facility.  In the past several weeks he has been total care requiring oral lift to get out of bed.  Plan would be for him to return to [facility]   DVT prophylaxis: Lovenox Code Status: DNR Family Communication:   Status is: Inpatient Remains inpatient appropriate because: Blood pressure slowly improving, weaning off oxygen, awaiting renal function to stabilize.  Hopefully back to his facility tomorrow if not later today         Subjective: Doing ok no new complaints.    Examination:  General exam: Appears calm and comfortable  Respiratory system: Clear to auscultation. Respiratory effort normal. Cardiovascular system: S1 & S2 heard, RRR. No JVD, murmurs, rubs, gallops or clicks. No pedal edema. Gastrointestinal system: Abdomen is nondistended, soft and nontender. No organomegaly or masses felt. Normal bowel sounds heard. Central nervous system: Alert and oriented. No focal neurological deficits. Extremities: Symmetric 4 x 5 power. Skin: No rashes, lesions or ulcers Psychiatry: Judgement and insight appear normal. RUQ Drain in place.   Pressure Injury 12/04/22 Coccyx Mid;Medial Stage 3 -  Full thickness tissue loss. Subcutaneous fat may be visible but bone, tendon or muscle are NOT  exposed. 5 x 0.5 (Active)  12/04/22 1345  Location: Coccyx  Location Orientation: Mid;Medial  Staging: Stage 3 -  Full thickness tissue loss. Subcutaneous fat may be visible but bone, tendon or muscle are NOT exposed.  Wound  Description (Comments): 5 x 0.5  Present on Admission: Yes     Pressure Injury 12/04/22 Medial;Mid Stage 2 -  Partial thickness loss of dermis presenting as a shallow open injury with a red, pink wound bed without slough. 3cmx1.25cm depth 0.25 (Active)  12/04/22 1345  Location:   Location Orientation: Medial;Mid  Staging: Stage 2 -  Partial thickness loss of dermis presenting as a shallow open injury with a red, pink wound bed without slough.  Wound Description (Comments): 3cmx1.25cm depth 0.25  Present on Admission: Yes     Diet Orders (From admission, onward)     Start     Ordered   12/05/22 0900  Diet Heart Room service appropriate? Yes; Fluid consistency: Thin  Diet effective now       Question Answer Comment  Room service appropriate? Yes   Fluid consistency: Thin      12/05/22 0859            Objective: Vitals:   12/08/22 2040 12/09/22 0414 12/09/22 0741 12/09/22 0852  BP: 101/66 104/75 (!) 111/52 108/73  Pulse: 90 (!) 102 (!) 107 86  Resp: 17 17 16    Temp: 98.1 F (36.7 C) (!) 97.4 F (36.3 C) 97.7 F (36.5 C)   TempSrc: Oral Oral    SpO2: 90% 98% 96%   Weight:      Height:        Intake/Output Summary (Last 24 hours) at 12/09/2022 1038 Last data filed at 12/09/2022 0649 Gross per 24 hour  Intake 471.62 ml  Output 1760 ml  Net -1288.38 ml   Filed Weights   12/03/22 1051 12/04/22 0420  Weight: 127.5 kg 107.1 kg    Scheduled Meds:  atorvastatin  20 mg Oral QPM   Chlorhexidine Gluconate Cloth  6 each Topical Q0600   enoxaparin (LOVENOX) injection  40 mg Subcutaneous Q24H   fluticasone furoate-vilanterol  1 puff Inhalation Daily   levothyroxine  175 mcg Oral Q0600   midodrine  5 mg Oral TID WC   pantoprazole  40 mg Oral Daily   pramipexole  1 mg Oral QHS   sodium chloride flush  3 mL Intravenous Q12H   tamsulosin  0.4 mg Oral QPM   Continuous Infusions:  sodium chloride     sodium chloride Stopped (12/04/22 2102)    Nutritional status      Body mass index is 34.87 kg/m.  Data Reviewed:   CBC: Recent Labs  Lab 12/03/22 1122 12/04/22 0327 12/04/22 1357 12/05/22 0842 12/06/22 0350 12/07/22 0516 12/08/22 0734  WBC 20.4*   < > 34.5* 35.6* 22.6* 15.7* 13.1*  NEUTROABS 17.0*  --   --   --   --   --   --   HGB 15.5   < > 15.2 12.2* 12.3* 11.8* 13.0  HCT 50.1   < > 50.6 40.8 40.4 39.4 43.1  MCV 99.4   < > 102.2* 102.0* 102.0* 100.5* 101.9*  PLT 218   < > 192 180 179 175 184   < > = values in this interval not displayed.   Basic Metabolic Panel: Recent Labs  Lab 12/04/22 0936 12/04/22 1357 12/05/22 0647 12/05/22 1812 12/06/22 0350 12/07/22 0516 12/08/22 0734  NA 138   < > 132*  135 132* 136 136  K 3.8   < > 3.9 3.5 3.6 3.2* 3.6  CL 96*   < > 93* 85* 93* 95* 98  CO2 31   < > 26 25 27 28 29   GLUCOSE 140*   < > 180* 251* 198* 98 116*  BUN 33*   < > 46* 54* 60* 61* 54*  CREATININE 1.43*   < > 2.22* 2.60* 2.57* 2.44* 2.08*  CALCIUM 8.4*   < > 7.9* 7.6* 7.8* 7.9* 8.5*  MG 1.7  --  1.9  --  2.1 2.2 2.3   < > = values in this interval not displayed.   GFR: Estimated Creatinine Clearance: 31.9 mL/min (A) (by C-G formula based on SCr of 2.08 mg/dL (H)). Liver Function Tests: Recent Labs  Lab 12/03/22 1122 12/04/22 0936 12/04/22 1357 12/05/22 0647  AST 25 25 26 30   ALT 20 18 17 16   ALKPHOS 151* 143* 209* 155*  BILITOT 3.2* 3.6* 4.2* 5.3*  PROT 7.0 6.4* 6.3* 6.4*  ALBUMIN 2.4* 1.8* 1.7* 2.5*   Recent Labs  Lab 12/03/22 1122  LIPASE 21   No results for input(s): "AMMONIA" in the last 168 hours. Coagulation Profile: Recent Labs  Lab 12/03/22 1737  INR 1.3*   Cardiac Enzymes: No results for input(s): "CKTOTAL", "CKMB", "CKMBINDEX", "TROPONINI" in the last 168 hours. BNP (last 3 results) No results for input(s): "PROBNP" in the last 8760 hours. HbA1C: No results for input(s): "HGBA1C" in the last 72 hours. CBG: No results for input(s): "GLUCAP" in the last 168 hours. Lipid Profile: No results for  input(s): "CHOL", "HDL", "LDLCALC", "TRIG", "CHOLHDL", "LDLDIRECT" in the last 72 hours. Thyroid Function Tests: No results for input(s): "TSH", "T4TOTAL", "FREET4", "T3FREE", "THYROIDAB" in the last 72 hours. Anemia Panel: No results for input(s): "VITAMINB12", "FOLATE", "FERRITIN", "TIBC", "IRON", "RETICCTPCT" in the last 72 hours. Sepsis Labs: Recent Labs  Lab 12/03/22 1355 12/04/22 1357 12/04/22 1525  LATICACIDVEN 1.4 3.9* 2.7*    Recent Results (from the past 240 hour(s))  SARS Coronavirus 2 by RT PCR (hospital order, performed in [facility] hospital lab) *cepheid single result test* Anterior Nasal Swab     Status: None   Collection Time: 12/03/22  3:37 PM   Specimen: Anterior Nasal Swab  Result Value Ref Range Status   SARS Coronavirus 2 by RT PCR NEGATIVE NEGATIVE Final    Comment: (NOTE) SARS-CoV-2 target nucleic acids are NOT DETECTED.  The SARS-CoV-2 RNA is generally detectable in upper and lower respiratory specimens during the acute phase of infection. The lowest concentration of SARS-CoV-2 viral copies this assay can detect is 250 copies / mL. A negative result does not preclude SARS-CoV-2 infection and should not be used as the sole basis for treatment or other patient management decisions.  A negative result may occur with improper specimen collection / handling, submission of specimen other than nasopharyngeal swab, presence of viral mutation(s) within the areas targeted by this assay, and inadequate number of viral copies (<250 copies / mL). A negative result must be combined with clinical observations, patient history, and epidemiological information.  Fact Sheet for Patients:   RoadLapTop.co.za  Fact Sheet for Healthcare Providers: http://kim-miller.com/  This test is not yet approved or  cleared by the [place] FDA and has been authorized for detection and/or diagnosis of SARS-CoV-2 by FDA under an  Emergency Use Authorization (EUA).  This EUA will remain in effect (meaning this test can be used) for the duration of the COVID-19  declaration under Section 564(b)(1) of the Act, 21 U.S.C. section 360bbb-3(b)(1), unless the authorization is terminated or revoked sooner.  Performed at [facility], [address] [place], [place] [address]   Aerobic/Anaerobic Culture w Gram Stain (surgical/deep wound)     Status: None (Preliminary result)   Collection Time: 12/04/22 10:52 AM   Specimen: BILE  Result Value Ref Range Status   Specimen Description BILE  Final   Special Requests bile  Final   Gram Stain   Final    NO WBC SEEN RARE GRAM NEGATIVE RODS Performed at [facility], [address] [place], [place] [address]    Culture   Final    ABUNDANT ESCHERICHIA COLI Confirmed Extended Spectrum Beta-Lactamase Producer (ESBL).  In bloodstream infections from ESBL organisms, carbapenems are preferred over piperacillin/tazobactam. They are shown to have a lower risk of mortality. NO ANAEROBES ISOLATED; CULTURE IN PROGRESS FOR 5 DAYS    Report Status PENDING  Incomplete   Organism ID, Bacteria ESCHERICHIA COLI  Final      Susceptibility   Escherichia coli - MIC*    AMPICILLIN >=32 RESISTANT Resistant     CEFEPIME >=32 RESISTANT Resistant     CEFTAZIDIME >=64 RESISTANT Resistant     CEFTRIAXONE >=64 RESISTANT Resistant     CIPROFLOXACIN >=4 RESISTANT Resistant     GENTAMICIN <=1 SENSITIVE Sensitive     IMIPENEM 0.5 SENSITIVE Sensitive     TRIMETH/SULFA >=320 RESISTANT Resistant     AMPICILLIN/SULBACTAM >=32 RESISTANT Resistant     PIP/TAZO 8 SENSITIVE Sensitive     * ABUNDANT ESCHERICHIA COLI  MRSA Next Gen by PCR, Nasal     Status: Abnormal   Collection Time: 12/04/22  2:01 PM   Specimen: Nasal Mucosa; Nasal Swab  Result Value Ref Range Status   MRSA by PCR Next Gen DETECTED (A) NOT DETECTED Final    Comment: (NOTE) The GeneXpert MRSA Assay (FDA approved for NASAL specimens  only), is one component of a comprehensive MRSA colonization surveillance program. It is not intended to diagnose MRSA infection nor to guide or monitor treatment for MRSA infections. Test performance is not FDA approved in patients less than 37 years old. Performed at [facility], [address], [place], [place] [address]   Remove and replace urinary cath (placed > 5 days) then obtain urine culture from new indwelling urinary catheter.     Status: Abnormal   Collection Time: 12/05/22  3:05 PM   Specimen: Urine, Catheterized  Result Value Ref Range Status   Specimen Description URINE, CATHETERIZED  Final   Special Requests   Final    NONE Performed at [facility], [address] [place], [place] [address]    Culture 30,000 COLONIES/mL PSEUDOMONAS AERUGINOSA (A)  Final   Report Status 12/07/2022 FINAL  Final   Organism ID, Bacteria PSEUDOMONAS AERUGINOSA (A)  Final      Susceptibility   Pseudomonas aeruginosa - MIC*    CEFTAZIDIME 2 SENSITIVE Sensitive     CIPROFLOXACIN <=0.25 SENSITIVE Sensitive     GENTAMICIN 4 SENSITIVE Sensitive     IMIPENEM 1 SENSITIVE Sensitive     PIP/TAZO <=4 SENSITIVE Sensitive     * 30,000 COLONIES/mL PSEUDOMONAS AERUGINOSA         Radiology Studies: No results found.         LOS: 6 days   Time spent= 35 mins    Miguel Rota, MD [facility]  If 7PM-7AM, please contact  night-coverage  12/09/2022, 10:38 AM

## 2022-12-09 NOTE — Progress Notes (Signed)
Patient ID: Donivan Burau, male   DOB: [birth year], 82 y.o.   MRN: [id number]    Progress Note from the Palliative Medicine Team at [facility]   Patient Name: Yandell Colglazier        Date: 12/09/2022 DOB: 10/16/1940  Age: 82 y.o. MRN#: [id number] Attending Physician: Miguel Rota, MD Primary Care Physician: System, Provider Not In Admit Date: 12/03/2022   In attempt to coordinate family meeting, hoping to clarify GOCs into the future I left phone message for son, await callback.   Patient himself is "ready" to go back to facility.  It is my understanding he has hospice service at this time.  It would be beneficial to complete a MOST form for future GOCs.  No charge  Lorinda Creed NP  Palliative Medicine Team Team Phone # [phone] Pager [phone]

## 2022-12-09 NOTE — Progress Notes (Signed)
PT Cancellation Note  Patient Details Name: Aldren Bowar MRN: [id number] DOB: 1940/04/22   Cancelled Treatment:    Reason Eval/Treat Not Completed: PT screened, no needs identified, will sign off. Reordered and no change from note 9/18. MD aware, will sign off  From Ashly, PT note 9/17: PT screened, no needs identified, will sign off. Spoke with Hospice representative who follows him for care. She reports that he is total care and requires hoyer lift to get up OOB and hasn't been out of bed in 6 weeks. Pt with no skilled acute PT needs as pt under hospice care and will be returning to [facility] long term care with continues hospice care.   Masiel Gentzler B Akito Boomhower 12/09/2022, 7:07 AM Merryl Hacker, PT Acute Rehabilitation Services Office: [phone]

## 2022-12-10 LAB — COMPREHENSIVE METABOLIC PANEL
ALT: 18 U/L (ref 0–44)
AST: 21 U/L (ref 15–41)
Albumin: 2.2 g/dL — ABNORMAL LOW (ref 3.5–5.0)
Alkaline Phosphatase: 175 U/L — ABNORMAL HIGH (ref 38–126)
Anion gap: 10 (ref 5–15)
BUN: 37 mg/dL — ABNORMAL HIGH (ref 8–23)
CO2: 29 mmol/L (ref 22–32)
Calcium: 8.7 mg/dL — ABNORMAL LOW (ref 8.9–10.3)
Chloride: 100 mmol/L (ref 98–111)
Creatinine, Ser: 1.47 mg/dL — ABNORMAL HIGH (ref 0.61–1.24)
GFR, Estimated: 47 mL/min — ABNORMAL LOW (ref >60)
Glucose, Bld: 118 mg/dL — ABNORMAL HIGH (ref 70–99)
Potassium: 4 mmol/L (ref 3.5–5.1)
Sodium: 139 mmol/L (ref 135–145)
Total Bilirubin: 1.8 mg/dL — ABNORMAL HIGH (ref 0.3–1.2)
Total Protein: 6.5 g/dL (ref 6.5–8.1)

## 2022-12-10 LAB — CBC
HCT: 48.2 % (ref 39.0–52.0)
Hemoglobin: 13.9 g/dL (ref 13.0–17.0)
MCH: 29.8 pg (ref 26.0–34.0)
MCHC: 28.8 g/dL — ABNORMAL LOW (ref 30.0–36.0)
MCV: 103.4 fL — ABNORMAL HIGH (ref 80.0–100.0)
Platelets: 188 10*3/uL (ref 150–400)
RBC: 4.66 MIL/uL (ref 4.22–5.81)
RDW: 17.8 % — ABNORMAL HIGH (ref 11.5–15.5)
WBC: 17.4 10*3/uL — ABNORMAL HIGH (ref 4.0–10.5)
nRBC: 0.1 % (ref 0.0–0.2)

## 2022-12-10 LAB — MAGNESIUM: Magnesium: 2 mg/dL (ref 1.7–2.4)

## 2022-12-10 LAB — GLUCOSE, CAPILLARY: Glucose-Capillary: 120 mg/dL — ABNORMAL HIGH (ref 70–99)

## 2022-12-10 MED ORDER — ATORVASTATIN CALCIUM 20 MG PO TABS: 20.0000 mg | ORAL_TABLET | Freq: Every evening | ORAL | Status: DC

## 2022-12-10 MED ORDER — SODIUM CHLORIDE 0.9% FLUSH: 10.0000 mL | INTRAVENOUS | Status: DC | PRN

## 2022-12-10 MED ORDER — OXYCODONE HCL 5 MG PO TABS: 5.0000 mg | ORAL_TABLET | ORAL | 0 refills | Status: DC | PRN

## 2022-12-10 MED ORDER — HEPARIN SOD (PORK) LOCK FLUSH 100 UNIT/ML IV SOLN
250.0000 [IU] | INTRAVENOUS | Status: AC | PRN
  Administered 2022-12-10: 250 [IU]

## 2022-12-10 MED ORDER — MIDODRINE HCL 5 MG PO TABS: 5.0000 mg | ORAL_TABLET | Freq: Three times a day (TID) | ORAL | Status: DC

## 2022-12-10 MED ORDER — SODIUM CHLORIDE 0.9 % IV SOLN: 1.0000 g | Freq: Two times a day (BID) | INTRAVENOUS | Status: AC

## 2022-12-10 MED ORDER — CHLORHEXIDINE GLUCONATE CLOTH 2 % EX PADS
6.0000 | MEDICATED_PAD | Freq: Every day | CUTANEOUS | Status: DC
  Administered 2022-12-10: 6 via TOPICAL

## 2022-12-10 NOTE — Discharge Summary (Addendum)
Vernon Campbell WRU:[id number] DOB: [birth year] DOA: 12/03/2022  PCP: System, Provider Not In  Admit date: 12/03/2022 Discharge date: 12/10/2022  Time spent: 35 minutes  Recommendations for Outpatient Follow-up:  IR follow-up (need to call them to schedule) Continue with hospice See below for instructions for care of biliary drain 4 more days meropenem and then can pull picc    Discharge Diagnoses:  Principal Problem:   Acute gangrenous cholecystitis Active Problems:   Chronic respiratory failure with hypoxia (HCC)   Chronic diastolic CHF (congestive heart failure) (HCC)   Hypokalemia   Essential hypertension   Hypothyroidism   Hyperlipidemia   Obesity, Class III, BMI 40-49.9 (morbid obesity) (HCC)   Cellulitis   Discharge Condition: stable  Diet recommendation: heart healthy  Filed Weights   12/03/22 1051 12/04/22 0420  Weight: 127.5 kg 107.1 kg    History of present illness:  From admission h and p Vernon Campbell is a 82 y.o. male with medical history significant of heart failure, core pulmonale, pulmonary hypertension, COPD, chronic hypoxia, dyslipidemia,  paroxysmal atrial fibrillation, and obesity class 3 who presented with abdominal pain.  Patient reports abdominal pain, right upper quadrant that started about 1 pm yesterday after eating. Since then it has been constant, moderate to severe in intensity, colicky in nature, associated with nausea and vomiting, no radiation, no improving or worsening factors.    He has been a resident from a local skilled nursing facility for the last 3 years. He uses supplemental 02 per [facility] for chronic hypoxia. Endorsed having very poor mobility and over the last year he has been mainly using a scooter for mobility and walker for small distances.    Last hospitalization was in 08/3 to 08/82024 for left hand, left leg and scrotal cellulitis. He was discharged back to the nursing home to continue oral antibiotics for 9 more days to complete 14 day  course, along with palliative care services.    Hospital Course:  Septic shock secondary to gangrenous cholecystitis - Status post percutaneous cholecystostomy by IR on 9/14.  PCCM started IV Zosyn > Meropenem.  Cultures have grown ESBL.  Will plan for total 10 days of Abx  - continue meropenem for 4 more days, can pull picc at end of that With MAP in the low 70s I expressed hesitation about discharge today but patient is adamant he wants discharge today. W  IR's recommendations are as follows Continue flushes with 5 cc NS.  Dressing changes QD or PRN if soiled. Repeat imaging/possible drain injection once output < 10 mL/QD (excluding flush material). Consideration for drain removal if output is < 10 mL/QD (excluding flush material), pending discussion with the providing surgical service.   Percutaneous cholecystostomy drain to remain in place at least 6 weeks.   Recommend fluoroscopy with injection of the drain in IR to evaluate for patency of the cystic duct.  Given that this patient is hospice, he will likely NEVER be a candidate for cholecystectomy, so the drain might can be capped.  If symptoms recur, then place to gravity bag again.  If trial is successful, could consider removing the drain.  If trial in unsuccessful, then patient will need routine exchanges of the  chole tube about every 8-10 weeks.  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  Rate controlled, not on long-term anticoagulation   Acute kidney injury -Creatinine peaked at 2.6 > 2.08. resolved with  fluids   Hypokalemia -As needed repletion   Pulmonary hypertension with cor pulmonale -Supportive care bronchodilators   COPD with chronic hypoxia 3 L nasal cannula stable   Congestive heart failure with preserved EF Moderate to severe AS - Echo in April 2024 showed EF of 65%.   Aldactone, torsemide on hold 2/2 hypotension, currently on midodrine. Consider adding back diuretics as needed   Essential hypertension -Due to low blood pressure, antihypertensives on hold.  Currently on midodrine which may be able to be weaned off as outpatient   Hypothyroidism -Synthroid   Hyperlipidemia - Statin   Chronic Foley in place, changed on 9/15 PT/OT-no further needs.    Procedures: Percutaneous cholescystostomy drain  Consultations: IR, PCCM  Discharge Exam: Vitals:   12/10/22 1130 12/10/22 1133  BP: (!) 92/54 92/61  Pulse: 88 88  Resp:    Temp: 97.8 F (36.6 C)   SpO2: 97% 96%    General exam: Appears calm and comfortable  Respiratory system: rales at bases, normal wob Cardiovascular system: S1 & S2 heard, RRR. Distant heart sounds Gastrointestinal system: Abdomen is distended, non-tender. Ruq drain in place Central nervous system: Alert and oriented. No focal neurological deficits. Extremities: warm, no edema Skin: some bruising on extremities Psychiatry: calm   Discharge Instructions   Discharge Instructions     Diet - low sodium heart healthy   Complete by: As directed    Discharge wound care:   Complete by: As directed    Cleanse wounds to coccyx with VASHE and pat dry. Apply VASHE moist gauze to wound bed and cover with silicone foam.  Change daily.  12/05/22 1915   Increase activity slowly   Complete by: As directed       Allergies as of 12/10/2022   No Known Allergies      Medication List     STOP taking these medications    Calmoseptine 0.44-20.6 % Oint Generic drug: Menthol-Zinc Oxide   feeding supplement (PRO-STAT 64) Liqd   fentaNYL 25 MCG/HR Commonly known as: DURAGESIC   hydrOXYzine 25 MG tablet Commonly known as: ATARAX   ketoconazole 2 % shampoo Commonly known as: NIZORAL   potassium chloride SA 20 MEQ tablet Commonly known as: KLOR-CON M   spironolactone 25 MG tablet Commonly known as: ALDACTONE    spironolactone 50 MG tablet Commonly known as: ALDACTONE   Torsemide 40 MG Tabs   Torsemide 60 MG Tabs   zinc sulfate 220 (50 Zn) MG capsule       TAKE these medications    albuterol (2.5 MG/3ML) 0.083% nebulizer solution Commonly known as: PROVENTIL Take 3 mLs (2.5 mg total) by nebulization every 2 (two) hours as needed for shortness of breath.   ascorbic acid 500 MG tablet Commonly known as: VITAMIN C Take 500 mg by mouth 2 (two) times daily.   atorvastatin 20 MG tablet Commonly known as: LIPITOR Take 1 tablet (20 mg total) by mouth every evening.   fluticasone furoate-vilanterol 100-25 MCG/ACT Aepb Commonly known as: BREO ELLIPTA Inhale 1 puff into the lungs daily.   lactulose 10 GM/15ML solution Commonly known as: CHRONULAC Take 15 mLs by mouth daily.   levothyroxine 175 MCG tablet Commonly known as: SYNTHROID Take 175 mcg by mouth daily before breakfast.   meropenem 1 g in sodium chloride 0.9 % 100 mL Inject 1 g into the vein every 12 (twelve) hours for 4 days.   midodrine 5 MG tablet Commonly known as: PROAMATINE Take 1 tablet (5 mg total)  by mouth 3 (three) times daily with meals.   multivitamin tablet Take 1 tablet by mouth daily.   ondansetron 4 MG disintegrating tablet Commonly known as: ZOFRAN-ODT Take 4 mg by mouth See admin instructions. Give 4 mg every 4 hours as needed for nausea, vomiting for up to 2 doses.   oxyCODONE 5 MG immediate release tablet Commonly known as: Oxy IR/ROXICODONE Take 1 tablet (5 mg total) by mouth every 4 (four) hours as needed for moderate pain. What changed:  medication strength how much to take reasons to take this   OXYGEN Inhale 3 L/min into the lungs continuous.   polyethylene glycol 17 g packet Commonly known as: MIRALAX / GLYCOLAX Take 17 g by mouth daily.   pramipexole 1 MG tablet Commonly known as: MIRAPEX Take 1 tablet (1 mg total) by mouth at bedtime.   senna 8.6 MG Tabs tablet Commonly known  as: SENOKOT Take 17.2 mg by mouth 2 (two) times daily.   tamsulosin 0.4 MG Caps capsule Commonly known as: FLOMAX Take 1 capsule (0.4 mg total) by mouth daily after supper. What changed: when to take this               Discharge Care Instructions  (From admission, onward)           Start     Ordered   12/10/22 0000  Discharge wound care:       Comments: Cleanse wounds to coccyx with VASHE and pat dry. Apply VASHE moist gauze to wound bed and cover with silicone foam.  Change daily.  12/05/22 1915   12/10/22 1411           No Known Allergies  Follow-up Information     Sterling Big, MD Follow up.   Specialties: Interventional Radiology, Radiology Why: call to schedule follow-up for biliary drain management Contact information: [address] [place] [address] [place] [address] [phone]                  The results of significant diagnostics from this hospitalization (including imaging, microbiology, ancillary and laboratory) are listed below for reference.    Significant Diagnostic Studies: [place] EKG SITE RITE  Result Date: 12/09/2022 If Site Rite image not attached, placement could not be confirmed due to current cardiac rhythm.  IR Perc Cholecystostomy  Result Date: 12/04/2022 INDICATION: 82 year old male with severe acute calculus cholecystitis. He is not an operative candidate and presents for percutaneous cholecystostomy tube placement. EXAM: CHOLECYSTOSTOMY MEDICATIONS: In patient currently receiving intravenous antibiotics. No additional antibiotic prophylaxis was administered. ANESTHESIA/SEDATION: Due to hypotension and existing fentanyl patch, only 1 mg Versed was administered for anxiolysis. This was administered by radiology nursing under my supervision. FLUOROSCOPY TIME:  Radiation exposure index: 17 mGy reference air kerma COMPLICATIONS: None immediate. PROCEDURE: Informed written consent was obtained from the patient after a thorough  discussion of the procedural risks, benefits and alternatives. All questions were addressed. Maximal Sterile Barrier Technique was utilized including caps, mask, sterile gowns, sterile gloves, sterile drape, hand hygiene and skin antiseptic. A timeout was performed prior to the initiation of the procedure. The right upper quadrant was interrogated with ultrasound. The gallbladder is identified. A suitable skin entry site was selected and marked. Local anesthesia was attained by infiltration with 1% lidocaine. A small dermatotomy was made. Under real-time ultrasound guidance, a 21 gauge Accustick needle was advanced along a short transhepatic course and into the gallbladder lumen. A 0.018 wire was then coiled in the gallbladder  lumen. The Accustick needle was removed. The transitional dilator was advanced over the wire and into the gallbladder lumen. A an aspirate of cloudy black bile was obtained and sent for Gram stain and culture. A gentle hand injection of contrast material opacifies the gallbladder lumen. A 0.035 Amplatz wire was then coiled in the gallbladder lumen. The transitional dilator was removed. The percutaneous tract was dilated to 10 [place]. A 10 French all-purpose drainage catheter was then advanced over the wire and into the gallbladder. Images were obtained and stored for the medical record. The catheter was flushed and connected to JP bulb suction. The catheter was then secured to the skin with 0 Prolene suture. IMPRESSION: Successful placement of a 10 French transhepatic percutaneous cholecystostomy tube for the indication of acute calculus cholecystitis and a poor operative candidate. Electronically Signed   By: Malachy Moan M.D.   On: 12/04/2022 17:26   DG CHEST PORT 1 VIEW  Result Date: 12/04/2022 CLINICAL DATA:  Central line placement EXAM: PORTABLE CHEST 1 VIEW COMPARISON:  12/03/2022 FINDINGS: Single frontal view of the chest demonstrates left internal jugular catheter tip  overlying superior vena cava. Cardiac silhouette is enlarged. Lung volumes are diminished, with crowding the central vasculature. Stable small left effusion and left basilar atelectasis. No pneumothorax. IMPRESSION: 1. No complication after left internal jugular catheter placement. 2. Low lung volumes, with left basilar atelectasis and small left effusion unchanged. Electronically Signed   By: Sharlet Salina M.D.   On: 12/04/2022 14:53   CT ABDOMEN PELVIS W CONTRAST  Result Date: 12/03/2022 CLINICAL DATA:  Vomiting.  Nausea EXAM: CT ABDOMEN AND PELVIS WITH CONTRAST TECHNIQUE: Multidetector CT imaging of the abdomen and pelvis was performed using the standard protocol following bolus administration of intravenous contrast. RADIATION DOSE REDUCTION: This exam was performed according to the departmental dose-optimization program which includes automated exposure control, adjustment of the mA and/or kV according to patient size and/or use of iterative reconstruction technique. CONTRAST:  OMNIPAQUE IOHEXOL 300 MG/ML  SOLN COMPARISON:  None FINDINGS: Lower chest: Heart is enlarged. There are significant coronary artery calcifications. There also calcifications along the aortic valve. Basilar atelectasis. Tiny effusions. Left-greater-than-right. Hepatobiliary: Markedly enlarged gallbladder with wall thickening, edema. There also areas of poor enhancement along the gallbladder wall. There is also air towards the fundus in the wall itself. Stones are seen dependently as well as a stone in what appears to be the cystic duct on coronal series 5, image 65. Findings are worrisome for developing emphysematous cholecystitis. There are some adjacent reactive nodes identified. Significant inflammatory stranding and fluid. Periportal edema. No space-occupying separate liver lesion.  Patent portal vein. Pancreas: Unremarkable. No pancreatic ductal dilatation or surrounding inflammatory changes. Spleen: Normal in size without  focal abnormality. Adrenals/Urinary Tract: Adrenal glands are preserved. No collecting system dilatation. There is a lower pole slightly exophytic lesion on the right measuring 2.8 cm. Hounsfield unit on portal venous phase of 47. On delayed 58. Indeterminate lesion. Please correlate with any prior or dedicated workup when appropriate. A postcontrast study after clearance of the current contrast may be useful to assess for density without contrast. Also a tiny low-attenuation lesion along the anterior aspect of the left mid kidney as well which is too small to completely characterize. Attention on follow up examination as well. The ureters have normal course and caliber extending down to the bladder. Foley catheter in the underdistended urinary bladder. Stomach/Bowel: No oral contrast. The stomach is relatively collapsed. Third portion duodenal diverticula. Small  bowel is nondilated. Terminal ileal small bowel stool appearance, nonspecific. The large bowel has moderate stool diffusely is nondilated. There is some redundant course of the sigmoid colon with mild wall thickening along the rectosigmoid colon, nonspecific. A subtle colitis is not excluded. Vascular/Lymphatic: Normal caliber aorta and IVC with scattered vascular calcifications along the aorta and branch vessels. Few prominent upper abdominal nodes. No clear pathologically enlarged lymph nodes identified. Reproductive: Prominent prostate. Other: Mesenteric stranding identified. Once again there is fluid in the right upper quadrant surrounding the liver and extending along the right pericolic gutter. Musculoskeletal: Curvature and degenerative changes along the spine. Degenerative changes of the pelvis. Sclerotic focus along the right pubic bone on series 2, image 89. Possibly a bone island but there is a differential. Critical Value/emergent results were called by telephone at the time of interpretation on 12/03/2022 at 11:58 am to provider Pricilla Loveless ,  who verbally acknowledged these results. IMPRESSION: Dilated gallbladder with severe inflammatory changes, wall thickening and fluid. There are areas of poor enhancement along the wall with the air in the wall towards the fundus. Numerous stones including what may be a stone in the cystic duct. Findings are consistent with cholecystitis and with air and poor enhancement of the wall, emphysematous/gangrenous gallbladder. Scattered colonic stool. There is some wall thickening along underdistended sigmoid colon. This could be passive although a subtle colitis is not excluded. Please correlate with any symptoms. Indeterminate right-sided renal lesion. Recommend comparison to prior or follow up study after clearance of the contrast to assess for precontrast density. Lung base opacities with trace pleural fluid, left-greater-than-right. Recommend follow-up. Significant calcifications along the area of the aortic valve. Electronically Signed   By: Karen Kays M.D.   On: 12/03/2022 15:00   DG Chest Portable 1 View  Result Date: 12/03/2022 CLINICAL DATA:  Vomiting EXAM: PORTABLE CHEST 1 VIEW COMPARISON:  X-ray 10/23/2022 FINDINGS: Underinflation. Small left effusion with some adjacent opacity. No pneumothorax. Enlarged cardiopericardial silhouette with vascular congestion. Dense left upper lobe lung nodule consistent with old granulomatous disease. Film is under penetrated. IMPRESSION: Underinflation.  Small left effusion with some adjacent opacity. Enlarged heart with some vascular congestion. Electronically Signed   By: Karen Kays M.D.   On: 12/03/2022 13:11    Microbiology: Recent Results (from the past 240 hour(s))  SARS Coronavirus 2 by RT PCR (hospital order, performed in [facility] hospital lab) *cepheid single result test* Anterior Nasal Swab     Status: None   Collection Time: 12/03/22  3:37 PM   Specimen: Anterior Nasal Swab  Result Value Ref Range Status   SARS Coronavirus 2 by RT PCR NEGATIVE  NEGATIVE Final    Comment: (NOTE) SARS-CoV-2 target nucleic acids are NOT DETECTED.  The SARS-CoV-2 RNA is generally detectable in upper and lower respiratory specimens during the acute phase of infection. The lowest concentration of SARS-CoV-2 viral copies this assay can detect is 250 copies / mL. A negative result does not preclude SARS-CoV-2 infection and should not be used as the sole basis for treatment or other patient management decisions.  A negative result may occur with improper specimen collection / handling, submission of specimen other than nasopharyngeal swab, presence of viral mutation(s) within the areas targeted by this assay, and inadequate number of viral copies (<250 copies / mL). A negative result must be combined with clinical observations, patient history, and epidemiological information.  Fact Sheet for Patients:   RoadLapTop.co.za  Fact Sheet for Healthcare Providers: http://kim-miller.com/  This test  is not yet approved or  cleared by the [place] and has been authorized for detection and/or diagnosis of SARS-CoV-2 by FDA under an Emergency Use Authorization (EUA).  This EUA will remain in effect (meaning this test can be used) for the duration of the COVID-19 declaration under Section 564(b)(1) of the Act, 21 U.S.C. section 360bbb-3(b)(1), unless the authorization is terminated or revoked sooner.  Performed at [facility], [address], [place], [place] [address]   Aerobic/Anaerobic Culture w Gram Stain (surgical/deep wound)     Status: None   Collection Time: 12/04/22 10:52 AM   Specimen: BILE  Result Value Ref Range Status   Specimen Description BILE  Final   Special Requests bile  Final   Gram Stain NO WBC SEEN RARE GRAM NEGATIVE RODS   Final   Culture   Final    ABUNDANT ESCHERICHIA COLI Confirmed Extended Spectrum Beta-Lactamase Producer (ESBL).  In bloodstream infections from ESBL  organisms, carbapenems are preferred over piperacillin/tazobactam. They are shown to have a lower risk of mortality. NO ANAEROBES ISOLATED Performed at [facility], [address], [place], [place] [address]    Report Status 12/09/2022 FINAL  Final   Organism ID, Bacteria ESCHERICHIA COLI  Final      Susceptibility   Escherichia coli - MIC*    AMPICILLIN >=32 RESISTANT Resistant     CEFEPIME >=32 RESISTANT Resistant     CEFTAZIDIME >=64 RESISTANT Resistant     CEFTRIAXONE >=64 RESISTANT Resistant     CIPROFLOXACIN >=4 RESISTANT Resistant     GENTAMICIN <=1 SENSITIVE Sensitive     IMIPENEM 0.5 SENSITIVE Sensitive     TRIMETH/SULFA >=320 RESISTANT Resistant     AMPICILLIN/SULBACTAM >=32 RESISTANT Resistant     PIP/TAZO 8 SENSITIVE Sensitive     * ABUNDANT ESCHERICHIA COLI  MRSA Next Gen by PCR, Nasal     Status: Abnormal   Collection Time: 12/04/22  2:01 PM   Specimen: Nasal Mucosa; Nasal Swab  Result Value Ref Range Status   MRSA by PCR Next Gen DETECTED (A) NOT DETECTED Final    Comment: (NOTE) The GeneXpert MRSA Assay (FDA approved for NASAL specimens only), is one component of a comprehensive MRSA colonization surveillance program. It is not intended to diagnose MRSA infection nor to guide or monitor treatment for MRSA infections. Test performance is not FDA approved in patients less than 82 years old. Performed at [facility], [address] [place], [place] [address]   Remove and replace urinary cath (placed > 5 days) then obtain urine culture from new indwelling urinary catheter.     Status: Abnormal   Collection Time: 12/05/22  3:05 PM   Specimen: Urine, Catheterized  Result Value Ref Range Status   Specimen Description URINE, CATHETERIZED  Final   Special Requests   Final    NONE Performed at [facility], [address], [place], [place] [address]    Culture 30,000 COLONIES/mL PSEUDOMONAS AERUGINOSA (A)  Final   Report Status 12/07/2022 FINAL   Final   Organism ID, Bacteria PSEUDOMONAS AERUGINOSA (A)  Final      Susceptibility   Pseudomonas aeruginosa - MIC*    CEFTAZIDIME 2 SENSITIVE Sensitive     CIPROFLOXACIN <=0.25 SENSITIVE Sensitive     GENTAMICIN 4 SENSITIVE Sensitive     IMIPENEM 1 SENSITIVE Sensitive     PIP/TAZO <=4 SENSITIVE Sensitive     * 30,000 COLONIES/mL PSEUDOMONAS AERUGINOSA     Labs:  Basic Metabolic Panel: Recent Labs  Lab 12/06/22 0350 12/07/22 0516 12/08/22 0734 12/09/22 1345 12/10/22 0612  NA 132* 136 136 139 139  K 3.6 3.2* 3.6 4.0 4.0  CL 93* 95* 98 100 100  CO2 27 28 29 27 29   GLUCOSE 198* 98 116* 115* 118*  BUN 60* 61* 54* 43* 37*  CREATININE 2.57* 2.44* 2.08* 1.48* 1.47*  CALCIUM 7.8* 7.9* 8.5* 8.8* 8.7*  MG 2.1 2.2 2.3 2.1 2.0  PHOS  --   --   --  3.2  --    Liver Function Tests: Recent Labs  Lab 12/04/22 0936 12/04/22 1357 12/05/22 0647 12/09/22 1345 12/10/22 0612  AST 25 26 30 23 21   ALT 18 17 16 19 18   ALKPHOS 143* 209* 155* 165* 175*  BILITOT 3.6* 4.2* 5.3* 2.0* 1.8*  PROT 6.4* 6.3* 6.4* 6.8 6.5  ALBUMIN 1.8* 1.7* 2.5* 2.3* 2.2*   No results for input(s): "LIPASE", "AMYLASE" in the last 168 hours. No results for input(s): "AMMONIA" in the last 168 hours. CBC: Recent Labs  Lab 12/06/22 0350 12/07/22 0516 12/08/22 0734 12/09/22 1345 12/10/22 0612  WBC 22.6* 15.7* 13.1* 13.9* 17.4*  HGB 12.3* 11.8* 13.0 14.1 13.9  HCT 40.4 39.4 43.1 47.2 48.2  MCV 102.0* 100.5* 101.9* 101.9* 103.4*  PLT 179 175 184 181 188   Cardiac Enzymes: No results for input(s): "CKTOTAL", "CKMB", "CKMBINDEX", "TROPONINI" in the last 168 hours. BNP: BNP (last 3 results) Recent Labs    09/27/22 1204 09/28/22 0459 10/23/22 1408  BNP 28.0 53.0 93.0    ProBNP (last 3 results) No results for input(s): "PROBNP" in the last 8760 hours.  CBG: Recent Labs  Lab 12/10/22 0606  GLUCAP 120*       Signed:  Silvano Bilis MD.  [facility] 12/10/2022, 2:12 PM

## 2022-12-10 NOTE — TOC Transition Note (Signed)
Transition of Care [facility]) - CM/SW Discharge Note   Patient Details  Name: Daytron Gulan MRN: [id number] Date of Birth: [birth year]  Transition of Care [facility]) CM/SW Contact:  Brekyn Huntoon A [place], Theresia Majors Phone Number: 12/10/2022, 5:10 PM   Clinical Narrative:     Patient will DC to: [facility]  Anticipated DC date: 12/10/22  Family notified: Remer Macho, Montez Hageman.   Transport by: Sharin Mons      Per MD patient ready for DC to [facility] . RN, patient, patient's family, and facility notified of DC. Discharge Summary and FL2 sent to facility. RN to call report prior to discharge [phone], ). DC packet on chart. Ambulance transport requested for patient.     CSW will sign off for now as social work intervention is no longer needed. Please consult [place] again if new needs arise.   Final next level of care: Skilled Nursing Facility Barriers to Discharge: Barriers Resolved   Patient Goals and CMS Choice      Discharge Placement                Patient chooses bed at: [facility] Patient to be transferred to facility by: [facility] Name of family member notified: [employer]. Patient and family notified of of transfer: 12/10/22  Discharge Plan and Services Additional resources added to the After Visit Summary for   In-house Referral: Clinical Social Work Discharge Planning Services: CM Consult Post Acute Care Choice: Skilled Nursing Facility                               Social Determinants of Health (SDOH) Interventions SDOH Screenings   Food Insecurity: No Food Insecurity (10/23/2022)  Housing: Low Risk  (10/23/2022)  Transportation Needs: No Transportation Needs (10/23/2022)  Utilities: Not At Risk (10/23/2022)  Social Connections: Unknown (07/31/2021)   Received from [facility], [facility]  Tobacco Use: Medium Risk (12/03/2022)     Readmission Risk Interventions    12/06/2022   11:58 AM 10/24/2022    3:34 PM 09/28/2022    2:05 PM  Readmission Risk Prevention Plan   Transportation Screening Complete Complete Complete  PCP or Specialist Appt within 3-5 Days  Complete Complete  HRI or Home Care Consult  Complete Complete  Social Work Consult for Recovery Care Planning/Counseling  Complete Complete  Palliative Care Screening  Complete Not Applicable  Medication Review [employer]) Referral to Pharmacy Complete Complete  HRI or Home Care Consult Complete    SW Recovery Care/Counseling Consult Complete    Skilled Nursing Facility Complete

## 2022-12-10 NOTE — Progress Notes (Signed)
Report called Roxanne, LPN @ [facility] @ 19:45pm.

## 2022-12-10 NOTE — Progress Notes (Signed)
Peripherally Inserted Central Catheter Placement  The IV Nurse has discussed with the patient and/or persons authorized to consent for the patient, the purpose of this procedure and the potential benefits and risks involved with this procedure.  The benefits include less needle sticks, lab draws from the catheter, and the patient may be discharged home with the catheter. Risks include, but not limited to, infection, bleeding, blood clot (thrombus formation), and puncture of an artery; nerve damage and irregular heartbeat and possibility to perform a PICC exchange if needed/ordered by physician.  Alternatives to this procedure were also discussed.  Bard Power PICC patient education guide, fact sheet on infection prevention and patient information card has been provided to patient /or left at bedside.    PICC Placement Documentation  PICC Single Lumen 12/10/22 Right Brachial 41 cm 0 cm (Active)  Indication for Insertion or Continuance of Line Home intravenous therapies (PICC only) 12/10/22 1049  Exposed Catheter (cm) 0 cm 12/10/22 1049  Site Assessment Clean, Dry, Intact 12/10/22 1049  Line Status Flushed;Blood return noted;Saline locked 12/10/22 1049  Dressing Type Transparent;Securing device 12/10/22 1049  Dressing Status Antimicrobial disc in place 12/10/22 1049  Line Adjustment (NICU/IV Team Only) No 12/10/22 1049  Dressing Change Due 12/17/22 12/10/22 1049       [name] 12/10/2022, 10:53 AM

## 2022-12-10 NOTE — Plan of Care (Signed)

## 2022-12-10 NOTE — Progress Notes (Signed)
Paper copy of ECG ultra sound guided placement of PICC placed on the chart.

## 2022-12-10 NOTE — Progress Notes (Addendum)
Patient was picked up by ambulance and left with all of his personal belongings including his eye glasses, cell phone and charger, and personal care items. Antibiotic is in discharge packet that was picked up by ambulance team.    Ciarrah Rae

## 2022-12-21 DEATH — deceased

## 2023-01-13 DIAGNOSIS — R262 Difficulty in walking, not elsewhere classified: Secondary | ICD-10-CM | POA: Insufficient documentation

## 2023-01-13 NOTE — Progress Notes (Deleted)
Name: Vernon Campbell DOB: 11/10/1940 MRN: [id number]  History of Present Illness: Mr. Forker is a 82 y.o. male who presents today for follow up visit at [facility]. He is accompanied by *** (***transport staff from [facility]).  - GU history: 1. BPH with LUTS (frequency, nocturia, urgency). - ***Taking Flomax 0.4 mg daily.  - Exacerbated by diuretic use for CHF management with bilateral lower extremity lymphedema. Uses compression therapy. 2. Prior episode of penoscrotal cellulitis in early July 2024.    Recent history: > 09/22/2022: Urology office visit for penoscrotal cellulitis. Treated with Bactrim DS 2x/day x7 days.   > 09/27/2022 - 10/04/2022: Admitted for CHF exacerbation. Antibiotic switched to Doxycycline for penoscrotal cellulitis.  > 10/14/2022: Urology office visit. Doing well.  Since last visit: > 10/23/2022 - 10/28/2022: Readmitted for treatment of cellulitis involving multiple sites including right lower extremity as well as left hand and scrotal area. Treated with Doxycycline x14 days. "He continues to remain a high risk for readmission and may require future ID evaluation consultation if cellulitis continues to persist."  > 12/03/2022 - 12/10/2022: Admitted for septic shock secondary to acute gangrenous cholecystitis. Treated with Meropenum x10 days via PICC line. Percutaneous cholecystostomy drain placed. "Given that this patient is hospice, he will likely NEVER be a candidate for cholecystectomy, so the drain might can be capped."  Today: He reports ***  ***now has indwelling Foley catheter - when / where / why was that placed? At SNF or during one of his recent hospital admissions?   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   He reports the catheter is draining ***.  It was last exchanged ***.  He {Actions; denies-reports:120008} gross hematuria.  He {Actions; denies-reports:120008}  flank pain. He {Actions; denies-reports:120008} abdominal pain.  He {Actions; denies-reports:120008} fevers. He {Actions; denies-reports:120008} nausea/vomiting.   Fall Screening: Do you usually have a device to assist in your mobility? Yes - wheelchair   Medications: Current Outpatient Medications  Medication Sig Dispense Refill   albuterol (PROVENTIL) (2.5 MG/3ML) 0.083% nebulizer solution Take 3 mLs (2.5 mg total) by nebulization every 2 (two) hours as needed for shortness of breath. 75 mL 12   ascorbic acid (VITAMIN C) 500 MG tablet Take 500 mg by mouth 2 (two) times daily.     atorvastatin (LIPITOR) 20 MG tablet Take 1 tablet (20 mg total) by mouth every evening.     fluticasone furoate-vilanterol (BREO ELLIPTA) 100-25 MCG/ACT AEPB Inhale 1 puff into the lungs daily. 28 each 3   lactulose (CHRONULAC) 10 GM/15ML solution Take 15 mLs by mouth daily.     levothyroxine (SYNTHROID) 175 MCG tablet Take 175 mcg by mouth daily before breakfast.     midodrine (PROAMATINE) 5 MG tablet Take 1 tablet (5 mg total) by mouth 3 (three) times daily with meals.     Multiple Vitamin (MULTIVITAMIN) tablet Take 1 tablet by mouth daily.     ondansetron (ZOFRAN-ODT) 4 MG disintegrating tablet Take 4 mg by mouth See admin instructions. Give 4 mg every 4 hours as needed for nausea, vomiting for up to 2 doses.     oxyCODONE (OXY IR/ROXICODONE) 5 MG immediate release tablet Take 1 tablet (5 mg total) by mouth every 4 (four) hours as needed for moderate pain. 30 tablet 0   OXYGEN Inhale 3 L/min into the lungs continuous.     polyethylene glycol (MIRALAX / GLYCOLAX) 17 g packet Take 17 g by mouth  daily.     pramipexole (MIRAPEX) 1 MG tablet Take 1 tablet (1 mg total) by mouth at bedtime. 10 tablet 0   senna (SENOKOT) 8.6 MG TABS tablet Take 17.2 mg by mouth 2 (two) times daily.     tamsulosin (FLOMAX) 0.4 MG CAPS capsule Take 1 capsule (0.4 mg total) by mouth daily after supper. (Patient taking differently: Take  0.4 mg by mouth at bedtime.) 30 capsule 11   No current facility-administered medications for this visit.    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, bilateral    Constipation, chronic    Dermatochalasis of eyelid    Extreme obesity with alveolar hypoventilation (HCC)    History of tobacco use    Hyperlipidemia    Hypermetropia, bilateral    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)    Hypokalemia    Hypothyroidism, adult    Ileus (HCC)    Leg pain, diffuse    Lymphedema    Muscle weakness (generalized)    Obstructive sleep apnea syndrome    Osteoarthritis, unspecified osteoarthritis type, unspecified site    Other seborrheic keratosis    Paroxysmal atrial fibrillation (HCC)    Peripheral venous insufficiency    Pulmonary hypertension (HCC)    Recurrent major depression (HCC)    Restless leg syndrome    Vitamin D deficiency, unspecified    Vitreous degeneration of left eye    Past Surgical History:  Procedure Laterality Date   IR PERC CHOLECYSTOSTOMY  12/04/2022   No family history on file. Social History   Socioeconomic History   Marital status: Single    Spouse name: Not on file   Number of children: Not on file   Years of education: Not on file   Highest education level: Not on file  Occupational History   Not on file  Tobacco Use   Smoking status: Former    Types: Cigarettes   Smokeless tobacco: Never  Vaping Use   Vaping status: Never Used  Substance and Sexual Activity   Alcohol use: Not Currently   Drug use: Not Currently   Sexual activity: Not Currently   Other Topics Concern   Not on file  Social History Narrative   Not on file   Social Determinants of Health   Financial Resource Strain: Not on file  Food Insecurity: No Food Insecurity (10/23/2022)   Hunger Vital Sign    Worried About Running Out of Food in the Last Year: Never true    Ran Out of Food in the Last Year: Never true  Transportation Needs: No Transportation Needs (10/23/2022)   PRAPARE - Administrator, Civil Service (Medical): No    Lack of Transportation (Non-Medical): No  Physical Activity: Not on file  Stress: Not on file  Social Connections: Unknown (07/31/2021)   Received from [employer], [facility]   Social Network    Social Network: Not on file  Intimate Partner Violence: Not At Risk (10/23/2022)   Humiliation, Afraid, Rape, and Kick questionnaire    Fear of Current  or Ex-Partner: No    Emotionally Abused: No    Physically Abused: No    Sexually Abused: No    Review of Systems*** Constitutional: Patient denies any unintentional weight loss or change in strength lntegumentary: Patient denies any rashes or pruritus Eyes: Patient denies ***dry eyes ENT: Patient ***denies dry mouth Cardiovascular: Patient denies chest pain or syncope Respiratory: Patient denies shortness of breath Gastrointestinal: Patient ***denies nausea, vomiting, constipation, or diarrhea Musculoskeletal: Patient denies muscle cramps or weakness Neurologic: Patient denies convulsions or seizures Allergic/Immunologic: Patient denies recent allergic reaction(s) Hematologic/Lymphatic: Patient denies bleeding tendencies Endocrine: Patient denies heat/cold intolerance  GU: As per HPI.  OBJECTIVE There were no vitals filed for this visit. There is no height or weight on file to calculate BMI.  Physical Examination*** Constitutional: No obvious distress; patient is non-toxic appearing  Cardiovascular: No visible lower extremity edema.  Respiratory: The patient does not  have audible wheezing/stridor; respirations do not appear labored  Gastrointestinal: Abdomen non-distended Musculoskeletal: Normal ROM of UEs  Skin: No obvious rashes/open sores  Neurologic: CN 2-12 grossly intact Psychiatric: Answered questions appropriately with normal affect  Hematologic/Lymphatic/Immunologic: No obvious bruises or sites of spontaneous bleeding  UA: ***negative *** WBC/hpf, *** RBC/hpf, bacteria (***) PVR: *** ml  ASSESSMENT No diagnosis found. ***  Will plan for follow up in *** months / ***1 year or sooner if needed. Pt verbalized understanding and agreement. All questions were answered.  PLAN Advised the following: 1. *** 2. ***No follow-ups on file.  No orders of the defined types were placed in this encounter.   It has been explained that the patient is to follow regularly with their PCP in addition to all other providers involved in their care and to follow instructions provided by these respective offices. Patient advised to contact urology clinic if any urologic-pertaining questions, concerns, new symptoms or problems arise in the interim period.  There are no Patient Instructions on file for this visit.  Electronically signed by:  Donnita Falls, FNP   01/13/23    10:00 AM

## 2023-01-19 ENCOUNTER — Ambulatory Visit (HOSPITAL_COMMUNITY): Admission: RE | Admit: 2023-01-19 | Payer: Medicaid Other | Source: Ambulatory Visit

## 2023-01-20 ENCOUNTER — Ambulatory Visit: Payer: Medicaid Other | Admitting: Urology

## 2023-01-20 DIAGNOSIS — N400 Enlarged prostate without lower urinary tract symptoms: Secondary | ICD-10-CM

## 2023-01-20 DIAGNOSIS — R262 Difficulty in walking, not elsewhere classified: Secondary | ICD-10-CM

## 2023-01-20 DIAGNOSIS — Z978 Presence of other specified devices: Secondary | ICD-10-CM

## 2023-01-21 DEATH — deceased

## 2023-02-01 ENCOUNTER — Ambulatory Visit (HOSPITAL_COMMUNITY): Payer: Medicaid Other

## 2023-09-05 ENCOUNTER — Ambulatory Visit: Payer: Medicare Other | Admitting: Urology

## 2024-04-25 ENCOUNTER — Telehealth: Payer: Self-pay

## 2024-04-25 NOTE — Telephone Encounter (Signed)
 SABRA
